# Patient Record
Sex: Female | Born: 1955 | Race: White | Hispanic: No | Smoking: Current every day smoker
Health system: Southern US, Community
[De-identification: ages and names within clinical notes are randomized; demographics above are authoritative.]

## PROBLEM LIST (undated history)

## (undated) DIAGNOSIS — I10 Essential (primary) hypertension: Secondary | ICD-10-CM

## (undated) DIAGNOSIS — T4145XA Adverse effect of unspecified anesthetic, initial encounter: Secondary | ICD-10-CM

## (undated) DIAGNOSIS — M1990 Unspecified osteoarthritis, unspecified site: Secondary | ICD-10-CM

## (undated) DIAGNOSIS — F32A Depression, unspecified: Secondary | ICD-10-CM

## (undated) DIAGNOSIS — F329 Major depressive disorder, single episode, unspecified: Secondary | ICD-10-CM

## (undated) DIAGNOSIS — F419 Anxiety disorder, unspecified: Secondary | ICD-10-CM

## (undated) DIAGNOSIS — R112 Nausea with vomiting, unspecified: Secondary | ICD-10-CM

## (undated) DIAGNOSIS — Z9889 Other specified postprocedural states: Secondary | ICD-10-CM

## (undated) DIAGNOSIS — T8859XA Other complications of anesthesia, initial encounter: Secondary | ICD-10-CM

## (undated) HISTORY — PX: KNEE SURGERY: SHX244

## (undated) HISTORY — PX: FOOT NEUROMA SURGERY: SHX646

---

## 2001-02-04 ENCOUNTER — Encounter (HOSPITAL_COMMUNITY): Admission: RE | Admit: 2001-02-04 | Discharge: 2001-03-06 | Payer: Self-pay | Admitting: Preventative Medicine

## 2001-02-26 ENCOUNTER — Encounter: Payer: Self-pay | Admitting: Family Medicine

## 2001-02-26 ENCOUNTER — Ambulatory Visit (HOSPITAL_COMMUNITY): Admission: RE | Admit: 2001-02-26 | Discharge: 2001-02-26 | Payer: Self-pay | Admitting: Family Medicine

## 2002-03-31 ENCOUNTER — Ambulatory Visit (HOSPITAL_COMMUNITY): Admission: RE | Admit: 2002-03-31 | Discharge: 2002-03-31 | Payer: Self-pay | Admitting: Obstetrics and Gynecology

## 2002-03-31 ENCOUNTER — Encounter: Payer: Self-pay | Admitting: Obstetrics and Gynecology

## 2002-05-29 ENCOUNTER — Other Ambulatory Visit: Admission: RE | Admit: 2002-05-29 | Discharge: 2002-05-29 | Payer: Self-pay | Admitting: Unknown Physician Specialty

## 2002-07-14 ENCOUNTER — Encounter: Payer: Self-pay | Admitting: Family Medicine

## 2002-07-14 ENCOUNTER — Ambulatory Visit (HOSPITAL_COMMUNITY): Admission: RE | Admit: 2002-07-14 | Discharge: 2002-07-14 | Payer: Self-pay | Admitting: Family Medicine

## 2005-11-16 ENCOUNTER — Ambulatory Visit (HOSPITAL_COMMUNITY): Admission: RE | Admit: 2005-11-16 | Discharge: 2005-11-16 | Payer: Self-pay | Admitting: Obstetrics and Gynecology

## 2006-01-08 ENCOUNTER — Ambulatory Visit (HOSPITAL_COMMUNITY): Admission: RE | Admit: 2006-01-08 | Discharge: 2006-01-08 | Payer: Self-pay | Admitting: Obstetrics & Gynecology

## 2006-03-28 ENCOUNTER — Ambulatory Visit (HOSPITAL_COMMUNITY): Admission: RE | Admit: 2006-03-28 | Discharge: 2006-03-28 | Payer: Self-pay | Admitting: Obstetrics & Gynecology

## 2006-03-28 ENCOUNTER — Encounter (INDEPENDENT_AMBULATORY_CARE_PROVIDER_SITE_OTHER): Payer: Self-pay | Admitting: Specialist

## 2006-08-31 ENCOUNTER — Encounter (INDEPENDENT_AMBULATORY_CARE_PROVIDER_SITE_OTHER): Payer: Self-pay | Admitting: Internal Medicine

## 2006-08-31 ENCOUNTER — Other Ambulatory Visit: Admission: RE | Admit: 2006-08-31 | Discharge: 2006-08-31 | Payer: Self-pay | Admitting: Internal Medicine

## 2006-12-14 ENCOUNTER — Ambulatory Visit (HOSPITAL_COMMUNITY): Admission: RE | Admit: 2006-12-14 | Discharge: 2006-12-14 | Payer: Self-pay | Admitting: Internal Medicine

## 2006-12-14 ENCOUNTER — Encounter: Payer: Self-pay | Admitting: Orthopedic Surgery

## 2006-12-21 ENCOUNTER — Ambulatory Visit (HOSPITAL_COMMUNITY): Admission: RE | Admit: 2006-12-21 | Discharge: 2006-12-21 | Payer: Self-pay | Admitting: Obstetrics and Gynecology

## 2007-01-17 ENCOUNTER — Ambulatory Visit: Payer: Self-pay | Admitting: Orthopedic Surgery

## 2007-01-17 DIAGNOSIS — IMO0002 Reserved for concepts with insufficient information to code with codable children: Secondary | ICD-10-CM | POA: Insufficient documentation

## 2007-01-17 DIAGNOSIS — M1710 Unilateral primary osteoarthritis, unspecified knee: Secondary | ICD-10-CM | POA: Insufficient documentation

## 2007-01-17 DIAGNOSIS — Z8679 Personal history of other diseases of the circulatory system: Secondary | ICD-10-CM | POA: Insufficient documentation

## 2007-07-18 ENCOUNTER — Inpatient Hospital Stay (HOSPITAL_COMMUNITY): Admission: EM | Admit: 2007-07-18 | Discharge: 2007-07-19 | Payer: Self-pay | Admitting: Emergency Medicine

## 2008-04-10 ENCOUNTER — Other Ambulatory Visit: Admission: RE | Admit: 2008-04-10 | Discharge: 2008-04-10 | Payer: Self-pay | Admitting: Obstetrics and Gynecology

## 2009-05-19 ENCOUNTER — Ambulatory Visit (HOSPITAL_COMMUNITY): Admission: RE | Admit: 2009-05-19 | Discharge: 2009-05-19 | Payer: Self-pay | Admitting: Orthopaedic Surgery

## 2009-06-02 ENCOUNTER — Other Ambulatory Visit: Admission: RE | Admit: 2009-06-02 | Discharge: 2009-06-02 | Payer: Self-pay | Admitting: Obstetrics and Gynecology

## 2010-02-27 ENCOUNTER — Encounter: Payer: Self-pay | Admitting: Obstetrics and Gynecology

## 2010-06-24 NOTE — Op Note (Signed)
[name], [name]           ACCOUNT NO.:  [id number]   MEDICAL RECORD NO.:  [id number]          PATIENT TYPE:  AMB   LOCATION:  DAY                           FACILITY:  [facility]   PHYSICIAN:  Lazaro Arms, M.D.   DATE OF BIRTH:  [birth year]   DATE OF PROCEDURE:  03/28/2006  DATE OF DISCHARGE:                               OPERATIVE REPORT   PREOPERATIVE DIAGNOSES:  1. Menometrorrhagia.  2. Dysmenorrhea.   POSTOPERATIVE DIAGNOSES:  1. Menometrorrhagia.  2. Dysmenorrhea.   PROCEDURE:  Hysteroscopy, dilatation and curettage, __________ ablation.   SURGEON:  Lazaro Arms, M.D.   ANESTHESIA:  Saddle block anesthesia.   FINDINGS:  The patient had some rough endometrial tissue.  It was maybe  small submucosal myomas, very small.  Maybe polypoid tissue, but it was  almost fibrotic.  Otherwise the endometrial cavity was normal.   DESCRIPTION OF PROCEDURE:  The patient was taken to the operating room  and placed in the sitting position where she underwent a saddle block.  She was placed in the dorsal lithotomy position and prepped and draped  in the usual sterile fashion.  A Graves speculum was placed.  The cervix  was grasped with a single-tooth tenaculum.  Her cervix was dilated  serially to allow passage of the hysteroscope.  A hysteroscopy was  performed with the above-noted findings.  A vigorous uterine curettage  was performed and all the endometrial curettings were sent to pathology  for evaluation. There was good endometrial cry at the end of the  procedure.  A ThermaChoice-3 endometrial ablation balloon was used and 9  mL of D-5-W was required to maintain a pressure of 192 mmHg.  It was  heated to 88 degrees Celsius.  The total therapy time was eight minutes  and 55 seconds.  All the fluid was returned at the end of the procedure.   The patient tolerated the procedure well. She experienced minimal blood  loss.  She was taken to the recovery room in good stable  condition.  All  counts were correct.     Lazaro Arms, M.D.  Electronically Signed    LHE/MEDQ  D:  03/28/2006  T:  03/28/2006  Job:  [id number]

## 2010-06-24 NOTE — Discharge Summary (Signed)
[name], [name]           ACCOUNT NO.:  [id number]   MEDICAL RECORD NO.:  [id number]          PATIENT TYPE:  INP   LOCATION:  3703                         FACILITY:  [facility]   PHYSICIAN:  Dani Gobble, MD       DATE OF BIRTH:  10/04/1955   DATE OF ADMISSION:  07/18/2007  DATE OF DISCHARGE:  07/19/2007                               DISCHARGE SUMMARY   PRIMARY CARE Jarelle Ates:  Dr. Nani Skillern.   DISCHARGE DIAGNOSES:  1. Chest pain felt to be noncardiac in origin.  2. Hypertension.  3. Chronic obstructive pulmonary disease.  4. Tobacco use.  5. Degenerative joint disease.   DISCHARGE MEDICATIONS:  1. Lopressor 25 mg p.o. b.i.d.  2. Norvasc 5 mg p.o. daily.  3. Protonix 40 mg p.o. daily.  4. Xanax p.r.n. as directed.  5. Aspirin 81 mg daily.  6. Nitroglycerin sublingual p.r.n.   HISTORY OF PRESENT ILLNESS:  For full details, please refer to separate  admission history and physical examination.  Briefly, the patient is a  55 year old Caucasian female without prior history of coronary artery  disease.  She was referred by Dr. Rolm Baptise office for evaluation of  substernal chest pain/tightness for several days.  Her pain was  primarily after eating, had been located in the epigastric area with  associated nausea and vomiting, but no other associated signs or  symptoms.   HOSPITAL COURSE:  The patient was admitted to Cardiac Telemetry where  she did rule out for myocardial infarction.  She did remain pain-free.  Given the possible gastrointestinal etiology of her symptoms, abdominal  ultrasound was obtained to rule out cholecystitis.  There was no acute  abnormality.  There was simple cyst at the upper pole of the left kidney  and diffuse fatty infiltrate of the liver.  The patient remained stable  during her hospital course.  It was felt since she is ruled out for  myocardial infarction, her symptoms were atypical, and she had no prior  cardiac history that she could be  discharged home with outpatient  followup for nuclear stress test.  This was discussed with the patient,  she was in agreement.   LABORATORY VALUES DURING HOSPITAL COURSE:  Cardiac enzymes negative x4  sets.  TSH 1.889.  Urinalysis was negative.  Lipase 23 and amylase 62.  Metabolic panel with sodium 141, potassium 4.0, chloride 107, CO2 of 26,  glucose 104, BUN 9, and creatinine 0.65.  CBC with hemoglobin 13.3,  hematocrit 38.3, WBC 8.7, and platelets 304.   DISCHARGE INSTRUCTIONS:  The patient was given instructions regarding  her medication adjustments as made above.  She has been also instructed  to return to the emergency department as needed for any further chest  pain or complaints.  She will follow up in our office with Dr. Domingo Sep  in [place] and will also have a stress test  completed on July 25, 2007, at 8:30 a.m.  She will follow up with her  primary care Manuel Dall.  I recommended absolute tobacco cessation.   CONDITION ON DISCHARGE:  Stable. Improved.      Kandis Mannan, PA  ______________________________  Dani Gobble, MD    ALT/MEDQ  D:  08/16/2007  T:  08/17/2007  Job:  [id number]   cc:   Dr. Nani Skillern

## 2010-11-03 LAB — URINALYSIS, ROUTINE W REFLEX MICROSCOPIC
Glucose, UA: NEGATIVE
Hgb urine dipstick: NEGATIVE
Protein, ur: NEGATIVE
Specific Gravity, Urine: 1.008
Urobilinogen, UA: 0.2

## 2010-11-03 LAB — PROTIME-INR
INR: 1
Prothrombin Time: 13.1

## 2010-11-03 LAB — CK TOTAL AND CKMB (NOT AT ARMC)
CK, MB: 0.7
Relative Index: INVALID
Relative Index: INVALID
Total CK: 37
Total CK: 51

## 2010-11-03 LAB — COMPREHENSIVE METABOLIC PANEL
ALT: 13
AST: 13
Alkaline Phosphatase: 66
BUN: 9
Calcium: 9.2
Creatinine, Ser: 0.65
GFR calc Af Amer: >60
Potassium: 4
Sodium: 141
Total Bilirubin: 0.5

## 2010-11-03 LAB — CBC
HCT: 38.3
Hemoglobin: 13.3
MCHC: 34.6
MCV: 91.2
Platelets: 304
RBC: 4.21
RDW: 12.4

## 2010-11-03 LAB — POCT CARDIAC MARKERS: CKMB, poc: 1.1

## 2010-11-03 LAB — DIFFERENTIAL
Eosinophils Relative: 2
Lymphocytes Relative: 34
Neutro Abs: 5.3

## 2010-11-03 LAB — LIPASE, BLOOD: Lipase: 23

## 2010-11-03 LAB — TSH: TSH: 1.889

## 2010-11-03 LAB — APTT: aPTT: 27

## 2012-02-29 ENCOUNTER — Encounter (HOSPITAL_COMMUNITY): Payer: Self-pay | Admitting: Emergency Medicine

## 2012-02-29 ENCOUNTER — Emergency Department (HOSPITAL_COMMUNITY)
Admission: EM | Admit: 2012-02-29 | Discharge: 2012-02-29 | Disposition: A | Discharge status: Home / Self Care | Payer: Worker's Compensation | Attending: Emergency Medicine | Admitting: Emergency Medicine

## 2012-02-29 ENCOUNTER — Emergency Department (HOSPITAL_COMMUNITY): Payer: Worker's Compensation

## 2012-02-29 DIAGNOSIS — I10 Essential (primary) hypertension: Secondary | ICD-10-CM | POA: Insufficient documentation

## 2012-02-29 DIAGNOSIS — F17200 Nicotine dependence, unspecified, uncomplicated: Secondary | ICD-10-CM | POA: Insufficient documentation

## 2012-02-29 DIAGNOSIS — Y9289 Other specified places as the place of occurrence of the external cause: Secondary | ICD-10-CM | POA: Insufficient documentation

## 2012-02-29 DIAGNOSIS — W208XXA Other cause of strike by thrown, projected or falling object, initial encounter: Secondary | ICD-10-CM | POA: Insufficient documentation

## 2012-02-29 DIAGNOSIS — Y939 Activity, unspecified: Secondary | ICD-10-CM | POA: Insufficient documentation

## 2012-02-29 DIAGNOSIS — Z79899 Other long term (current) drug therapy: Secondary | ICD-10-CM | POA: Insufficient documentation

## 2012-02-29 DIAGNOSIS — S8000XA Contusion of unspecified knee, initial encounter: Secondary | ICD-10-CM

## 2012-02-29 HISTORY — DX: Essential (primary) hypertension: I10

## 2012-02-29 MED ORDER — OXYCODONE-ACETAMINOPHEN 5-325 MG PO TABS: 1.0000 | ORAL_TABLET | Freq: Four times a day (QID) | ORAL | Status: AC | PRN

## 2012-02-29 MED ORDER — HYDROCODONE-ACETAMINOPHEN 5-325 MG PO TABS: 1.0000 | ORAL_TABLET | Freq: Four times a day (QID) | ORAL | Status: DC | PRN

## 2012-02-29 MED ORDER — IBUPROFEN 800 MG PO TABS: 800.0000 mg | ORAL_TABLET | Freq: Three times a day (TID) | ORAL | Status: DC

## 2012-02-29 NOTE — ED Notes (Signed)
Pt c/o left leg pain after several plastic totes fell on it at work this am.

## 2012-02-29 NOTE — ED Provider Notes (Signed)
History   This chart was scribed for Benny Lennert, MD, by Frederik Pear, ER scribe. The patient was seen in room [facility] and the patient's care was started at 0920.    CSN: [id number]  Arrival date & time 02/29/12  0906   First MD Initiated Contact with Patient 02/29/12 0920      Chief Complaint  Patient presents with  . Leg Pain    (Consider location/radiation/quality/duration/timing/severity/associated sxs/prior treatment) Patient is a 57 y.o. female presenting with leg pain. The history is provided by the patient. No language interpreter was used.  Leg Pain  The incident occurred less than 1 hour ago. The incident occurred at work. The pain is present in the left leg. The pain is moderate. The symptoms are aggravated by bearing weight.    [name] is a 57 y.o. female who presents to the Emergency Department complaining of sudden onset, moderate, constant left lower leg pain that is aggravated by bearing weight that began after several plastic totes of merchandise fell on her leg while she was at work [employer].   Past Medical History  Diagnosis Date  . Hypertension     Past Surgical History  Procedure Date  . Knee surgery     No family history on file.  History  Substance Use Topics  . Smoking status: Current Every Day Smoker -- 0.5 packs/day  . Smokeless tobacco: Not on file  . Alcohol Use: Yes     Comment: occasional    OB History    Grav Para Term Preterm Abortions TAB SAB Ect Mult Living                  Review of Systems  Constitutional: Negative for fatigue.  HENT: Negative for congestion, sinus pressure and ear discharge.   Eyes: Negative for discharge.  Respiratory: Negative for cough.   Cardiovascular: Negative for chest pain.  Gastrointestinal: Negative for abdominal pain and diarrhea.  Genitourinary: Negative for frequency and hematuria.  Musculoskeletal: Negative for back pain.       Leg pain.  Skin: Negative for rash.    Neurological: Negative for seizures and headaches.  Hematological: Negative.   Psychiatric/Behavioral: Negative for hallucinations.  All other systems reviewed and are negative.    Allergies  Review of patient's allergies indicates no known allergies.  Home Medications   Current Outpatient Rx  Name  Route  Sig  Dispense  Refill  . ALPRAZOLAM 1 MG PO TABS   Oral   Take 1 mg by mouth 4 (four) times daily as needed. Sleep, Anxiety         . AMLODIPINE BESYLATE 5 MG PO TABS   Oral   Take 5 mg by mouth daily.         . B COMPLEX PO TABS   Oral   Take 1 tablet by mouth daily.         Marland Kitchen CITALOPRAM HYDROBROMIDE 20 MG PO TABS   Oral   Take 20 mg by mouth daily.         Marland Kitchen METOPROLOL TARTRATE 25 MG PO TABS   Oral   Take 25 mg by mouth 2 (two) times daily.         Marland Kitchen NAPROXEN SODIUM 220 MG PO TABS   Oral   Take 220 mg by mouth 2 (two) times daily as needed. Arthritis         . ZOLPIDEM TARTRATE 10 MG PO TABS   Oral   Take  10 mg by mouth at bedtime as needed. Sleep           BP 141/84  Pulse 76  Temp 98 F (36.7 C)  Resp 18  Ht 5\' 1"  (1.549 m)  Wt 174 lb (78.926 kg)  BMI 32.88 kg/m2  SpO2 97%  Physical Exam  Nursing note and vitals reviewed. Constitutional: She is oriented to person, place, and time. She appears well-developed.  HENT:  Head: Normocephalic.  Eyes: Conjunctivae normal are normal.  Neck: No tracheal deviation present.  Cardiovascular:  No murmur heard. Musculoskeletal: Normal range of motion.       She has ecchymosis over her lateral tibia and fibula.  Neurological: She is oriented to person, place, and time.  Skin: Skin is warm.  Psychiatric: She has a normal mood and affect.    ED Course  Procedures (including critical care time)  DIAGNOSTIC STUDIES: Oxygen Saturation is 97% on room air, adequate by my interpretation.    COORDINATION OF CARE:  09:44- Discussed planned course of treatment with the patient, including a left  tibia/fibula X-ray, who is agreeable at this time.  Labs Reviewed - No data to display Dg Tibia/fibula Left  02/29/2012  *RADIOLOGY REPORT*  Clinical Data: History of trauma complaining of pain in the left lower leg.  LEFT TIBIA AND FIBULA - 2 VIEW  Comparison: No priors.  Findings: AP lateral views of the left tibia and fibula demonstrate no acute displaced fracture or soft tissue abnormality.  IMPRESSION: 1.  No acute radiographic abnormality of the left tibia or fibula.   Original Report Authenticated By: Trudie Reed, M.D.      No diagnosis found.    MDM   The chart was scribed for me under my direct supervision.  I personally performed the history, physical, and medical decision making and all procedures in the evaluation of this patient.Benny Lennert, MD 02/29/12 1105

## 2012-02-29 NOTE — ED Notes (Signed)
Bruising to left lower lateral leg after totes fell on the leg

## 2012-09-12 ENCOUNTER — Other Ambulatory Visit: Payer: Self-pay | Admitting: Adult Health

## 2012-11-11 ENCOUNTER — Other Ambulatory Visit: Payer: Self-pay | Admitting: Adult Health

## 2013-01-07 ENCOUNTER — Other Ambulatory Visit: Payer: Self-pay | Admitting: Adult Health

## 2014-11-15 IMAGING — CR DG TIBIA/FIBULA 2V*L*
2 series · 2 of 2 positions shown · non-contrast
Comparison: No priors.

CLINICAL DATA: History of trauma complaining of pain in the left
lower leg.

LEFT TIBIA AND FIBULA - 2 VIEW

[view not recorded (1 of 2)]
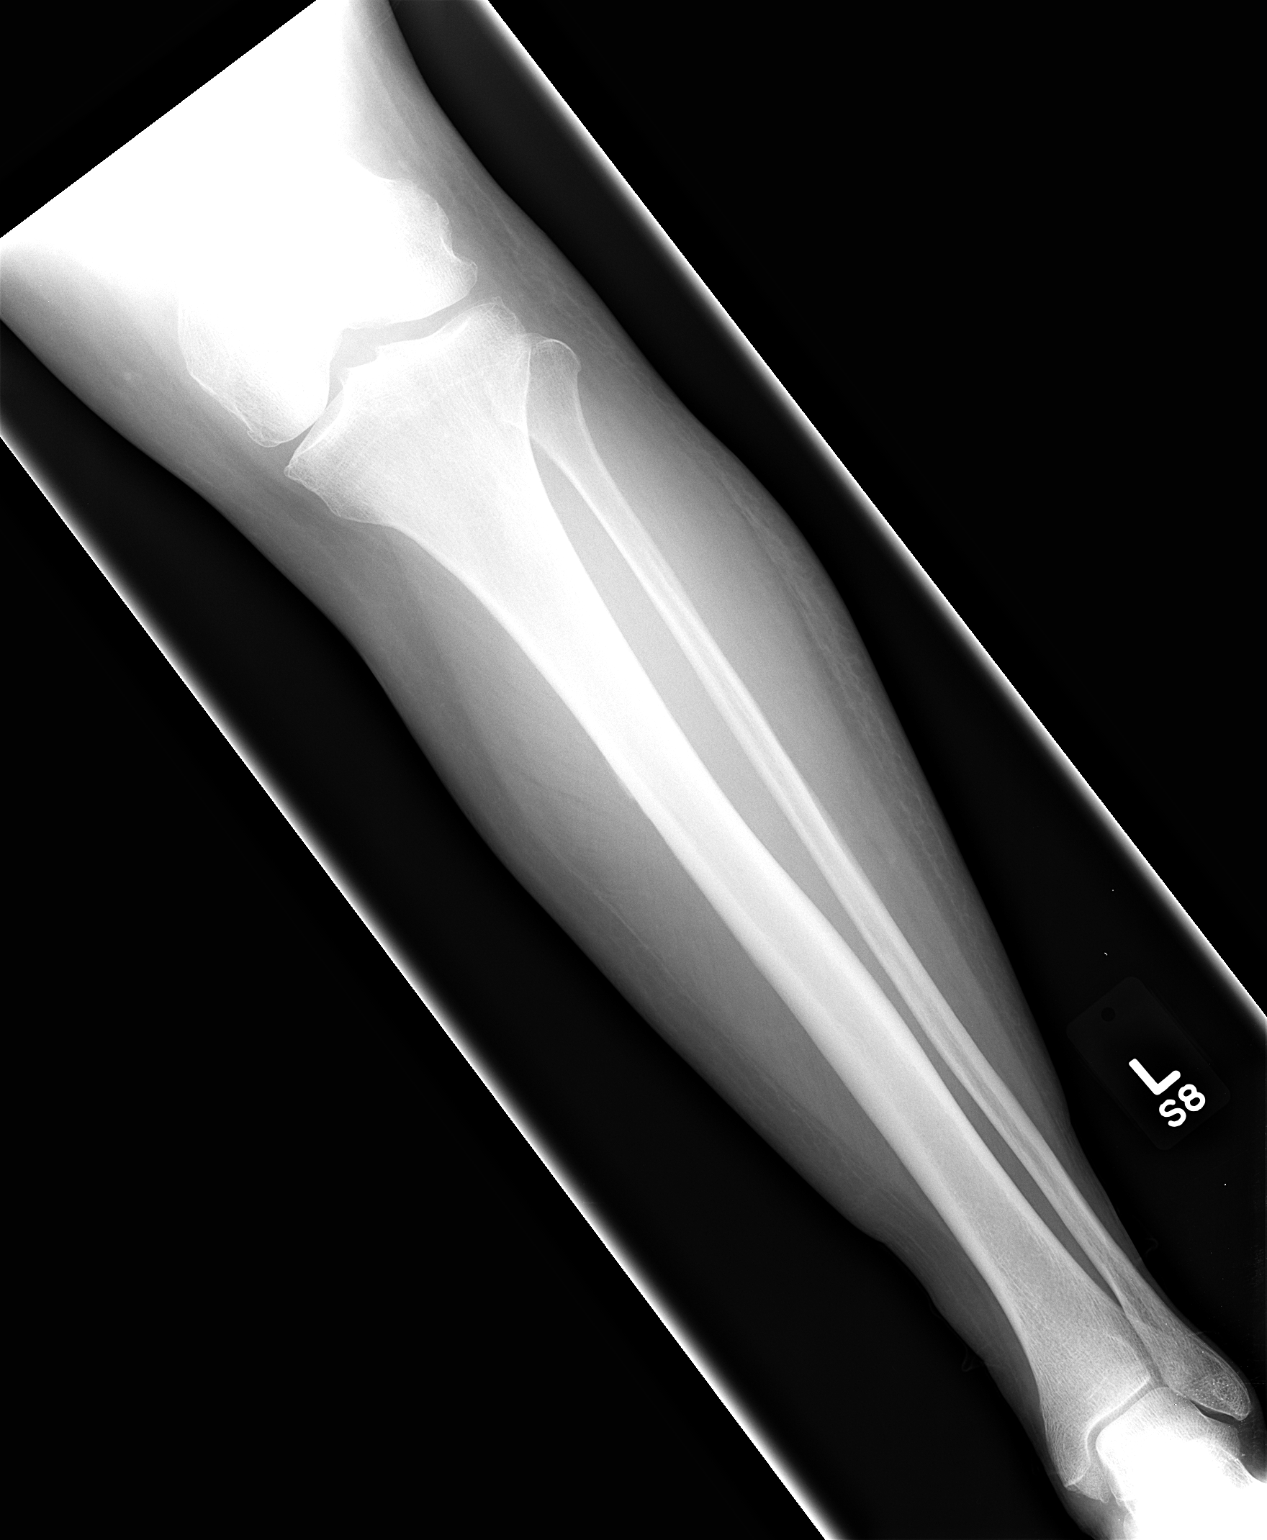

[view not recorded (2 of 2)]
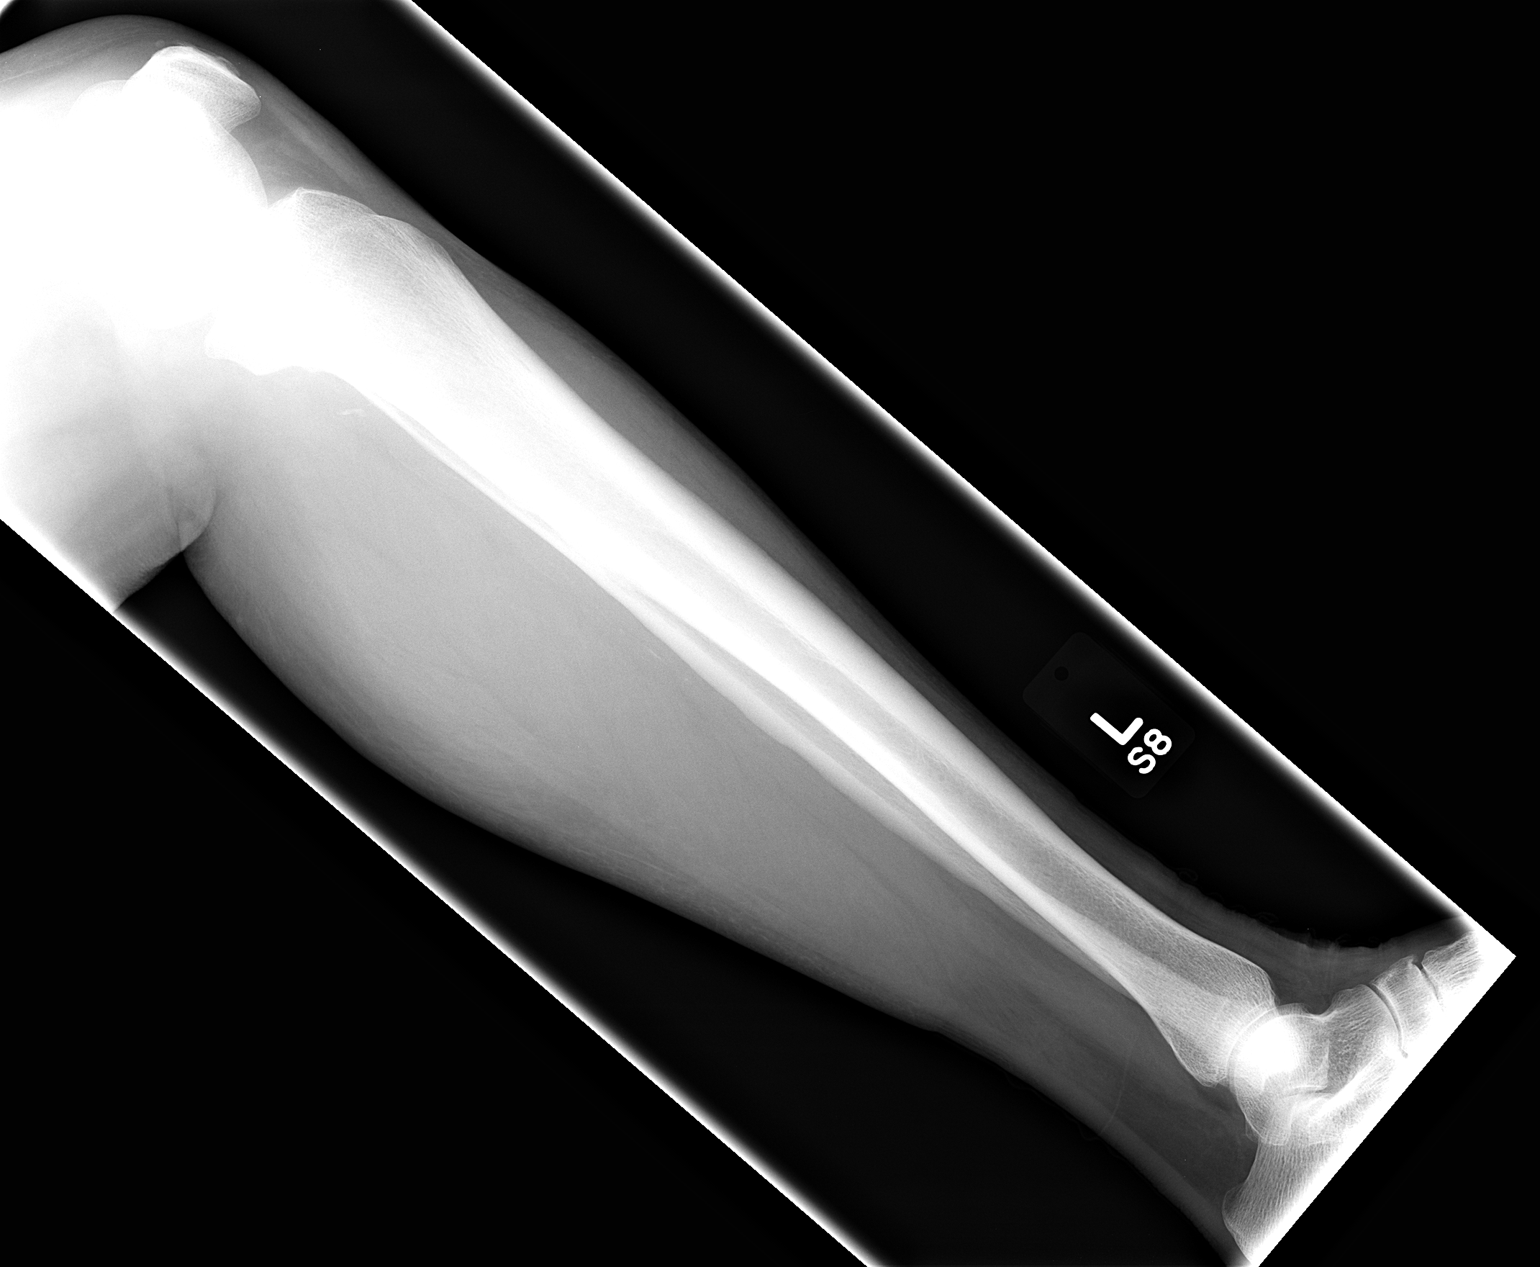

[2 of 2 positions shown; findings below may reference images not displayed]

FINDINGS: AP lateral views of the left tibia and fibula demonstrate
no acute displaced fracture or soft tissue abnormality.
IMPRESSION: 1.  No acute radiographic abnormality of the left tibia or fibula.

## 2016-11-13 NOTE — Progress Notes (Signed)
Psychiatric Initial Adult Assessment   Patient Identification: [name] MRN:  [id number] Date of Evaluation:  11/15/2016 Referral Source: [facility] Cl[facility] Chief Complaint:  "My doctor did not want to prescribe Xanax" Visit Diagnosis:    ICD-10-CM   1. MDD (major depressive disorder), recurrent episode, moderate (HCC) F33.1     History of Present Illness:   [name] is a 61 year old female with anxiety, COPD, hypertension, who is referred for anxiety.  Reviewed chart from Dr. Sherryll Burger. Labs not available.   Patient states that she is here as her doctor has concern for prescribing Xanax. She used to see Dr. Loney Hering for 10 years. She also wanted to talk with somebody given she went through; she lost her husband of 23 years from cancer. He deceased in 04-17-16. She also reports that her son, age 73 received a  17 year of sentence due to robbing a drug from dealer. She is also frustrated with interaction with her boss, who recently joined the practice. She has been frustrated from these issues and noticed she does not have any motivation to do anything. She states that she is out of work for the past few days for fatigue.   She reports fair sleep. She has anhedonia and no motivation, although she usually feels better when she is with her grandchild or has visit of her niece. She denies SI. She feels anxious. She denies panic attacks. She drinks once time on her birthday over the six months. She denies drug use. She has been on Xanax for 30 years; she has not tried to taper down before.   Per PMP Xanax 1 mg 120 tabs for 30 days, last on 10/26/2016 I have utilized the [place] Controlled Substances Reporting System to confirm adherence regarding the patient's medication. My review reveals appropriate prescription fills.   Associated Signs/Symptoms: Depression Symptoms:  depressed mood, anhedonia, anxiety, (Hypo) Manic Symptoms:  denies Anxiety Symptoms:  denies Psychotic Symptoms:   denies PTSD Symptoms: NA  Past Psychiatric History:  Outpatient: denies (was treated for depression by PCP) Psychiatry admission: denies Previous suicide attempt: denies Past trials of medication: sertraline, fluoxetine (became emotional), Abilify History of violence: denies  Previous Psychotropic Medications: Yes   Substance Abuse History in the last 12 months:  No.  Consequences of Substance Abuse: NA  Past Medical History:  Past Medical History:  Diagnosis Date  . Hypertension     Past Surgical History:  Procedure Laterality Date  . KNEE SURGERY      Family Psychiatric History:  Son- drug use  Family History: No family history on file.  Social History:   Social History   Social History  . Marital status: Married    Spouse name: N/A  . Number of children: N/A  . Years of education: N/A   Social History Main Topics  . Smoking status: Current Every Day Smoker    Packs/day: 0.75  . Smokeless tobacco: Never Used  . Alcohol use Yes     Comment: occasional. 11-15-2016 occa  . Drug use: No     Comment: 11-15-2016 per pt no  . Sexual activity: Not Asked   Other Topics Concern  . None   Social History Narrative  . None    Additional Social History:  Education: graduated from high school Work: Social research officer, government for seven years  She grew up in [place], reports good relationship with her parents  Allergies:  No Known Allergies  Metabolic Disorder Labs: No results found for:  HGBA1C, MPG No results found for: PROLACTIN No results found for: CHOL, TRIG, HDL, CHOLHDL, VLDL, LDLCALC   Current Medications: Current Outpatient Prescriptions  Medication Sig Dispense Refill  . amLODipine (NORVASC) 5 MG tablet Take 5 mg by mouth daily.    . ARIPiprazole (ABILIFY) 10 MG tablet Take 1 tablet (10 mg total) by mouth daily. 30 tablet 1  . celecoxib (CELEBREX) 100 MG capsule Take 100 mg by mouth 2 (two) times daily.    . metoprolol tartrate (LOPRESSOR) 25 MG tablet  Take 25 mg by mouth 2 (two) times daily.    . naproxen sodium (ALEVE) 220 MG tablet Take 220 mg by mouth 2 (two) times daily as needed. Arthritis    . escitalopram (LEXAPRO) 20 MG tablet Start 10 mg daily for one week, then 20 mg daily 30 tablet 1  . LORazepam (ATIVAN) 1 MG tablet Take 1 tablet (1 mg total) by mouth every 4 (four) hours as needed for anxiety. 120 tablet 0   No current facility-administered medications for this visit.    8 mg   Neurologic: Headache: No Seizure: No Paresthesias:No  Musculoskeletal: Strength & Muscle Tone: within normal limits Gait & Station: normal Patient leans: N/A  Psychiatric Specialty Exam: Review of Systems  Psychiatric/Behavioral: Positive for depression. Negative for hallucinations, substance abuse and suicidal ideas. The patient is nervous/anxious. The patient does not have insomnia.   All other systems reviewed and are negative.   Blood pressure 128/78, pulse 83, height  (1.549 m), weight 153 lb (69.4 kg).Body mass index is 28.91 kg/m.  General Appearance: Fairly Groomed  Eye Contact:  Good  Speech:  Clear and Coherent  Volume:  Normal  Mood:  Anxious and Depressed  Affect:  Appropriate, Congruent and tense- later improved a little and smiles at times  Thought Process:  Coherent and Goal Directed  Orientation:  Full (Time, Place, and Person)  Thought Content:  Logical Perceptions: denies AH/VH  Suicidal Thoughts:  No  Homicidal Thoughts:  No  Memory:  Immediate;   Good Recent;   Good Remote;   Good  Judgement:  Good  Insight:  Fair  Psychomotor Activity:  Increased slightly, shaking her knee  Concentration:  Concentration: Good and Attention Span: Good  Recall:  Good  Fund of Knowledge:Good  Language: Good  Akathisia:  No  Handed:  Right  AIMS (if indicated):  No tremors  Assets:  Communication Skills Desire for Improvement  ADL's:  Intact  Cognition: WNL  Sleep:  fair   Assessment [name] is a 61 year  old female with anxiety, COPD, hypertension, who is referred for anxiety.   # MDD, moderate, recurrent without psychotic features Patient endorses neurovegetative symptoms in the setting of loss of her husband, her son received a sentence and discordance with her boss at work. Will cross taper from sertraline to Lexapro given limited benefit from sertraline. Discussed risk of serotonin syndrome. Will continue Abilify at this time for adjunctive treatment for depression. Will switch from Xanax to lorazepam to avoid risk of dependence and physical tolerance with hope to taper it off in the future. Validated her grief. She will greatly benefit from supportive therapy; will make a referral.    Plan 1. Start Lexapro 10 mg daily for one week, then 20 mg daily  2. Decrease sertraline 100 mg daily for one week, then 50 mg daily for one week, then discontinue 3. Start lorazepam 1 mg up to four times a day as needed for  anxiety (instructed to first try three times a day as needed for anxiety) 4. Discontinue Xanax 5. Continue Abilify 10 mg daily  6. Referral to therapy  7. Return to clinic in one month for 30 mins - may obtain TSH at the next encounter  The patient demonstrates the following risk factors for suicide: Chronic risk factors for suicide include: psychiatric disorder of depression, anxiety . Acute risk factors for suicide include: loss (financial, interpersonal, professional). Protective factors for this patient include: positive social support, responsibility to others (children, family), coping skills and hope for the future. Considering these factors, the overall suicide risk at this point appears to be low. Patient is appropriate for outpatient follow up.   Treatment Plan Summary: Plan as above   Neysa Hotter, MD 10/10/20183:55 PM

## 2016-11-15 ENCOUNTER — Ambulatory Visit (INDEPENDENT_AMBULATORY_CARE_PROVIDER_SITE_OTHER): Payer: 59 | Admitting: Psychiatry

## 2016-11-15 ENCOUNTER — Encounter (HOSPITAL_COMMUNITY): Payer: Self-pay | Admitting: Psychiatry

## 2016-11-15 VITALS — BP 128/78 | HR 83 | Ht 61.0 in | Wt 153.0 lb

## 2016-11-15 DIAGNOSIS — F331 Major depressive disorder, recurrent, moderate: Secondary | ICD-10-CM

## 2016-11-15 DIAGNOSIS — Z79899 Other long term (current) drug therapy: Secondary | ICD-10-CM

## 2016-11-15 DIAGNOSIS — J449 Chronic obstructive pulmonary disease, unspecified: Secondary | ICD-10-CM

## 2016-11-15 DIAGNOSIS — I10 Essential (primary) hypertension: Secondary | ICD-10-CM

## 2016-11-15 DIAGNOSIS — F419 Anxiety disorder, unspecified: Secondary | ICD-10-CM

## 2016-11-15 DIAGNOSIS — F17200 Nicotine dependence, unspecified, uncomplicated: Secondary | ICD-10-CM

## 2016-11-15 MED ORDER — ESCITALOPRAM OXALATE 20 MG PO TABS: ORAL_TABLET | ORAL | 1 refills | Status: DC

## 2016-11-15 MED ORDER — LORAZEPAM 1 MG PO TABS: 1.0000 mg | ORAL_TABLET | ORAL | 0 refills | Status: DC | PRN

## 2016-11-15 MED ORDER — ARIPIPRAZOLE 10 MG PO TABS: 10.0000 mg | ORAL_TABLET | Freq: Every day | ORAL | 1 refills | Status: DC

## 2016-11-15 NOTE — Patient Instructions (Signed)
1. Start lexapro 10 mg daily for one week, then 20 mg daily  2. Decrease sertraline 100 mg daily for one week, then 50 mg daily for one week, then discontinue 3. Start lorazepam 1 mg up to four times a day as needed for anxiety  4. Discontinue Xanax 5. Continue Abilify 10 mg daily  6. Referral to therapy  7. Return to clinic in one month for 30 mins

## 2016-11-16 ENCOUNTER — Telehealth (HOSPITAL_COMMUNITY): Payer: Self-pay | Admitting: *Deleted

## 2016-11-16 NOTE — Telephone Encounter (Signed)
Per pt call, her insurance is needing a PA on her Ativan. Per pt her insurance will not pay for 4 times a day. Per pt the pharmacy wanted to ask the provider to see if pt could get dropped down to 3 times a day to see if it will work. Informed pt that message will be sent to provider and the lady that helps office with PA. Pt verbalized understanding. Pt pharmacy is [facility] in [place] and pt number is [phone].

## 2016-11-16 NOTE — Telephone Encounter (Signed)
If patient feels comfortable with it, we can try three times a day.

## 2016-11-17 NOTE — Telephone Encounter (Signed)
Called pt at [phone] to inform her with what provider stated and was unable to reach pt. Staff lmtcb and office number provided.

## 2016-11-20 ENCOUNTER — Other Ambulatory Visit (HOSPITAL_COMMUNITY): Payer: Self-pay | Admitting: Psychiatry

## 2016-11-20 MED ORDER — LORAZEPAM 1 MG PO TABS: 1.0000 mg | ORAL_TABLET | Freq: Three times a day (TID) | ORAL | 0 refills | Status: DC | PRN

## 2016-11-20 NOTE — Telephone Encounter (Signed)
Dicussed with a pharmacy. Ordered for 90 tabs with no refill.

## 2016-11-20 NOTE — Telephone Encounter (Signed)
Spoke with pt and informed her with information from provider. Per pt she needs to have Dr. Vanetta Shawl to send script to her pharmacy for TID to try it.

## 2016-11-22 NOTE — Telephone Encounter (Signed)
noted 

## 2016-12-06 ENCOUNTER — Ambulatory Visit (INDEPENDENT_AMBULATORY_CARE_PROVIDER_SITE_OTHER): Payer: 59 | Admitting: Licensed Clinical Social Worker

## 2016-12-06 DIAGNOSIS — F331 Major depressive disorder, recurrent, moderate: Secondary | ICD-10-CM | POA: Diagnosis not present
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
## 2016-12-12 NOTE — Progress Notes (Signed)
[facility] MD/PA/NP OP Progress Note  12/14/2016 4:29 PM [name]  MRN:  [id number]  Chief Complaint:  Chief Complaint    Depression; Anxiety     HPI:  Patient presents for follow-up appointment for depression. She states that she is not feeling great.  She wants to be back on Xanax as she used to feel better.  She states that she feel tired after taking Ativan; she takes it twice a day. She denies any side effect from lexapro and denies any concern about this medication. She tends to think about her husband who is deceased, although she denies it has become intense. She goes to "work and sleep." Although she went to a birthday party for niece, she left the place soon after as she does not like to be in crowded place. She watches TV at home when she is off work. She has insomnia with night time awakening. She feels fatigue and depressed. She feels irritable at times. She has anhedonia. She denies SI. She denies panic attacks.   Per PMP,  Ativan 1 mg 90 tabs for 30 days, 11/20/2016 I have utilized the [place] Controlled Substances Reporting System (PMP AWARxE) to confirm adherence regarding the patient's medication. My review reveals appropriate prescription fills.   Wt Readings from Last 3 Encounters:  12/14/16 158 lb (71.7 kg)  11/15/16 153 lb (69.4 kg)  02/29/12 174 lb (78.9 kg)    Visit Diagnosis:    ICD-10-CM   1. MDD (major depressive disorder), recurrent episode, moderate (HCC) F33.1     Past Psychiatric History:  I have reviewed the patient's psychiatry history in detail and updated the patient record. Outpatient: denies (was treated for depression by PCP) Psychiatry admission: denies Previous suicide attempt: denies Past trials of medication: sertraline, fluoxetine (became emotional), Abilify History of violence: denies  Past Medical History:  Past Medical History:  Diagnosis Date  . Hypertension     Past Surgical History:  Procedure Laterality Date  . KNEE SURGERY       Family Psychiatric History:  I have reviewed the patient's family history in detail and updated the patient record. Son- drug use  Family History:  Family History  Problem Relation Age of Onset  . Drug abuse Son     Social History:  Social History   Socioeconomic History  . Marital status: Married    Spouse name: None  . Number of children: None  . Years of education: None  . Highest education level: None  Social Needs  . Financial resource strain: None  . Food insecurity - worry: None  . Food insecurity - inability: None  . Transportation needs - medical: None  . Transportation needs - non-medical: None  Occupational History  . None  Tobacco Use  . Smoking status: Current Every Day Smoker    Packs/day: 0.75  . Smokeless tobacco: Never Used  Substance and Sexual Activity  . Alcohol use: Yes    Comment: occasional. 11-15-2016 occa  . Drug use: No    Comment: 11-15-2016 per pt no  . Sexual activity: None  Other Topics Concern  . None  Social History Narrative  . None    Allergies: No Known Allergies  Metabolic Disorder Labs: No results found for: HGBA1C, MPG No results found for: PROLACTIN No results found for: CHOL, TRIG, HDL, CHOLHDL, VLDL, LDLCALC   Therapeutic Level Labs: No results found for: LITHIUM No results found for: VALPROATE No components found for:  CBMZ  Current Medications: Current Outpatient Medications  Medication Sig Dispense Refill  . amLODipine (NORVASC) 5 MG tablet Take 5 mg by mouth daily.    . ARIPiprazole (ABILIFY) 5 MG tablet Take 1 tablet (5 mg total) daily by mouth. 30 tablet 0  . celecoxib (CELEBREX) 100 MG capsule Take 100 mg by mouth 2 (two) times daily.    Marland Kitchen. LORazepam (ATIVAN) 1 MG tablet Take 1 tablet (1 mg total) 2 (two) times daily by mouth. 60 tablet 0  . metoprolol tartrate (LOPRESSOR) 25 MG tablet Take 25 mg by mouth 2 (two) times daily.    . naproxen sodium (ALEVE) 220 MG tablet Take 220 mg by mouth 2 (two)  times daily as needed. Arthritis    . escitalopram (LEXAPRO) 20 MG tablet Start 10 mg daily for one week, then 20 mg daily (Patient not taking: Reported on 12/14/2016) 30 tablet 1   No current facility-administered medications for this visit.      Musculoskeletal: Strength & Muscle Tone: within normal limits Gait & Station: normal Patient leans: N/A  Psychiatric Specialty Exam: ROS  Blood pressure 106/80, pulse 72, height 5\' 1"  (1.549 m), weight 158 lb (71.7 kg).Body mass index is 29.85 kg/m.  General Appearance: Fairly Groomed  Eye Contact:  Good  Speech:  Clear and Coherent  Volume:  Normal  Mood:  "not good"  Affect:  Restricted and slightly tense  Thought Process:  Coherent and Goal Directed  Orientation:  Full (Time, Place, and Person)  Thought Content: Logical Perceptions: denies AH/VH  Suicidal Thoughts:  No  Homicidal Thoughts:  No  Memory:  Immediate;   Good Recent;   Good Remote;   Good  Judgement:  Fair  Insight:  Fair  Psychomotor Activity:  Increased- slightly improved, although constantly moving her legs  Concentration:  Concentration: Good and Attention Span: Good  Recall:  Good  Fund of Knowledge: Good  Language: Good  Akathisia:  No  Handed:  Right  AIMS (if indicated): no resting tremors, no cogwheel rigidity  Assets:  Communication Skills Desire for Improvement  ADL's:  Intact  Cognition: WNL  Sleep:  Poor   Screenings:   Assessment and Plan:  [name] is a 61 y.o. year old female with a history of depression, COPD, hypertension, who presents for follow up appointment for MDD (major depressive disorder), recurrent episode, moderate (HCC)   # MDD, moderate, recurrent without psychotic features Although patient continues to endorse anxiety, there appears to be overall improvement in neurovegetative symptoms.  Psychosocial stressors including loss of her husband, her son received a sentence, discordance with her boss at work.  We will  continue Lexapro to target depression and anxiety.  Will decrease Abilify given her anxiety might be secondary to side effect from Abilify.  She is instructed to try taking lower dose of lorazepam prn for anxiety. Validate her grief. She will continue to see a therapist.    Plan 1. Continue lexapro 20 mg daily  2. Decrease lorazepam 0.5-1 mg twice a day as needed for anxiety  3. Decrease Abilify 5 mg daily  4. Return to clinic in one month for 30 mins 5. Obtain TSH by her PCP in December  The patient demonstrates the following risk factors for suicide: Chronic risk factors for suicide include: psychiatric disorder of depression, anxiety . Acute risk factors for suicide include: loss (financial, interpersonal, professional). Protective factors for this patient include: positive social support, responsibility to others (children, family), coping skills and hope for the future. Considering these factors, the  overall suicide risk at this point appears to be low. Patient is appropriate for outpatient follow up.  The duration of this appointment visit was 30 minutes of face-to-face time with the patient.  Greater than 50% of this time was spent in counseling, explanation of  diagnosis, planning of further management, and coordination of care.  Neysa Hotter, MD 12/14/2016, 4:29 PM

## 2016-12-14 ENCOUNTER — Ambulatory Visit (INDEPENDENT_AMBULATORY_CARE_PROVIDER_SITE_OTHER): Payer: 59 | Admitting: Psychiatry

## 2016-12-14 ENCOUNTER — Encounter (HOSPITAL_COMMUNITY): Payer: Self-pay | Admitting: Psychiatry

## 2016-12-14 VITALS — BP 106/80 | HR 72 | Ht 61.0 in | Wt 158.0 lb

## 2016-12-14 DIAGNOSIS — F331 Major depressive disorder, recurrent, moderate: Secondary | ICD-10-CM

## 2016-12-14 MED ORDER — ARIPIPRAZOLE 5 MG PO TABS: 5.0000 mg | ORAL_TABLET | Freq: Every day | ORAL | 0 refills | Status: DC

## 2016-12-14 MED ORDER — LORAZEPAM 1 MG PO TABS: 1.0000 mg | ORAL_TABLET | Freq: Two times a day (BID) | ORAL | 0 refills | Status: DC

## 2016-12-14 NOTE — Patient Instructions (Signed)
1. Continue lexapro 20 mg daily  2. Decrease lorazepam 0.5-1 mg twice a day as needed for anxiety  3. Decrease Abilify 5 mg daily  4. Return to clinic in one month for 30 mins

## 2016-12-27 ENCOUNTER — Ambulatory Visit (INDEPENDENT_AMBULATORY_CARE_PROVIDER_SITE_OTHER): Payer: 59 | Admitting: Licensed Clinical Social Worker

## 2016-12-27 ENCOUNTER — Encounter (HOSPITAL_COMMUNITY): Payer: Self-pay | Admitting: Licensed Clinical Social Worker

## 2016-12-27 ENCOUNTER — Ambulatory Visit (HOSPITAL_COMMUNITY): Payer: Self-pay | Admitting: Licensed Clinical Social Worker

## 2016-12-27 DIAGNOSIS — F331 Major depressive disorder, recurrent, moderate: Secondary | ICD-10-CM | POA: Diagnosis not present
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
## 2017-01-10 ENCOUNTER — Other Ambulatory Visit (HOSPITAL_COMMUNITY): Payer: Self-pay | Admitting: Psychiatry

## 2017-01-10 MED ORDER — ESCITALOPRAM OXALATE 20 MG PO TABS: 20.0000 mg | ORAL_TABLET | Freq: Every day | ORAL | 0 refills | Status: DC

## 2017-01-16 NOTE — Progress Notes (Signed)
BH MD/PA/NP OP Progress Note  01/17/2017 4:04 PM [name]  MRN:  [id number]  Chief Complaint:  Chief Complaint    Follow-up; Depression; Anxiety     HPI:  Patient presents for follow-up appointment for depression.  She states that she has been feeling better.  She has done things more such as Christmas shopping.  She needed to work on Thanksgiving day.  She hopes that she has good Christmas, although she misses her husband.  She will visit her daughter-in-law.  Although she has not met her cousin as she used to, she might meet with her after Christmas.  She has been busy at work as their management changed.  She has fair concentration.  She has initial and middle insomnia due to occasional anxiety.  She feels more energy.  She has fair appetite.  She denies SI.  She has panic attacks at times.  She has racing thoughts and feels tense at times.  She feels irritable at times.  She wants to try to be off Abilify; he has been taking 2.5 mg instead of 5 mg, without noticing the new order/tablet is 5 mg. she did not see significant change after decreasing the dose.   Per PMP,  Lorazepam filled on 11/20/2016, 1 mg 90 tabs for 30 days I have utilized the [place] Controlled Substances Reporting System (PMP AWARxE) to confirm adherence regarding the patient's medication. My review reveals appropriate prescription fills.   Visit Diagnosis:    ICD-10-CM   1. MDD (major depressive disorder), recurrent episode, moderate (HCC) F33.1     Past Psychiatric History:  I have reviewed the patient's psychiatry history in detail and updated the patient record. Outpatient:denies (was treated for depression by PCP) Psychiatry admission:denies Previous suicide attempt:denies Past trials of medication:sertraline, fluoxetine (became emotional), Abilify, trazodone History of violence:denies  Past Medical History:  Past Medical History:  Diagnosis Date  . Hypertension     Past Surgical History:   Procedure Laterality Date  . KNEE SURGERY      Family Psychiatric History: I have reviewed the patient's family history in detail and updated the patient record.  Family History:  Family History  Problem Relation Age of Onset  . Drug abuse Son     Social History:  Social History   Socioeconomic History  . Marital status: Married    Spouse name: None  . Number of children: None  . Years of education: None  . Highest education level: None  Social Needs  . Financial resource strain: None  . Food insecurity - worry: None  . Food insecurity - inability: None  . Transportation needs - medical: None  . Transportation needs - non-medical: None  Occupational History  . None  Tobacco Use  . Smoking status: Current Every Day Smoker    Packs/day: 0.75  . Smokeless tobacco: Never Used  Substance and Sexual Activity  . Alcohol use: Yes    Comment: occasional. 11-15-2016 occa  . Drug use: No    Comment: 11-15-2016 per pt no  . Sexual activity: None  Other Topics Concern  . None  Social History Narrative  . None    Allergies: No Known Allergies  Metabolic Disorder Labs: No results found for: HGBA1C, MPG No results found for: PROLACTIN No results found for: CHOL, TRIG, HDL, CHOLHDL, VLDL, LDLCALC   Therapeutic Level Labs: No results found for: LITHIUM No results found for: VALPROATE No components found for:  CBMZ  Current Medications: Current Outpatient Medications  Medication Sig   Dispense Refill  . amLODipine (NORVASC) 5 MG tablet Take 5 mg by mouth daily.    . celecoxib (CELEBREX) 100 MG capsule Take 100 mg by mouth 2 (two) times daily.    Marland Kitchen escitalopram (LEXAPRO) 20 MG tablet Take 1 tablet (20 mg total) by mouth daily. 30 tablet 0  . LORazepam (ATIVAN) 1 MG tablet Take 1 tablet (1 mg total) 2 (two) times daily by mouth. 60 tablet 0  . metoprolol tartrate (LOPRESSOR) 25 MG tablet Take 25 mg by mouth 2 (two) times daily.    . naproxen sodium (ALEVE) 220 MG tablet  Take 220 mg by mouth 2 (two) times daily as needed. Arthritis    . eszopiclone (LUNESTA) 1 MG TABS tablet Take immediately before bedtime. 0.5- 1 mg at night as needed for sleep 30 tablet 0   No current facility-administered medications for this visit.      Musculoskeletal: Strength & Muscle Tone: within normal limits Gait & Station: normal Patient leans: N/A  Psychiatric Specialty Exam: Review of Systems  Psychiatric/Behavioral: Positive for depression. Negative for hallucinations, memory loss, substance abuse and suicidal ideas. The patient is nervous/anxious and has insomnia.   All other systems reviewed and are negative.   Blood pressure 104/74, pulse 78, height 5' 2" (1.575 m), weight 155 lb (70.3 kg), SpO2 94 %.Body mass index is 28.35 kg/m.  General Appearance: Fairly Groomed  Eye Contact:  Good  Speech:  Clear and Coherent  Volume:  Normal  Mood:  Anxious  Affect:  Appropriate, Congruent and less restricted, smiles at times  Thought Process:  Coherent and Goal Directed  Orientation:  Full (Time, Place, and Person)  Thought Content: Logical   Suicidal Thoughts:  No  Homicidal Thoughts:  No  Memory:  Immediate;   Good Recent;   Good Remote;   Good  Judgement:  Good  Insight:  Fair  Psychomotor Activity:  Normal  Concentration:  Concentration: Good and Attention Span: Good  Recall:  Good  Fund of Knowledge: Good  Language: Good  Akathisia:  No  Handed:  Right  AIMS (if indicated): not done  Assets:  Communication Skills Desire for Improvement  ADL's:  Intact  Cognition: WNL  Sleep:  Poor   Screenings:   Assessment and Plan:  [name] is a 61 y.o. year old female with a history of depression, COPD, hypertension , who presents for follow up appointment for MDD (major depressive disorder), recurrent episode, moderate ([place])  # MDD, moderate, recurrent without psychotic features There has been overall improvement in neurovegetative symptoms and  anxiety, which coincided with cross tapering from sertraline to Lexapro and starting therapy.  Will continue current dose of Lexapro to target depression and anxiety.  We will discontinue Abilify (she has been taking 2.5 mg instead of 5 mg by error) given limited effect from this medication. Will discontinue ativan given patient reports drowsiness from this medication. Validate her grief of loss of her husband. Discussed cognitive defusion and self compassion. She will continue to see a therapist.   # Insomnia Patient reports middle and initial insomnia. Will try lunesta. Discussed risk of oversedation. She agrees not to take lunesta with other benzodiazepine.    Plan I have reviewed and updated plans as below 1. Continue lexapro 20 mg daily  2. Discontinue Abilify  3. Discontinue lorazepam 4. Start Lunesta 0.5 -1 mg at night as needed for sleep  5. Return to clinic in one month for 15 mins 6. Obtain TSH by  her PCP in December - She continues to see Mr. Sheets for therapy  The patient demonstrates the following risk factors for suicide: Chronic risk factors for suicide include:psychiatric disorder ofdepression, anxiety. Acute risk factorsfor suicide include: loss (financial, interpersonal, professional). Protective factorsfor this patient include: positive social support, responsibility to others (children, family), coping skills and hope for the future. Considering these factors, the overall suicide risk at this point appears to below. Patientisappropriate for outpatient follow up.  The duration of this appointment visit was 30 minutes of face-to-face time with the patient.  Greater than 50% of this time was spent in counseling, explanation of  diagnosis, planning of further management, and coordination of care.  Norman Clay, MD 01/17/2017, 4:04 PM

## 2017-01-17 ENCOUNTER — Ambulatory Visit (HOSPITAL_COMMUNITY): Payer: 59 | Admitting: Psychiatry

## 2017-01-17 ENCOUNTER — Encounter (HOSPITAL_COMMUNITY): Payer: Self-pay | Admitting: Psychiatry

## 2017-01-17 VITALS — BP 104/74 | HR 78 | Ht 62.0 in | Wt 155.0 lb

## 2017-01-17 DIAGNOSIS — J449 Chronic obstructive pulmonary disease, unspecified: Secondary | ICD-10-CM | POA: Diagnosis not present

## 2017-01-17 DIAGNOSIS — F331 Major depressive disorder, recurrent, moderate: Secondary | ICD-10-CM | POA: Diagnosis not present

## 2017-01-17 DIAGNOSIS — F419 Anxiety disorder, unspecified: Secondary | ICD-10-CM | POA: Diagnosis not present

## 2017-01-17 DIAGNOSIS — G4700 Insomnia, unspecified: Secondary | ICD-10-CM

## 2017-01-17 DIAGNOSIS — R450 Nervousness: Secondary | ICD-10-CM | POA: Diagnosis not present

## 2017-01-17 DIAGNOSIS — I10 Essential (primary) hypertension: Secondary | ICD-10-CM | POA: Diagnosis not present

## 2017-01-17 DIAGNOSIS — F17210 Nicotine dependence, cigarettes, uncomplicated: Secondary | ICD-10-CM | POA: Diagnosis not present

## 2017-01-17 DIAGNOSIS — Z813 Family history of other psychoactive substance abuse and dependence: Secondary | ICD-10-CM | POA: Diagnosis not present

## 2017-01-17 MED ORDER — ESCITALOPRAM OXALATE 20 MG PO TABS: 20.0000 mg | ORAL_TABLET | Freq: Every day | ORAL | 0 refills | Status: DC

## 2017-01-17 MED ORDER — ESZOPICLONE 1 MG PO TABS: ORAL_TABLET | ORAL | 0 refills | Status: DC

## 2017-01-17 NOTE — Patient Instructions (Signed)
1. Continue lexapro 20 mg daily  2. Discontinue Abilify  3. Discontinue lorazepam 4. Start Lunesta 0.5 -1 mg at night as needed for sleep  5. Return to clinic in one month for 15 mins

## 2017-01-22 ENCOUNTER — Ambulatory Visit (HOSPITAL_COMMUNITY): Payer: 59 | Admitting: Licensed Clinical Social Worker

## 2017-02-07 ENCOUNTER — Encounter (HOSPITAL_COMMUNITY): Payer: Self-pay | Admitting: Licensed Clinical Social Worker

## 2017-02-07 ENCOUNTER — Ambulatory Visit (HOSPITAL_COMMUNITY): Payer: 59 | Admitting: Licensed Clinical Social Worker

## 2017-02-07 DIAGNOSIS — F331 Major depressive disorder, recurrent, moderate: Secondary | ICD-10-CM

## 2017-02-07 NOTE — Progress Notes (Signed)
   THERAPIST PROGRESS NOTE  Session Time: 1:00 pm- 1:40 pm  Participation Level: Active  Behavioral Response: NeatAlertEuthymic  Type of Therapy: Individual Therapy  Treatment Goals addressed: Coping  Interventions: CBT and Solution Focused  Summary: [name] is a 62 y.o. female who presents oriented x5 (person, place, situation, time and object), alert, anxious, casually dressed, appropriately groomed, average height, average weight, and cooperative to address mood. Patient has a history of medical treatment including degenerative joint disease and high blood pressure. Patient has a history of mental health treatment including medication management. Patient denies symptoms of mania. She denies suicidal and homicidal ideations. Patient denies psychosis including auditory and visual hallucinations. Patient denies substance abuse. Patient is at low risk for lethality.  Patient reported that she had a difficult time during the holidays due to her son not being home and her husband being deceased. Patient reported that even though it was difficult she was able to spend time with family and she didn't call into work even if she felt down. Patient stated that she missed her husband and visited the [place] which caused her to feel down. Patient shared guilt that she has about taking her husband to hospice before he passed. She feels like he was mad that she took him there. Patient also shared that before taking him to hospice he was not in his right mind. After discussion, patient understood that she did the right thing by taking her husband to hospice and his anger was more than likely due to being not in his right mind.  Patient engaged in session. Patient responded well to interventions. Patient continues to meet criteria for Major Depressive disorder, recurrent episode, moderate. She will continue in outpatient therapy due to being the least restrictive service to meet her needs at this  time. Patient made minimal progress on her goals.   Suicidal/Homicidal: Negativewithout intent/plan  Therapist Response: Therapist reviewed patient's recent thoughts and behaviors. Therapist utilized CBT to address mood. Therapist processed patient's feelings to identify triggers for mood. Therapist discussed patient's grief related to her husband. Therapist challenged patient's cognitions and guilt related to her grief.   Plan: Return again in 2-3 weeks. Therapist will review goals on or before Jan. 31,2019.  Diagnosis: Axis I: Major depressive disorder, recurrent episode, moderate    Axis II: No diagnosis    Faith BellowsJoshua Chriselda Leppert, LCSW 02/07/2017

## 2017-02-12 NOTE — Progress Notes (Signed)
BH MD/PA/NP OP Progress Note  02/15/2017 4:47 PM [name]  MRN:  [id number]  Chief Complaint:  Chief Complaint    Depression; Anxiety; Follow-up     HPI:  Patient presents for follow-up appointment for depression.  She states that she has been feeling better.  Although she felt depressed around Christmas without her husband, she felt better afterwards being with other family member.  She has been getting out more and has been enjoying things.  Although she is busy at work, she has been handling things better without getting overwhelmed.  She endorses insomnia.  She would like to get back to Ativan as Lunesta did not work for insomnia.  She has fair appetite and concentration.  She has less anhedonia.  She denies SI.  She feels anxious and tense at times.  She denies panic attacks.    Wt Readings from Last 3 Encounters:  02/15/17 153 lb (69.4 kg)  01/17/17 155 lb (70.3 kg)  12/14/16 158 lb (71.7 kg)     Lunesta filled on 01/21/2017  I have utilized the [place] Controlled Substances Reporting System (PMP AWARxE) to confirm adherence regarding the patient's medication. My review reveals appropriate prescription fills.   Visit Diagnosis:    ICD-10-CM   1. MDD (major depressive disorder), recurrent episode, moderate (HCC) F33.1     Past Psychiatric History:  I have reviewed the patient's psychiatry history in detail and updated the patient record. Outpatient:denies (was treated for depression by PCP) Psychiatry admission:denies Previous suicide attempt:denies Past trials of medication:sertraline, fluoxetine (became emotional), Abilify, trazodone, Lunesta History of violence:denies    Past Medical History:  Past Medical History:  Diagnosis Date  . Hypertension     Past Surgical History:  Procedure Laterality Date  . KNEE SURGERY      Family Psychiatric History: I have reviewed the patient's family history in detail and updated the patient record.  Family  History:  Family History  Problem Relation Age of Onset  . Drug abuse Son     Social History:  Social History   Socioeconomic History  . Marital status: Married    Spouse name: None  . Number of children: None  . Years of education: None  . Highest education level: None  Social Needs  . Financial resource strain: None  . Food insecurity - worry: None  . Food insecurity - inability: None  . Transportation needs - medical: None  . Transportation needs - non-medical: None  Occupational History  . None  Tobacco Use  . Smoking status: Current Every Day Smoker    Packs/day: 0.75  . Smokeless tobacco: Never Used  Substance and Sexual Activity  . Alcohol use: Yes    Comment: occasional. 11-15-2016 occa  . Drug use: No    Comment: 11-15-2016 per pt no  . Sexual activity: None  Other Topics Concern  . None  Social History Narrative  . None    Allergies: No Known Allergies  Metabolic Disorder Labs: No results found for: HGBA1C, MPG No results found for: PROLACTIN No results found for: CHOL, TRIG, HDL, CHOLHDL, VLDL, LDLCALC   Therapeutic Level Labs: No results found for: LITHIUM No results found for: VALPROATE No components found for:  CBMZ  Current Medications: Current Outpatient Medications  Medication Sig Dispense Refill  . amLODipine (NORVASC) 5 MG tablet Take 5 mg by mouth daily.    . celecoxib (CELEBREX) 100 MG capsule Take 100 mg by mouth 2 (two) times daily.    Marland Kitchen escitalopram (  LEXAPRO) 20 MG tablet Take 1 tablet (20 mg total) by mouth daily. 30 tablet 1  . eszopiclone (LUNESTA) 1 MG TABS tablet Take immediately before bedtime. 0.5- 1 mg at night as needed for sleep 30 tablet 0  . LORazepam (ATIVAN) 1 MG tablet Take 1 tablet (1 mg total) 2 (two) times daily by mouth. 60 tablet 0  . metoprolol tartrate (LOPRESSOR) 25 MG tablet Take 25 mg by mouth 2 (two) times daily.    . naproxen sodium (ALEVE) 220 MG tablet Take 220 mg by mouth 2 (two) times daily as needed.  Arthritis    . LORazepam (ATIVAN) 1 MG tablet 0.5-1 mg twice a day as needed for anxiety 30 tablet 0   No current facility-administered medications for this visit.      Musculoskeletal: Strength & Muscle Tone: within normal limits Gait & Station: normal Patient leans: N/A  Psychiatric Specialty Exam: ROS  Blood pressure 101/69, pulse (!) 56, height 5\' 2"  (1.575 m), weight 153 lb (69.4 kg), SpO2 97 %.Body mass index is 27.98 kg/m.  General Appearance: Fairly Groomed  Eye Contact:  Good  Speech:  Clear and Coherent  Volume:  Normal  Mood:  "better"  Affect:  anxious but more reactive, smiles  Thought Process:  Coherent and Goal Directed  Orientation:  Full (Time, Place, and Person)  Thought Content: Logical   Suicidal Thoughts:  No  Homicidal Thoughts:  No  Memory:  Immediate;   Good Recent;   Good Remote;   Good  Judgement:  Good  Insight:  Fair  Psychomotor Activity:  Normal,constantly shaking her legs  Concentration:  Concentration: Good and Attention Span: Good  Recall:  Good  Fund of Knowledge: Good  Language: Good  Akathisia:  No  Handed:  Right  AIMS (if indicated): not done  Assets:  Communication Skills Desire for Improvement  ADL's:  Intact  Cognition: WNL  Sleep:  Poor   Screenings:   Assessment and Plan:  [name] is a 62 y.o. year old female with a history of depression, COPD, hypertension, who presents for follow up appointment for MDD (major depressive disorder), recurrent episode, moderate (HCC)  # MDD, moderate, recurrent without psychotic features There has been overall improvement in neurovegetative symptoms and anxiety since cross tapering from sertraline to Lexapro.  Will continue current dose of Lexapro to target depression and anxiety.  She reports preference to be back on Ativan instead of Lunesta; will restart Ativan to target anxiety.  Discussed risk of oversedation and dependence.  Validated her grief of loss of her husband.   Discussed cognitive diffusion.  She is encouraged to see a therapist.   # Insomnia Patient reports limited effect from [facility].  Will discontinue this medication.  Discussed sleep hygiene.    Plan 1. Continue lexapro 20 mg daily  2. Discontinue Lunesta 3. Restart lorazepam 0.5-1 mg twice a day as needed for anxiety (has one refill from Nov. Ordered another refill) 4. Return to clinic in two months for 15 mins - checked TSH by PCP in 01/2017; wnl per self report - She continues to see Mr. Sheets for therapy  The patient demonstrates the following risk factors for suicide: Chronic risk factors for suicide include:psychiatric disorder ofdepression, anxiety. Acute risk factorsfor suicide include: loss (financial, interpersonal, professional). Protective factorsfor this patient include: positive social support, responsibility to others (children, family), coping skills and hope for the future. Considering these factors, the overall suicide risk at this point appears to below. Patientisappropriate  for outpatient follow up.   Neysa Hottereina Shalimar Mcclain, MD 02/15/2017, 4:47 PM

## 2017-02-15 ENCOUNTER — Ambulatory Visit (HOSPITAL_COMMUNITY): Payer: 59 | Admitting: Psychiatry

## 2017-02-15 ENCOUNTER — Encounter (HOSPITAL_COMMUNITY): Payer: Self-pay | Admitting: Psychiatry

## 2017-02-15 VITALS — BP 101/69 | HR 56 | Ht 62.0 in | Wt 153.0 lb

## 2017-02-15 DIAGNOSIS — F17210 Nicotine dependence, cigarettes, uncomplicated: Secondary | ICD-10-CM | POA: Diagnosis not present

## 2017-02-15 DIAGNOSIS — Z813 Family history of other psychoactive substance abuse and dependence: Secondary | ICD-10-CM

## 2017-02-15 DIAGNOSIS — G4700 Insomnia, unspecified: Secondary | ICD-10-CM

## 2017-02-15 DIAGNOSIS — F331 Major depressive disorder, recurrent, moderate: Secondary | ICD-10-CM | POA: Diagnosis not present

## 2017-02-15 MED ORDER — LORAZEPAM 1 MG PO TABS: ORAL_TABLET | ORAL | 0 refills | Status: DC

## 2017-02-15 MED ORDER — ESCITALOPRAM OXALATE 20 MG PO TABS: 20.0000 mg | ORAL_TABLET | Freq: Every day | ORAL | 1 refills | Status: DC

## 2017-02-15 NOTE — Patient Instructions (Addendum)
1. Continue lexapro 20 mg daily  2. Discontinue Lunesta 3. Restart lorazepam 0.5-1 mg twice a day as needed for anxiety  4. Return to clinic in two months for 15 mins

## 2017-02-16 ENCOUNTER — Other Ambulatory Visit (HOSPITAL_COMMUNITY): Payer: Self-pay | Admitting: Psychiatry

## 2017-02-16 MED ORDER — ESCITALOPRAM OXALATE 20 MG PO TABS: 20.0000 mg | ORAL_TABLET | Freq: Every day | ORAL | 0 refills | Status: DC

## 2017-02-28 ENCOUNTER — Ambulatory Visit (HOSPITAL_COMMUNITY): Payer: Self-pay | Admitting: Licensed Clinical Social Worker

## 2017-04-16 NOTE — Progress Notes (Signed)
BH MD/PA/NP OP Progress Note  04/17/2017 4:42 PM [name]  MRN:  [id number]  Chief Complaint:  Chief Complaint    Depression; Follow-up     HPI:  Patient presents for follow-up appointment for depression.  She states that she has been doing better since the last appointment.  Although she still misses her husband, she feels that she is getting better.  She will make herself to see other people; including extended family and her friends.  She usually feels happy after seeing them.  She states that she has been busy at work.  She tries to balance work with life and tries not to bring work to home. She has  insomnia with nighttime awakening.  She feels more motivated and has more energy.  She has good concentration.  She denies SI.  She feels less anxious and tense.  She denies irritability.  She denies panic attacks.  She takes Ativan once a day or less for anxiety.   Per PMP,  Ativan last filled on 02/15/2017  I have utilized the [place] Controlled Substances Reporting System (PMP AW[place]) to confirm adherence regarding the patient's medication. My review reveals appropriate prescription fills.   Visit Diagnosis:    ICD-10-CM   1. MDD (major depressive disorder), recurrent episode, moderate (HCC) F33.1     Past Psychiatric History:  I have reviewed the patient's psychiatry history in detail and updated the patient record. Outpatient:denies (was treated for depression by PCP) Psychiatry admission:denies Previous suicide attempt:denies Past trials of medication:sertraline, fluoxetine (became emotional), Abilify, trazodone, Lunesta History of violence:denies   Past Medical History:  Past Medical History:  Diagnosis Date  . Hypertension     Past Surgical History:  Procedure Laterality Date  . KNEE SURGERY      Family Psychiatric History: I have reviewed the patient's family history in detail and updated the patient record.  Family History:  Family History  Problem  Relation Age of Onset  . Drug abuse Son     Social History:  Social History   Socioeconomic History  . Marital status: Married    Spouse name: None  . Number of children: None  . Years of education: None  . Highest education level: None  Social Needs  . Financial resource strain: None  . Food insecurity - worry: None  . Food insecurity - inability: None  . Transportation needs - medical: None  . Transportation needs - non-medical: None  Occupational History  . None  Tobacco Use  . Smoking status: Current Every Day Smoker    Packs/day: 0.75  . Smokeless tobacco: Never Used  Substance and Sexual Activity  . Alcohol use: Yes    Comment: occasional. 11-15-2016 occa  . Drug use: No    Comment: 11-15-2016 per pt no  . Sexual activity: None  Other Topics Concern  . None  Social History Narrative  . None    Allergies: No Known Allergies  Metabolic Disorder Labs: No results found for: HGBA1C, MPG No results found for: PROLACTIN No results found for: CHOL, TRIG, HDL, CHOLHDL, VLDL, LDLCALC  Therapeutic Level Labs: No results found for: LITHIUM No results found for: VALPROATE No components found for:  CBMZ  Current Medications: Current Outpatient Medications  Medication Sig Dispense Refill  . amLODipine (NORVASC) 5 MG tablet Take 5 mg by mouth daily.    . celecoxib (CELEBREX) 100 MG capsule Take 100 mg by mouth 2 (two) times daily.    Marland Kitchen escitalopram (LEXAPRO) 20 MG tablet Take  1 tablet (20 mg total) by mouth daily. 90 tablet 0  . eszopiclone (LUNESTA) 1 MG TABS tablet Take immediately before bedtime. 0.5- 1 mg at night as needed for sleep 30 tablet 0  . LORazepam (ATIVAN) 1 MG tablet Take 1 tablet (1 mg total) by mouth daily as needed for anxiety. 30 tablet 2  . metoprolol tartrate (LOPRESSOR) 25 MG tablet Take 25 mg by mouth 2 (two) times daily.    . naproxen sodium (ALEVE) 220 MG tablet Take 220 mg by mouth 2 (two) times daily as needed. Arthritis     No current  facility-administered medications for this visit.      Musculoskeletal: Strength & Muscle Tone: within normal limits Gait & Station: normal Patient leans: N/A  Psychiatric Specialty Exam: Review of Systems  Psychiatric/Behavioral: Positive for depression. Negative for hallucinations, memory loss, substance abuse and suicidal ideas. The patient is nervous/anxious and has insomnia.   All other systems reviewed and are negative.   Blood pressure (!) 101/59, pulse 68, height 5\' 2"  (1.575 m), weight 154 lb (69.9 kg), SpO2 98 %.Body mass index is 28.17 kg/m.  General Appearance: Fairly Groomed  Eye Contact:  Good  Speech:  Clear and Coherent  Volume:  Normal  Mood:  "good"  Affect:  Appropriate and Congruent, euthymic  Thought Process:  Coherent and Goal Directed  Orientation:  Full (Time, Place, and Person)  Thought Content: Logical   Suicidal Thoughts:  No  Homicidal Thoughts:  No  Memory:  Immediate;   Good Recent;   Good Remote;   Good  Judgement:  Good  Insight:  Fair  Psychomotor Activity:  Normal  Concentration:  Concentration: Good and Attention Span: Good  Recall:  Good  Fund of Knowledge: Good  Language: Good  Akathisia:  No  Handed:  Right  AIMS (if indicated): not done  Assets:  Communication Skills Desire for Improvement  ADL's:  Intact  Cognition: WNL  Sleep:  Poor   Screenings:   Assessment and Plan:  [name] is a 63 y.o. year old female with a history of depression, COPD, hypertension , who presents for follow up appointment for MDD (major depressive disorder), recurrent episode, moderate (HCC)  # MDD, moderate, recurrent without psychotic features Patient reports overall improvement in neurovegetative symptoms and anxiety since cross tapering from sertraline to lexapro. Will continue current dose of Lexapro to target depression and anxiety.  Will continue Ativan as needed for anxiety.  Discussed risk of dependence and oversedation.   Validated grief of loss of her husband. Discussed behavioral activation,.    # Insomnia Discussed sleep hygiene. Will try melatonin for insomnia. Will consider switching to temazepam at the next encounter if she continues to require ativan at night.   Plan 1. Continue lexapro 20 mg daily  2. Continue lorazepam 0.5 mg twice a day as needed for anxiety  3. Try melatonin 3 mg at night, 2 hours before going to the bed 3. Return to clinic in three months for 15 mins - checked TSH by PCP in 01/2017; wnl per self report - She will return to see Mr. Sheets as needed for therapy  The patient demonstrates the following risk factors for suicide: Chronic risk factors for suicide include:psychiatric disorder ofdepression, anxiety. Acute risk factorsfor suicide include: loss (financial, interpersonal, professional). Protective factorsfor this patient include: positive social support, responsibility to others (children, family), coping skills and hope for the future. Considering these factors, the overall suicide risk at this point appears  to below. Patientisappropriate for outpatient follow up.  Neysa Hottereina Gertie Broerman, MD 04/17/2017, 4:42 PM

## 2017-04-17 ENCOUNTER — Encounter (HOSPITAL_COMMUNITY): Payer: Self-pay | Admitting: Psychiatry

## 2017-04-17 ENCOUNTER — Ambulatory Visit (HOSPITAL_COMMUNITY): Payer: 59 | Admitting: Psychiatry

## 2017-04-17 VITALS — BP 101/59 | HR 68 | Ht 62.0 in | Wt 154.0 lb

## 2017-04-17 DIAGNOSIS — G4700 Insomnia, unspecified: Secondary | ICD-10-CM

## 2017-04-17 DIAGNOSIS — Z813 Family history of other psychoactive substance abuse and dependence: Secondary | ICD-10-CM | POA: Diagnosis not present

## 2017-04-17 DIAGNOSIS — F331 Major depressive disorder, recurrent, moderate: Secondary | ICD-10-CM

## 2017-04-17 DIAGNOSIS — F17210 Nicotine dependence, cigarettes, uncomplicated: Secondary | ICD-10-CM

## 2017-04-17 DIAGNOSIS — F419 Anxiety disorder, unspecified: Secondary | ICD-10-CM

## 2017-04-17 DIAGNOSIS — R450 Nervousness: Secondary | ICD-10-CM | POA: Diagnosis not present

## 2017-04-17 MED ORDER — ESCITALOPRAM OXALATE 20 MG PO TABS: 20.0000 mg | ORAL_TABLET | Freq: Every day | ORAL | 0 refills | Status: DC

## 2017-04-17 MED ORDER — LORAZEPAM 1 MG PO TABS: 1.0000 mg | ORAL_TABLET | Freq: Every day | ORAL | 2 refills | Status: DC | PRN

## 2017-04-17 NOTE — Patient Instructions (Signed)
1. Continue lexapro 20 mg daily  2. Continue lorazepam 0.5 mg twice a day as needed for anxiety  3. Try melatonin 3 mg at night, 2 hours before going to the bed 3. Return to clinic in three months for 15 mins

## 2017-07-14 NOTE — Progress Notes (Signed)
BH MD/PA/NP OP Progress Note  07/18/2017 4:58 PM [name]  MRN:  [id number]  Chief Complaint:  Chief Complaint    Follow-up; Anxiety; Depression     HPI:  The patient presents for follow-up appointment for depression.  She states that she has been feeling more anxious and she would like to have a higher dose of Ativan.  She is a Production designer, theatre/television/film at [employer] and it has become more busy lately.  She states that she went to put flowers on grave of her husband this morning.  She states that it was not at suffering as she imagined.  The anniversary is coming next month and she feels as slightly more anxious as she does not know how she will react.  She visited her son in jail last Saturday.  He is doing well.  She tends to feel anxious thinking about him.  Although she has been taking care of his son, 70 year old, he may move out to his mother.  The mother left the house few months ago and is currently with other man.  She feels satisfied about this decision, although she would miss him. She complains of worsening knee pain and she may needs to have surgery in the future. She has middle and initial insomnia. She feels fatigue at times. She has good appetite. She denies SI. She feels anxious, tense. She has racing thoughts. She denies panic attacks.    Wt Readings from Last 3 Encounters:  07/18/17 153 lb (69.4 kg)  04/17/17 154 lb (69.9 kg)  02/15/17 153 lb (69.4 kg)   Per PMP,  Lorazepam filled on 05/24/2017    Visit Diagnosis:    ICD-10-CM   1. MDD (major depressive disorder), recurrent episode, moderate (HCC) F33.1     Past Psychiatric History: Please see initial evaluation for full details. I have reviewed the history. No updates at this time.     Past Medical History:  Past Medical History:  Diagnosis Date  . Hypertension     Past Surgical History:  Procedure Laterality Date  . KNEE SURGERY      Family Psychiatric History: Please see initial evaluation for full details. I  have reviewed the history. No updates at this time.     Family History:  Family History  Problem Relation Age of Onset  . Drug abuse Son     Social History:  Social History   Socioeconomic History  . Marital status: Married    Spouse name: Not on file  . Number of children: Not on file  . Years of education: Not on file  . Highest education level: Not on file  Occupational History  . Not on file  Social Needs  . Financial resource strain: Not on file  . Food insecurity:    Worry: Not on file    Inability: Not on file  . Transportation needs:    Medical: Not on file    Non-medical: Not on file  Tobacco Use  . Smoking status: Current Every Day Smoker    Packs/day: 0.75  . Smokeless tobacco: Never Used  Substance and Sexual Activity  . Alcohol use: Yes    Comment: occasional. 11-15-2016 occa  . Drug use: No    Comment: 11-15-2016 per pt no  . Sexual activity: Not on file  Lifestyle  . Physical activity:    Days per week: Not on file    Minutes per session: Not on file  . Stress: Not on file  Relationships  . Social  connections:    Talks on phone: Not on file    Gets together: Not on file    Attends religious service: Not on file    Active member of club or organization: Not on file    Attends meetings of clubs or organizations: Not on file    Relationship status: Not on file  Other Topics Concern  . Not on file  Social History Narrative  . Not on file    Allergies: No Known Allergies  Metabolic Disorder Labs: No results found for: HGBA1C, MPG No results found for: PROLACTIN No results found for: CHOL, TRIG, HDL, CHOLHDL, VLDL, LDLCALC  Therapeutic Level Labs: No results found for: LITHIUM No results found for: VALPROATE No components found for:  CBMZ  Current Medications: Current Outpatient Medications  Medication Sig Dispense Refill  . amLODipine (NORVASC) 5 MG tablet Take 5 mg by mouth daily.    . celecoxib (CELEBREX) 100 MG capsule Take 100 mg  by mouth 2 (two) times daily.    Marland Kitchen. escitalopram (LEXAPRO) 20 MG tablet Take 1 tablet (20 mg total) by mouth daily. 90 tablet 0  . eszopiclone (LUNESTA) 1 MG TABS tablet Take immediately before bedtime. 0.5- 1 mg at night as needed for sleep 30 tablet 0  . LORazepam (ATIVAN) 1 MG tablet Take 1 tablet (1 mg total) by mouth daily as needed for anxiety. 30 tablet 2  . metoprolol tartrate (LOPRESSOR) 25 MG tablet Take 25 mg by mouth 2 (two) times daily.    . naproxen sodium (ALEVE) 220 MG tablet Take 220 mg by mouth 2 (two) times daily as needed. Arthritis     No current facility-administered medications for this visit.      Musculoskeletal: Strength & Muscle Tone: within normal limits Gait & Station: normal Patient leans: N/A  Psychiatric Specialty Exam: Review of Systems  Psychiatric/Behavioral: Positive for depression. Negative for hallucinations, memory loss, substance abuse and suicidal ideas. The patient is nervous/anxious and has insomnia.   All other systems reviewed and are negative.   Blood pressure 136/83, pulse 81, height 5\' 2"  (1.575 m), weight 153 lb (69.4 kg), SpO2 95 %.Body mass index is 27.98 kg/m.  General Appearance: Fairly Groomed  Eye Contact:  Good  Speech:  Clear and Coherent  Volume:  Normal  Mood:  Anxious  Affect:  Appropriate, Congruent and slightly tense  Thought Process:  Coherent  Orientation:  Full (Time, Place, and Person)  Thought Content: Logical   Suicidal Thoughts:  No  Homicidal Thoughts:  No  Memory:  Immediate;   Good  Judgement:  Good  Insight:  Fair  Psychomotor Activity:  Normal  Concentration:  Concentration: Good and Attention Span: Good  Recall:  Good  Fund of Knowledge: Good  Language: Good  Akathisia:  No  Handed:  Right  AIMS (if indicated): not done  Assets:  Communication Skills Desire for Improvement  ADL's:  Intact  Cognition: WNL  Sleep:  Poor   Screenings:   Assessment and Plan:  Drenda Freezeatricia M Shawler is a 62 y.o.  year old female with a history of depression, COPD, hypertension, who presents for follow up appointment for MDD (major depressive disorder), recurrent episode, moderate (HCC)  # MDD, moderate, recurrent without psychotic features The patient reports slight worsening in neurovegetative symptoms and anxiety since the last visit Psychosocial stressors including upcoming anniversary of her deceased husband, work environment and her son who is in jail. She also reports concern of her grandchild moving to the mother's  place. Although she will greatly benefit from adjunctive treatment for depression and anxiety, she reports preference to stay on current medication regimen.  Will continue Lexapro to target depression.  Will continue the lorazepam as needed for anxiety.  Discussed risk of dependence and oversedation.  Validated grief of loss of her husband.  Discussed behavioral activation.    # Insomnia Discussed sleep hygiene.  Advised the patient again to try melatonin for insomnia.   Plan I have reviewed and updated plans as below 1. Continue lexapro 20 mg daily  2. Continue lorazepam 0.5 mg twice a day as needed for anxiety  3. Try melatonin 3 mg at night, 2 hours before going to the bed 3. Return to clinic in three months for 15 mins - checked TSH by PCP in 01/2017; wnl per self report  The patient demonstrates the following risk factors for suicide: Chronic risk factors for suicide include:psychiatric disorder ofdepression, anxiety. Acute risk factorsfor suicide include: loss (financial, interpersonal, professional). Protective factorsfor this patient include: positive social support, responsibility to others (children, family), coping skills and hope for the future. Considering these factors, the overall suicide risk at this point appears to below. Patientisappropriate for outpatient follow up.  The duration of this appointment visit was 30 minutes of face-to-face time with the patient.   Greater than 50% of this time was spent in counseling, explanation of  diagnosis, planning of further management, and coordination of care.   Neysa Hotter, MD 07/18/2017, 4:58 PM

## 2017-07-18 ENCOUNTER — Ambulatory Visit (HOSPITAL_COMMUNITY): Payer: 59 | Admitting: Psychiatry

## 2017-07-18 ENCOUNTER — Encounter (HOSPITAL_COMMUNITY): Payer: Self-pay | Admitting: Psychiatry

## 2017-07-18 VITALS — BP 136/83 | HR 81 | Ht 62.0 in | Wt 153.0 lb

## 2017-07-18 DIAGNOSIS — F17210 Nicotine dependence, cigarettes, uncomplicated: Secondary | ICD-10-CM | POA: Diagnosis not present

## 2017-07-18 DIAGNOSIS — Z813 Family history of other psychoactive substance abuse and dependence: Secondary | ICD-10-CM

## 2017-07-18 DIAGNOSIS — F331 Major depressive disorder, recurrent, moderate: Secondary | ICD-10-CM

## 2017-07-18 MED ORDER — ESCITALOPRAM OXALATE 20 MG PO TABS: 20.0000 mg | ORAL_TABLET | Freq: Every day | ORAL | 0 refills | Status: DC

## 2017-07-18 MED ORDER — LORAZEPAM 1 MG PO TABS: 1.0000 mg | ORAL_TABLET | Freq: Every day | ORAL | 2 refills | Status: DC | PRN

## 2017-07-18 NOTE — Patient Instructions (Signed)
1. Continue lexapro 20 mg daily  2. Continue lorazepam 0.5 mg twice a day as needed for anxiety  3. Try melatonin 3 mg at night, 2 hours before going to the bed 3. Return to clinic in three months for 15 mins

## 2017-09-18 DIAGNOSIS — F17210 Nicotine dependence, cigarettes, uncomplicated: Secondary | ICD-10-CM | POA: Insufficient documentation

## 2017-09-18 DIAGNOSIS — I10 Essential (primary) hypertension: Secondary | ICD-10-CM | POA: Insufficient documentation

## 2017-09-18 DIAGNOSIS — Z299 Encounter for prophylactic measures, unspecified: Secondary | ICD-10-CM | POA: Insufficient documentation

## 2017-10-10 DIAGNOSIS — M170 Bilateral primary osteoarthritis of knee: Secondary | ICD-10-CM | POA: Diagnosis not present

## 2017-10-10 DIAGNOSIS — M6281 Muscle weakness (generalized): Secondary | ICD-10-CM | POA: Diagnosis not present

## 2017-10-10 DIAGNOSIS — R262 Difficulty in walking, not elsewhere classified: Secondary | ICD-10-CM | POA: Diagnosis not present

## 2017-10-15 NOTE — Progress Notes (Addendum)
BH MD/PA/NP OP Progress Note  10/18/2017 4:50 PM [name]  MRN:  [id number]  Chief Complaint:  Chief Complaint    Depression; Follow-up     HPI:  Patient presents for follow-up appointment for depression.  She states that her son overdosed in jail.  She was able to meet with him yesterday.  He told the patient that he was not suicidal, but did "stupid thing." She feels relieved now that she is able to see him once a month.  She was feeling more depressed and did not want to get out of the bed when she was not sure how he was doing. Although she still misses her husband, she was able to laugh about him with her son. She reports that her work environment is improving. She states that she will have knee surgery next month.  She has initial insomnia.  She feels fatigue at times.  She has fair concentration.  She denies SI.  She feels anxious and tense at times.  She denies panic attacks.    Per PMP,  Ativan filled on 09/17/2017   Visit Diagnosis:    ICD-10-CM   1. MDD (major depressive disorder), recurrent episode, moderate (HCC) F33.1     Past Psychiatric History: Please see initial evaluation for full details. I have reviewed the history. No updates at this time.     Past Medical History:  Past Medical History:  Diagnosis Date  . Hypertension     Past Surgical History:  Procedure Laterality Date  . KNEE SURGERY      Family Psychiatric History: Please see initial evaluation for full details. I have reviewed the history. No updates at this time.     Family History:  Family History  Problem Relation Age of Onset  . Drug abuse Son     Social History:  Social History   Socioeconomic History  . Marital status: Married    Spouse name: Not on file  . Number of children: Not on file  . Years of education: Not on file  . Highest education level: Not on file  Occupational History  . Not on file  Social Needs  . Financial resource strain: Not on file  . Food  insecurity:    Worry: Not on file    Inability: Not on file  . Transportation needs:    Medical: Not on file    Non-medical: Not on file  Tobacco Use  . Smoking status: Former Smoker    Packs/day: 0.75    Start date: 10/15/2017  . Smokeless tobacco: Never Used  Substance and Sexual Activity  . Alcohol use: Yes    Comment: occasional. 11-15-2016 occa  . Drug use: No    Comment: 11-15-2016 per pt no  . Sexual activity: Not on file  Lifestyle  . Physical activity:    Days per week: Not on file    Minutes per session: Not on file  . Stress: Not on file  Relationships  . Social connections:    Talks on phone: Not on file    Gets together: Not on file    Attends religious service: Not on file    Active member of club or organization: Not on file    Attends meetings of clubs or organizations: Not on file    Relationship status: Not on file  Other Topics Concern  . Not on file  Social History Narrative  . Not on file    Allergies: No Known Allergies  Metabolic Disorder  Labs: No results found for: HGBA1C, MPG No results found for: PROLACTIN No results found for: CHOL, TRIG, HDL, CHOLHDL, VLDL, LDLCALC Therapeutic Level Labs: No results found for: LITHIUM No results found for: VALPROATE No components found for:  CBMZ  Current Medications: Current Outpatient Medications  Medication Sig Dispense Refill  . amLODipine (NORVASC) 5 MG tablet Take 5 mg by mouth daily.    . celecoxib (CELEBREX) 100 MG capsule Take 100 mg by mouth 2 (two) times daily.    Marland Kitchen escitalopram (LEXAPRO) 20 MG tablet Take 1 tablet (20 mg total) by mouth daily. 90 tablet 0  . LORazepam (ATIVAN) 1 MG tablet Take 1 tablet (1 mg total) by mouth daily as needed for anxiety. 30 tablet 2  . metoprolol tartrate (LOPRESSOR) 25 MG tablet Take 25 mg by mouth 2 (two) times daily.    . naproxen sodium (ALEVE) 220 MG tablet Take 220 mg by mouth 2 (two) times daily as needed. Arthritis    . traZODone (DESYREL) 50 MG  tablet 25-50 mg at night as needed for sleep 30 tablet 2   No current facility-administered medications for this visit.      Musculoskeletal: Strength & Muscle Tone: within normal limits Gait & Station: normal Patient leans: N/A  Psychiatric Specialty Exam: Review of Systems  Psychiatric/Behavioral: Positive for depression. Negative for hallucinations, memory loss, substance abuse and suicidal ideas. The patient is nervous/anxious and has insomnia.   All other systems reviewed and are negative.   Blood pressure 116/76, pulse 60, height 5\' 2"  (1.575 m), weight 155 lb (70.3 kg), SpO2 97 %.Body mass index is 28.35 kg/m.  General Appearance: Fairly Groomed  Eye Contact:  Good  Speech:  Clear and Coherent  Volume:  Normal  Mood:  "better"  Affect:  down at times ,but  more reactive  Thought Process:  Coherent  Orientation:  Full (Time, Place, and Person)  Thought Content: Logical   Suicidal Thoughts:  No  Homicidal Thoughts:  No  Memory:  Immediate;   Good  Judgement:  Good  Insight:  Good  Psychomotor Activity:  Normal  Concentration:  Concentration: Good and Attention Span: Good  Recall:  Good  Fund of Knowledge: Good  Language: Good  Akathisia:  No  Handed:  Right  AIMS (if indicated): not done  Assets:  Communication Skills Desire for Improvement  ADL's:  Intact  Cognition: WNL  Sleep:  Poor   Screenings:   Assessment and Plan:  [name] is a 62 y.o. 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  Psychosocial stressors including loss of her husband, and her son, who recently overdosed in jail.  Will continue Lexapro to target depression.  Will continue Ativan as needed for  anxiety.  Discussed risk of dependence and oversedation.  We will start trazodone as needed for insomnia.  Discussed risk of oversedation.  Discussed behavioral activation.   Plan I have reviewed and updated plans as below 1. Continue lexapro 20 mg daily 2. Continuelorazepam 1mg  daily as needed for anxiety  3. Start trazodone 25-50 mg at night as needed for sleep 4. Return to clinic in three months for 15 mins  3. Try melatonin 3 mg at night, 2 hours before going to the bed 3. Return to clinic  in three months for 15 mins - checked TSH by PCP in 01/2017; wnl per self report  The patient demonstrates the following risk factors for suicide: Chronic risk factors for suicide include:psychiatric disorder ofdepression, anxiety. Acute risk factorsfor suicide include: loss (financial, interpersonal, professional). Protective factorsfor this patient include: positive social support, responsibility to others (children, family), coping skills and hope for the future. Considering these factors, the overall suicide risk at this point appears to below. Patientisappropriate for outpatient follow up.  Neysa Hotter, MD 10/18/2017, 4:50 PM

## 2017-10-18 ENCOUNTER — Ambulatory Visit (INDEPENDENT_AMBULATORY_CARE_PROVIDER_SITE_OTHER): Payer: 59 | Admitting: Psychiatry

## 2017-10-18 ENCOUNTER — Encounter (HOSPITAL_COMMUNITY): Payer: Self-pay | Admitting: Psychiatry

## 2017-10-18 VITALS — BP 116/76 | HR 60 | Ht 62.0 in | Wt 155.0 lb

## 2017-10-18 DIAGNOSIS — F331 Major depressive disorder, recurrent, moderate: Secondary | ICD-10-CM

## 2017-10-18 DIAGNOSIS — Z87891 Personal history of nicotine dependence: Secondary | ICD-10-CM

## 2017-10-18 DIAGNOSIS — G4700 Insomnia, unspecified: Secondary | ICD-10-CM

## 2017-10-18 DIAGNOSIS — F419 Anxiety disorder, unspecified: Secondary | ICD-10-CM | POA: Diagnosis not present

## 2017-10-18 MED ORDER — LORAZEPAM 1 MG PO TABS: 1.0000 mg | ORAL_TABLET | Freq: Every day | ORAL | 2 refills | Status: DC | PRN

## 2017-10-18 MED ORDER — TRAZODONE HCL 50 MG PO TABS: ORAL_TABLET | ORAL | 2 refills | Status: DC

## 2017-10-18 MED ORDER — ESCITALOPRAM OXALATE 20 MG PO TABS: 20.0000 mg | ORAL_TABLET | Freq: Every day | ORAL | 0 refills | Status: DC

## 2017-10-18 NOTE — Patient Instructions (Signed)
1. Continue lexapro 20 mg daily 2. Decreaselorazepam 0.5mg  daily as needed for anxiety  3. Start trazodone 25-50 mg at night as needed for sleep 4. Return to clinic in three months for 15 mins

## 2017-10-31 DIAGNOSIS — M170 Bilateral primary osteoarthritis of knee: Secondary | ICD-10-CM | POA: Diagnosis not present

## 2017-12-28 DIAGNOSIS — M25561 Pain in right knee: Secondary | ICD-10-CM | POA: Insufficient documentation

## 2017-12-28 DIAGNOSIS — M170 Bilateral primary osteoarthritis of knee: Secondary | ICD-10-CM | POA: Diagnosis not present

## 2018-01-14 NOTE — Progress Notes (Signed)
BH MD/PA/NP OP Progress Note  01/17/2018 4:32 PM [name]  MRN:  [id number]  Chief Complaint:  Chief Complaint    Depression; Follow-up     HPI:  Patient presents for follow-up appointment for depression.  She states that she had a good Thanksgiving; she was invited for dinner with her husband's family.  She has been feeling more depressed and lacks motivation.  She states that holiday season is very difficult as she misses her husband more.  She found out that she will have knee surgery tomorrow.  She has been waiting for it as she has significant knee pain.  She is interested in changing her medication for depression.  She sleeps better after starting trazodone.  She has fair appetite and concentration.  She denies SI.  She feels anxious especially at work.  She denies panic attacks.   Per PMP,  LORAZEPAM FILLED ON 12/16/2017    Visit Diagnosis:    ICD-10-CM   1. MDD (major depressive disorder), recurrent episode, moderate (HCC) F33.1     Past Psychiatric History: Please see initial evaluation for full details. I have reviewed the history. No updates at this time.     Past Medical History:  Past Medical History:  Diagnosis Date  . Hypertension     Past Surgical History:  Procedure Laterality Date  . KNEE SURGERY      Family Psychiatric History: Please see initial evaluation for full details. I have reviewed the history. No updates at this time.     Family History:  Family History  Problem Relation Age of Onset  . Drug abuse Son     Social History:  Social History   Socioeconomic History  . Marital status: Married    Spouse name: Not on file  . Number of children: Not on file  . Years of education: Not on file  . Highest education level: Not on file  Occupational History  . Not on file  Social Needs  . Financial resource strain: Not on file  . Food insecurity:    Worry: Not on file    Inability: Not on file  . Transportation needs:   Medical: Not on file    Non-medical: Not on file  Tobacco Use  . Smoking status: Former Smoker    Packs/day: 0.75    Start date: 10/15/2017  . Smokeless tobacco: Never Used  Substance and Sexual Activity  . Alcohol use: Yes    Comment: occasional. 11-15-2016 occa  . Drug use: No    Comment: 11-15-2016 per pt no  . Sexual activity: Not on file  Lifestyle  . Physical activity:    Days per week: Not on file    Minutes per session: Not on file  . Stress: Not on file  Relationships  . Social connections:    Talks on phone: Not on file    Gets together: Not on file    Attends religious service: Not on file    Active member of club or organization: Not on file    Attends meetings of clubs or organizations: Not on file    Relationship status: Not on file  Other Topics Concern  . Not on file  Social History Narrative  . Not on file    Allergies: No Known Allergies  Metabolic Disorder Labs: No results found for: HGBA1C, MPG No results found for: PROLACTIN No results found for: CHOL, TRIG, HDL, CHOLHDL, VLDL, LDLCALC   Therapeutic Level Labs: No results found for: LITHIUM No  results found for: VALPROATE No components found for:  CBMZ  Current Medications: Current Outpatient Medications  Medication Sig Dispense Refill  . amLODipine (NORVASC) 5 MG tablet Take 5 mg by mouth daily.    Marland Kitchen. buPROPion (WELLBUTRIN XL) 150 MG 24 hr tablet Take 1 tablet (150 mg total) by mouth daily. 90 tablet 0  . celecoxib (CELEBREX) 100 MG capsule Take 100 mg by mouth 2 (two) times daily.    Marland Kitchen. escitalopram (LEXAPRO) 20 MG tablet Take 1 tablet (20 mg total) by mouth daily. 90 tablet 0  . LORazepam (ATIVAN) 1 MG tablet Take 1 tablet (1 mg total) by mouth daily as needed for anxiety. 30 tablet 2  . metoprolol tartrate (LOPRESSOR) 25 MG tablet Take 25 mg by mouth 2 (two) times daily.    . naproxen sodium (ALEVE) 220 MG tablet Take 220 mg by mouth 2 (two) times daily as needed. Arthritis    . traZODone  (DESYREL) 50 MG tablet Take 1 tablet (50 mg total) by mouth at bedtime as needed for sleep. 90 tablet 0   No current facility-administered medications for this visit.      Musculoskeletal: Strength & Muscle Tone: Good Gait & Station: normal Patient leans: N/A  Psychiatric Specialty Exam: Review of Systems  Psychiatric/Behavioral: Positive for depression. Negative for hallucinations, memory loss, substance abuse and suicidal ideas. The patient is nervous/anxious. The patient does not have insomnia.   All other systems reviewed and are negative.   Blood pressure 124/74, pulse 86, height 5\' 1"  (1.549 m), weight 162 lb 3.2 oz (73.6 kg).Body mass index is 30.65 kg/m.  General Appearance: Fairly Groomed  Eye Contact:  Good  Speech:  Clear and Coherent  Volume:  Normal  Mood:  Depressed  Affect:  Appropriate, Congruent and euthymic, reactive  Thought Process:  Coherent  Orientation:  Full (Time, Place, and Person)  Thought Content: Logical   Suicidal Thoughts:  No  Homicidal Thoughts:  No  Memory:  Immediate;   Good  Judgement:  Good  Insight:  Fair  Psychomotor Activity:  Normal  Concentration:  Concentration: Good and Attention Span: Good  Recall:  Good  Fund of Knowledge: Good  Language: Good  Akathisia:  No  Handed:  Right  AIMS (if indicated): not done  Assets:  Communication Skills Desire for Improvement  ADL's:  Intact  Cognition: WNL  Sleep:  Good   Screenings:   Assessment and Plan:  Drenda Freezeatricia M Reichl is a 62 y.o. year old female with a history of depression, COPD, hypertension, who presents for follow up appointment for MDD (major depressive disorder), recurrent episode, moderate (HCC)  # MDD, moderate, recurrent without psychotic features Patient reports slight worsening in depressive symptoms in the context of anniversary of loss of her husband.  Other psychosocial stressors including her son, who recently overdosed in jail.  Will start Wellbutrin as  adjunctive treatment for depression.  Discussed potential side effect of worsening anxiety and headache.  She has no known history of seizure.  Will continue Lexapro to target depression.  Will continue Ativan as needed for anxiety.  Discussed risk of dependence and oversedation.  Will continue trazodone as needed for insomnia.  Validated her grief.   Plan I have reviewed and updated plans as below 1. Continue lexapro 20 mg daily 2. Start Wellbutrin 150 mg daily  3. Continuelorazepam 1mg  daily as needed for anxiety  4. Continue trazodone 25-50 mg at night as needed for sleep 5. Return to clinic in three  months for 15 mins - checked TSH by PCP in 01/2017; wnl per self report  The patient demonstrates the following risk factors for suicide: Chronic risk factors for suicide include:psychiatric disorder ofdepression, anxiety. Acute risk factorsfor suicide include: loss (financial, interpersonal, professional). Protective factorsfor this patient include: positive social support, responsibility to others (children, family), coping skills and hope for the future. Considering these factors, the overall suicide risk at this point appears to below. Patientisappropriate for outpatient follow up.  Neysa Hotter, MD 01/17/2018, 4:32 PM

## 2018-01-17 ENCOUNTER — Encounter (HOSPITAL_COMMUNITY): Payer: Self-pay | Admitting: Psychiatry

## 2018-01-17 ENCOUNTER — Ambulatory Visit (INDEPENDENT_AMBULATORY_CARE_PROVIDER_SITE_OTHER): Payer: 59 | Admitting: Psychiatry

## 2018-01-17 VITALS — BP 124/74 | HR 86 | Ht 61.0 in | Wt 162.2 lb

## 2018-01-17 DIAGNOSIS — F331 Major depressive disorder, recurrent, moderate: Secondary | ICD-10-CM

## 2018-01-17 MED ORDER — BUPROPION HCL ER (XL) 150 MG PO TB24: 150.0000 mg | ORAL_TABLET | Freq: Every day | ORAL | 0 refills | Status: DC

## 2018-01-17 MED ORDER — LORAZEPAM 1 MG PO TABS: 1.0000 mg | ORAL_TABLET | Freq: Every day | ORAL | 2 refills | Status: DC | PRN

## 2018-01-17 MED ORDER — TRAZODONE HCL 50 MG PO TABS: 50.0000 mg | ORAL_TABLET | Freq: Every evening | ORAL | 0 refills | Status: DC | PRN

## 2018-01-17 MED ORDER — ESCITALOPRAM OXALATE 20 MG PO TABS: 20.0000 mg | ORAL_TABLET | Freq: Every day | ORAL | 0 refills | Status: DC

## 2018-01-17 NOTE — Patient Instructions (Signed)
1. Continue lexapro 20 mg daily 2. Start Wellbutrin 150 mg daily  3. Continuelorazepam 1mg  daily as needed for anxiety  4. Start trazodone 25-50 mg at night as needed for sleep 5. Return to clinic in three months for 15 mins

## 2018-01-18 DIAGNOSIS — M25561 Pain in right knee: Secondary | ICD-10-CM | POA: Diagnosis not present

## 2018-01-18 DIAGNOSIS — M1711 Unilateral primary osteoarthritis, right knee: Secondary | ICD-10-CM | POA: Diagnosis not present

## 2018-01-18 DIAGNOSIS — M1712 Unilateral primary osteoarthritis, left knee: Secondary | ICD-10-CM | POA: Diagnosis not present

## 2018-01-23 DIAGNOSIS — Z299 Encounter for prophylactic measures, unspecified: Secondary | ICD-10-CM | POA: Diagnosis not present

## 2018-01-23 DIAGNOSIS — I10 Essential (primary) hypertension: Secondary | ICD-10-CM | POA: Diagnosis not present

## 2018-01-23 DIAGNOSIS — J449 Chronic obstructive pulmonary disease, unspecified: Secondary | ICD-10-CM | POA: Diagnosis not present

## 2018-01-23 DIAGNOSIS — Z6830 Body mass index (BMI) 30.0-30.9, adult: Secondary | ICD-10-CM | POA: Diagnosis not present

## 2018-01-24 NOTE — Progress Notes (Signed)
Please place orders in Epic as patient has a pre-op appointment on 01/28/2018! Thank you!

## 2018-01-24 NOTE — Patient Instructions (Addendum)
[name]  01/24/2018   Your procedure is scheduled on: Tuesday 02/05/2018  [facility]              Report to admitting at   1150 AM    Call this number if you have problems the morning of surgery [phone]    Remember: Do not eat food :After Midnight.  May have clear liquids from midnight up until 0820 am then nothing until after surgery!    CLEAR LIQUID DIET   Foods Allowed                                                                     Foods Excluded  Coffee and tea, regular and decaf                             liquids that you cannot  Plain Jell-O in any flavor                                             see through such as: Fruit ices (not with fruit pulp)                                     milk, soups, orange juice  Iced Popsicles                                    All solid food Carbonated beverages, regular and diet                                    Cranberry, grape and apple juices Sports drinks like Gatorade Lightly seasoned clear broth or consume(fat free) Sugar, honey syrup  Sample Menu Breakfast                                Lunch                                     Supper Cranberry juice                    Beef broth                            Chicken broth Jell-O                                     Grape juice  Apple juice Coffee or tea                        Jell-O                                      Popsicle                                                Coffee or tea                        Coffee or tea  _____________________________________________________________________               BRUSH YOUR TEETH MORNING OF SURGERY AND RINSE YOUR MOUTH OUT, NO CHEWING GUM CANDY OR MINTS.     Take these medicines the morning of surgery with A SIP OF WATER: Amlodipine (Norvasc), Bupropion (Wellbutrin XL),  Metoprolol tartrate (Lopressor)                     You may not have any metal on your body including hair pins and              piercings  Do not wear jewelry, make-up, lotions, powders or perfumes, deodorant             Do not wear nail polish.  Do not shave  48 hours prior to surgery.                Do not bring valuables to the hospital. [name] IS NOT             RESPONSIBLE   FOR VALUABLES.  Contacts, dentures or bridgework may not be worn into surgery.  Leave suitcase in the car. After surgery it may be brought to your room.                   Please read over the following fact sheets you were given: _____________________________________________________________________             [facility] Pre[facility] Before surgery, you can play an important role.  Because skin is not sterile, your skin needs to be as free of germs as possible.  You can reduce the number of germs on your skin by washing with CHG (chlorahexidine gluconate) soap before surgery.  CHG is an antiseptic cleaner which kills germs and bonds with the skin to continue killing germs even after washing. Please DO NOT use if you have an allergy to CHG or antibacterial soaps.  If your skin becomes reddened/irritated stop using the CHG and inform your nurse when you arrive at Short Stay. Do not shave (including legs and underarms) for at least 48 hours prior to the first CHG shower.  You may shave your face/neck. Please follow these instructions carefully:  1.  Shower with CHG Soap the night before surgery and the  morning of Surgery.  2.  If you choose to wash your hair, wash your hair first as usual with your  normal  shampoo.  3.  After you shampoo, rinse your hair and body thoroughly to remove the  shampoo.  4.  Use CHG as you would any other liquid soap.  You can apply chg directly  to the skin and wash                       Gently with a scrungie or clean washcloth.  5.  Apply the CHG Soap to your body ONLY FROM THE NECK DOWN.    Do not use on face/ open                           Wound or open sores. Avoid contact with eyes, ears mouth and genitals (private parts).                       Wash face,  Genitals (private parts) with your normal soap.             6.  Wash thoroughly, paying special attention to the area where your surgery  will be performed.  7.  Thoroughly rinse your body with warm water from the neck down.  8.  DO NOT shower/wash with your normal soap after using and rinsing off  the CHG Soap.                9.  Pat yourself dry with a clean towel.            10.  Wear clean pajamas.            11.  Place clean sheets on your bed the night of your first shower and do not  sleep with pets. Day of Surgery : Do not apply any lotions/deodorants the morning of surgery.  Please wear clean clothes to the hospital/surgery center.  FAILURE TO FOLLOW THESE INSTRUCTIONS MAY RESULT IN THE CANCELLATION OF YOUR SURGERY PATIENT SIGNATURE_________________________________  NURSE SIGNATURE__________________________________  ________________________________________________________________________   [name]  An incentive spirometer is a tool that can help keep your lungs clear and active. This tool measures how well you are filling your lungs with each breath. Taking long deep breaths may help reverse or decrease the chance of developing breathing (pulmonary) problems (especially infection) following:  A long period of time when you are unable to move or be active. BEFORE THE PROCEDURE   If the spirometer includes an indicator to show your best effort, your nurse or respiratory therapist will set it to a desired goal.  If possible, sit up straight or lean slightly forward. Try not to slouch.  Hold the incentive spirometer in an upright position. INSTRUCTIONS FOR USE  1. Sit on the edge of your bed if possible, or sit up as far as you can in bed or on a chair. 2. Hold the incentive spirometer in an  upright position. 3. Breathe out normally. 4. Place the mouthpiece in your mouth and seal your lips tightly around it. 5. Breathe in slowly and as deeply as possible, raising the piston or the ball toward the top of the column. 6. Hold your breath for 3-5 seconds or for as long as possible. Allow the piston or ball to fall to the bottom of the column. 7. Remove the mouthpiece from your mouth and breathe out normally. 8. Rest for a few seconds and repeat Steps 1 through 7 at least 10 times every 1-2 hours when you are awake. Take your time and take a few normal breaths between deep breaths. 9. The spirometer may include an indicator to  show your best effort. Use the indicator as a goal to work toward during each repetition. 10. After each set of 10 deep breaths, practice coughing to be sure your lungs are clear. If you have an incision (the cut made at the time of surgery), support your incision when coughing by placing a pillow or rolled up towels firmly against it. Once you are able to get out of bed, walk around indoors and cough well. You may stop using the incentive spirometer when instructed by your caregiver.  RISKS AND COMPLICATIONS  Take your time so you do not get dizzy or light-headed.  If you are in pain, you may need to take or ask for pain medication before doing incentive spirometry. It is harder to take a deep breath if you are having pain. AFTER USE  Rest and breathe slowly and easily.  It can be helpful to keep track of a log of your progress. Your caregiver can provide you with a simple table to help with this. If you are using the spirometer at home, follow these instructions: [place] IF:   You are having difficultly using the spirometer.  You have trouble using the spirometer as often as instructed.  Your pain medication is not giving enough relief while using the spirometer.  You develop fever of 100.5 F (38.1 C) or higher. SEEK IMMEDIATE MEDICAL CARE  IF:   You cough up bloody sputum that had not been present before.  You develop fever of 102 F (38.9 C) or greater.  You develop worsening pain at or near the incision site. MAKE SURE YOU:   Understand these instructions.  Will watch your condition.  Will get help right away if you are not doing well or get worse. Document Released: 06/05/2006 Document Revised: 04/17/2011 Document Reviewed: 08/06/2006 [employer] Patient Information 2014 [employer], [place].   ________________________________________________________________________  WHAT IS A BLOOD TRANSFUSION? Blood Transfusion Information  A transfusion is the replacement of blood or some of its parts. Blood is made up of multiple cells which provide different functions.  Red blood cells carry oxygen and are used for blood loss replacement.  White blood cells fight against infection.  Platelets control bleeding.  Plasma helps clot blood.  Other blood products are available for specialized needs, such as hemophilia or other clotting disorders. BEFORE THE TRANSFUSION  Who gives blood for transfusions?   Healthy volunteers who are fully evaluated to make sure their blood is safe. This is blood bank blood. Transfusion therapy is the safest it has ever been in the practice of medicine. Before blood is taken from a donor, a complete history is taken to make sure that person has no history of diseases nor engages in risky social behavior (examples are intravenous drug use or sexual activity with multiple partners). The donor's travel history is screened to minimize risk of transmitting infections, such as malaria. The donated blood is tested for signs of infectious diseases, such as HIV and hepatitis. The blood is then tested to be sure it is compatible with you in order to minimize the chance of a transfusion reaction. If you or a relative donates blood, this is often done in anticipation of surgery and is not appropriate for emergency  situations. It takes many days to process the donated blood. RISKS AND COMPLICATIONS Although transfusion therapy is very safe and saves many lives, the main dangers of transfusion include:   Getting an infectious disease.  Developing a transfusion reaction. This is an allergic reaction  to something in the blood you were given. Every precaution is taken to prevent this. The decision to have a blood transfusion has been considered carefully by your caregiver before blood is given. Blood is not given unless the benefits outweigh the risks. AFTER THE TRANSFUSION  Right after receiving a blood transfusion, you will usually feel much better and more energetic. This is especially true if your red blood cells have gotten low (anemic). The transfusion raises the level of the red blood cells which carry oxygen, and this usually causes an energy increase.  The nurse administering the transfusion will monitor you carefully for complications. HOME CARE INSTRUCTIONS  No special instructions are needed after a transfusion. You may find your energy is better. Speak with your caregiver about any limitations on activity for underlying diseases you may have. SEEK MEDICAL CARE IF:   Your condition is not improving after your transfusion.  You develop redness or irritation at the intravenous (IV) site. SEEK IMMEDIATE MEDICAL CARE IF:  Any of the following symptoms occur over the next 12 hours:  Shaking chills.  You have a temperature by mouth above 102 F (38.9 C), not controlled by medicine.  Chest, back, or muscle pain.  People around you feel you are not acting correctly or are confused.  Shortness of breath or difficulty breathing.  Dizziness and fainting.  You get a rash or develop hives.  You have a decrease in urine output.  Your urine turns a dark color or changes to pink, red, or brown. Any of the following symptoms occur over the next 10 days:  You have a temperature by mouth above  102 F (38.9 C), not controlled by medicine.  Shortness of breath.  Weakness after normal activity.  The white part of the eye turns yellow (jaundice).  You have a decrease in the amount of urine or are urinating less often.  Your urine turns a dark color or changes to pink, red, or brown. Document Released: 01/21/2000 Document Revised: 04/17/2011 Document Reviewed: 09/09/2007 [facility] Patient Information 2014 [place], [place].  _______________________________________________________________________

## 2018-01-25 NOTE — H&P (Signed)
TOTAL KNEE ADMISSION H&P  Patient is being admitted for left total knee arthroplasty.  Subjective:  Chief Complaint:  Left knee primary OA / pain  HPI: [name], 62 y.o. female, has a history of pain and functional disability in the left knee due to arthritis and has failed non-surgical conservative treatments for greater than 12 weeks to include NSAID's and/or analgesics, corticosteriod injections, viscosupplementation injections and activity modification.  Onset of symptoms was gradual, starting 2+ years ago with gradually worsening course since that time. The patient noted no past surgery on the left knee(s).  Patient currently rates pain in the left knee(s) at 10 out of 10 with activity. Patient has night pain, worsening of pain with activity and weight bearing, pain that interferes with activities of daily living, pain with passive range of motion, crepitus and joint swelling.  Patient has evidence of periarticular osteophytes and joint space narrowing by imaging studies.  There is no active infection.  Risks, benefits and expectations were discussed with the patient.  Risks including but not limited to the risk of anesthesia, blood clots, nerve damage, blood vessel damage, failure of the prosthesis, infection and up to and including death.  Patient understand the risks, benefits and expectations and wishes to proceed with surgery.   PCP: Ernestine ConradBluth, Kirk, MD  D/C Plans:       Home   Post-op Meds:       No Rx given   Tranexamic Acid:      To be given - IV   Decadron:      Is to be given  FYI:      ASA  Norco  DME:   Pt already has equipment  PT:   OPPT     Patient Active Problem List   Diagnosis Date Noted  . MDD (major depressive disorder), recurrent episode, moderate (HCC) 11/15/2016  . DEGENERATIVE JOINT DISEASE, KNEE 01/17/2007  . HIGH BLOOD PRESSURE 01/17/2007   Past Medical History:  Diagnosis Date  . Hypertension     Past Surgical History:  Procedure  Laterality Date  . KNEE SURGERY      No current facility-administered medications for this encounter.    Current Outpatient Medications  Medication Sig Dispense Refill Last Dose  . acetaminophen (TYLENOL) 500 MG tablet Take 1,000 mg by mouth every 6 (six) hours as needed for moderate pain or headache.     Marland Kitchen. amLODipine (NORVASC) 10 MG tablet Take 10 mg by mouth daily.    Taking  . buPROPion (WELLBUTRIN XL) 150 MG 24 hr tablet Take 1 tablet (150 mg total) by mouth daily. 90 tablet 0   . celecoxib (CELEBREX) 100 MG capsule Take 100 mg by mouth 2 (two) times daily.   Taking  . escitalopram (LEXAPRO) 20 MG tablet Take 1 tablet (20 mg total) by mouth daily. (Patient taking differently: Take 20 mg by mouth at bedtime. ) 90 tablet 0   . LORazepam (ATIVAN) 1 MG tablet Take 1 tablet (1 mg total) by mouth daily as needed for anxiety. 30 tablet 2   . metoprolol tartrate (LOPRESSOR) 25 MG tablet Take 25 mg by mouth 2 (two) times daily.   Taking  . traZODone (DESYREL) 50 MG tablet Take 1 tablet (50 mg total) by mouth at bedtime as needed for sleep. 90 tablet 0    Allergies  Allergen Reactions  . Percocet [Oxycodone-Acetaminophen] Nausea And Vomiting    Social History   Tobacco Use  . Smoking status: Former Smoker  Packs/day: 0.75    Start date: 10/15/2017  . Smokeless tobacco: Never Used  Substance Use Topics  . Alcohol use: Yes    Comment: occasional. 11-15-2016 occa    Family History  Problem Relation Age of Onset  . Drug abuse Son      Review of Systems  Constitutional: Negative.   HENT: Negative.   Eyes: Negative.   Respiratory: Negative.   Cardiovascular: Negative.   Gastrointestinal: Negative.   Genitourinary: Negative.   Musculoskeletal: Positive for joint pain.  Skin: Negative.   Neurological: Negative.   Endo/Heme/Allergies: Negative.   Psychiatric/Behavioral: Positive for depression.    Objective:  Physical Exam  Constitutional: She is oriented to person, place, and  time. She appears well-developed.  HENT:  Head: Normocephalic.  Eyes: Pupils are equal, round, and reactive to light.  Neck: Neck supple. No JVD present. No tracheal deviation present. No thyromegaly present.  Cardiovascular: Normal rate, regular rhythm and intact distal pulses.  Respiratory: Effort normal and breath sounds normal. No respiratory distress. She has no wheezes.  GI: Soft. There is no abdominal tenderness. There is no guarding.  Musculoskeletal:     Left knee: She exhibits decreased range of motion, swelling and bony tenderness. She exhibits no ecchymosis, no deformity, no laceration and no erythema. Tenderness found.  Lymphadenopathy:    She has no cervical adenopathy.  Neurological: She is alert and oriented to person, place, and time.  Skin: Skin is warm and dry.  Psychiatric: She has a normal mood and affect.      Labs:  Estimated body mass index is 30.65 kg/m as calculated from the following:   Height as of 01/17/18: 5\' 1"  (1.549 m).   Weight as of 01/17/18: 73.6 kg.   Imaging Review Plain radiographs demonstrate severe degenerative joint disease of the left knee. The bone quality appears to be good for age and reported activity level.   Preoperative templating of the joint replacement has been completed, documented, and submitted to the Operating Room personnel in order to optimize intra-operative equipment management.    Patient's anticipated LOS is less than 2 midnights, meeting these requirements: - Younger than 3565 - Lives within 1 hour of care - Has a competent adult at home to recover with post-op recover - NO history of  - Chronic pain requiring opiods  - Diabetes  - Coronary Artery Disease  - Heart failure  - Heart attack  - Stroke  - DVT/VTE  - Cardiac arrhythmia  - Respiratory Failure/COPD  - Renal failure  - Anemia  - Advanced Liver disease    Assessment/Plan:  End stage arthritis, left knee   The patient history, physical  examination, clinical judgment of the provider and imaging studies are consistent with end stage degenerative joint disease of the left knee(s) and total knee arthroplasty is deemed medically necessary. The treatment options including medical management, injection therapy arthroscopy and arthroplasty were discussed at length. The risks and benefits of total knee arthroplasty were presented and reviewed. The risks due to aseptic loosening, infection, stiffness, patella tracking problems, thromboembolic complications and other imponderables were discussed. The patient acknowledged the explanation, agreed to proceed with the plan and consent was signed. Patient is being admitted for inpatient treatment for surgery, pain control, PT, OT, prophylactic antibiotics, VTE prophylaxis, progressive ambulation and ADL's and discharge planning. The patient is planning to be discharged home.      Anastasio AuerbachMatthew S. Fidencia Mccloud   PA-C  01/25/2018, 11:52 AM

## 2018-01-28 ENCOUNTER — Encounter (HOSPITAL_COMMUNITY)
Admission: RE | Admit: 2018-01-28 | Discharge: 2018-01-28 | Disposition: A | Discharge status: Home / Self Care | Payer: 59 | Source: Ambulatory Visit | Attending: Orthopedic Surgery | Admitting: Orthopedic Surgery

## 2018-01-28 ENCOUNTER — Other Ambulatory Visit: Payer: Self-pay

## 2018-01-28 ENCOUNTER — Encounter (HOSPITAL_COMMUNITY): Payer: Self-pay | Admitting: *Deleted

## 2018-01-28 DIAGNOSIS — Z01818 Encounter for other preprocedural examination: Secondary | ICD-10-CM | POA: Insufficient documentation

## 2018-01-28 DIAGNOSIS — R9431 Abnormal electrocardiogram [ECG] [EKG]: Secondary | ICD-10-CM | POA: Diagnosis not present

## 2018-01-28 DIAGNOSIS — I10 Essential (primary) hypertension: Secondary | ICD-10-CM | POA: Insufficient documentation

## 2018-01-28 DIAGNOSIS — M1712 Unilateral primary osteoarthritis, left knee: Secondary | ICD-10-CM | POA: Insufficient documentation

## 2018-01-28 HISTORY — DX: Depression, unspecified: F32.A

## 2018-01-28 HISTORY — DX: Major depressive disorder, single episode, unspecified: F32.9

## 2018-01-28 HISTORY — DX: Unspecified osteoarthritis, unspecified site: M19.90

## 2018-01-28 HISTORY — DX: Anxiety disorder, unspecified: F41.9

## 2018-01-28 HISTORY — DX: Other specified postprocedural states: R11.2

## 2018-01-28 HISTORY — DX: Other specified postprocedural states: Z98.890

## 2018-01-28 HISTORY — DX: Adverse effect of unspecified anesthetic, initial encounter: T41.45XA

## 2018-01-28 HISTORY — DX: Other complications of anesthesia, initial encounter: T88.59XA

## 2018-01-28 LAB — SURGICAL PCR SCREEN
MRSA, PCR: NEGATIVE
Staphylococcus aureus: NEGATIVE

## 2018-01-28 LAB — BASIC METABOLIC PANEL
Anion gap: 9 (ref 5–15)
BUN: 14 mg/dL (ref 8–23)
CO2: 24 mmol/L (ref 22–32)
Calcium: 9.1 mg/dL (ref 8.9–10.3)
Chloride: 105 mmol/L (ref 98–111)
Creatinine, Ser: 0.59 mg/dL (ref 0.44–1.00)
GFR calc Af Amer: >60 mL/min (ref >60)
GFR calc non Af Amer: >60 mL/min (ref >60)
Glucose, Bld: 109 mg/dL — ABNORMAL HIGH (ref 70–99)
Potassium: 3.7 mmol/L (ref 3.5–5.1)
Sodium: 138 mmol/L (ref 135–145)

## 2018-01-28 LAB — CBC
HCT: 37.6 % (ref 36.0–46.0)
HEMOGLOBIN: 12.7 g/dL (ref 12.0–15.0)
MCH: 30.5 pg (ref 26.0–34.0)
MCHC: 33.8 g/dL (ref 30.0–36.0)
MCV: 90.4 fL (ref 80.0–100.0)
Platelets: 246 10*3/uL (ref 150–400)
RBC: 4.16 MIL/uL (ref 3.87–5.11)
RDW: 11.9 % (ref 11.5–15.5)
WBC: 5.1 10*3/uL (ref 4.0–10.5)
nRBC: 0 % (ref 0.0–0.2)

## 2018-01-28 LAB — ABO/RH: ABO/RH(D): A POS

## 2018-01-31 ENCOUNTER — Encounter (HOSPITAL_COMMUNITY): Payer: Self-pay | Admitting: Physician Assistant

## 2018-01-31 NOTE — Progress Notes (Signed)
Called and LM on VM for Rosalva FerronSherry Wills, scheduler for Dr. Charlann Boxerlin that patient needs Cardiac Clearance for abnormal EKG per Dr. Judie PetitM. Foster.

## 2018-01-31 NOTE — Progress Notes (Signed)
Consulted with Dr. Judie PetitM.Foster, Anesthesia face to face about results from EKG done on 01/28/2018 and reviewed patient's medical history with him. Per Dr. Malen GauzeFoster, patient needs Cardiac Clearance.

## 2018-01-31 NOTE — Progress Notes (Signed)
Chart left with Jessica Zanetto, PA for follow-up. 

## 2018-01-31 NOTE — Progress Notes (Signed)
Anesthesia Chart Review   Case:  [id number] Date/Time:  02/05/18 1405   Procedure:  TOTAL KNEE ARTHROPLASTY (Left ) - 70 mins   Anesthesia type:  Spinal   Pre-op diagnosis:  Left knee osteoarthritis   Location:  [facility] ROOM 10 / [facility]   Surgeon:  Durene Romanslin, Matthew, MD      DISCUSSION: 62 yo current every day smoker with h/o PNOV, HTN, anxiety, and depression scheduled for above surgery on 02/05/18 with Dr. Charlann Boxerlin.   Pt was cleared by PCP, Dr. Kirstie PeriAshish Shah, last office visit 01/23/18. Per his note, " pt medically stable for TKR left knee".     At PST visit on 01/28/18 EKG with findings of normal sinus rhythm, ST and T wave abnormality, consider anterolateral ischemia-more prominent since previous EKG.  Previous EKG from 2009 (on [facility]) with similar findings, although report from 01/28/18 states more prominent since last EKG.  EKG presented to Dr. Malen GauzeFoster by the nurse.  Dr. Malen GauzeFoster recommended cardiac clearance prior to proceeding with surgery.  Surgery scheduler for Dr. Charlann Boxerlin made aware, voicemail left.   Case pending per cardiac evaluation.    Addendum 02/01/18: Case has been cancelled at this time.   VS: BP 132/84   Pulse 76   Temp 36.9 C (Oral)   Resp 18   Ht 5\' 2"  (1.575 m)   Wt 71.4 kg   SpO2 98%   BMI 28.81 kg/m   PROVIDERS: Kirstie PeriShah, Ashish, MD is PCP   LABS: Labs reviewed: Acceptable for surgery. (all labs ordered are listed, but only abnormal results are displayed)  Labs Reviewed  BASIC METABOLIC PANEL - Abnormal; Notable for the following components:      Result Value   Glucose, Bld 109 (*)    All other components within normal limits  SURGICAL PCR SCREEN  CBC  TYPE AND SCREEN  ABO/RH     IMAGES:   EKG:   01/28/18 Rate 69bpm Normal sinus rhythm  ST & T wave abnormality, consider anterolateral ischemia-more prominent since previous Abnormal EKG    CV:  Past Medical History:  Diagnosis Date  . Anxiety   . Arthritis   . Complication of anesthesia   .  Depression   . Hypertension   . PONV (postoperative nausea and vomiting)     Past Surgical History:  Procedure Laterality Date  . KNEE SURGERY      MEDICATIONS: . acetaminophen (TYLENOL) 500 MG tablet  . amLODipine (NORVASC) 10 MG tablet  . buPROPion (WELLBUTRIN XL) 150 MG 24 hr tablet  . celecoxib (CELEBREX) 100 MG capsule  . escitalopram (LEXAPRO) 20 MG tablet  . LORazepam (ATIVAN) 1 MG tablet  . metoprolol tartrate (LOPRESSOR) 25 MG tablet  . traZODone (DESYREL) 50 MG tablet   No current facility-administered medications for this encounter.     Jodell CiproJessica Lylah Lantis, PA-C [facility] [phone] 01/31/18 1:16 PM

## 2018-02-01 DIAGNOSIS — J069 Acute upper respiratory infection, unspecified: Secondary | ICD-10-CM | POA: Diagnosis not present

## 2018-02-01 DIAGNOSIS — Z6830 Body mass index (BMI) 30.0-30.9, adult: Secondary | ICD-10-CM | POA: Diagnosis not present

## 2018-02-01 DIAGNOSIS — M25569 Pain in unspecified knee: Secondary | ICD-10-CM | POA: Diagnosis not present

## 2018-02-01 DIAGNOSIS — I10 Essential (primary) hypertension: Secondary | ICD-10-CM | POA: Diagnosis not present

## 2018-02-01 DIAGNOSIS — J449 Chronic obstructive pulmonary disease, unspecified: Secondary | ICD-10-CM | POA: Diagnosis not present

## 2018-02-04 DIAGNOSIS — Z0000 Encounter for general adult medical examination without abnormal findings: Secondary | ICD-10-CM | POA: Diagnosis not present

## 2018-02-05 ENCOUNTER — Inpatient Hospital Stay (HOSPITAL_COMMUNITY): Admission: RE | Admit: 2018-02-05 | Payer: 59 | Source: Home / Self Care | Admitting: Orthopedic Surgery

## 2018-02-05 ENCOUNTER — Encounter (HOSPITAL_COMMUNITY): Admission: RE | Payer: Self-pay | Source: Home / Self Care

## 2018-02-05 DIAGNOSIS — Z79899 Other long term (current) drug therapy: Secondary | ICD-10-CM | POA: Diagnosis not present

## 2018-02-05 DIAGNOSIS — I10 Essential (primary) hypertension: Secondary | ICD-10-CM | POA: Diagnosis not present

## 2018-02-05 DIAGNOSIS — Z0000 Encounter for general adult medical examination without abnormal findings: Secondary | ICD-10-CM | POA: Diagnosis not present

## 2018-02-05 LAB — TYPE AND SCREEN
ABO/RH(D): A POS
Antibody Screen: NEGATIVE

## 2018-02-05 SURGERY — ARTHROPLASTY, KNEE, TOTAL
Anesthesia: Spinal | Laterality: Left

## 2018-02-11 DIAGNOSIS — M25561 Pain in right knee: Secondary | ICD-10-CM | POA: Diagnosis not present

## 2018-02-11 DIAGNOSIS — R079 Chest pain, unspecified: Secondary | ICD-10-CM | POA: Diagnosis not present

## 2018-02-11 DIAGNOSIS — M25562 Pain in left knee: Secondary | ICD-10-CM | POA: Diagnosis not present

## 2018-02-15 DIAGNOSIS — I510 Cardiac septal defect, acquired: Secondary | ICD-10-CM | POA: Diagnosis not present

## 2018-02-15 DIAGNOSIS — Z01818 Encounter for other preprocedural examination: Secondary | ICD-10-CM | POA: Diagnosis not present

## 2018-02-27 NOTE — Patient Instructions (Signed)
[name]  02/27/2018   Your procedure is scheduled on: 03-05-18    Report to [facility] Main  Entrance                Report to admitting at           1030  AM    Call this number if you have problems the morning of surgery [phone]    Remember: Do not eat food or drink liquids :After Midnight. BRUSH YOUR TEETH MORNING OF SURGERY AND RINSE YOUR MOUTH OUT, NO CHEWING GUM CANDY OR MINTS.     Take these medicines the morning of surgery with A SIP OF WATER: metoprolol, wellbutrin, amlodipine                                You may not have any metal on your body including hair pins and              piercings  Do not wear jewelry, make-up, lotions, powders or perfumes, deodorant             Do not wear nail polish.  Do not shave  48 hours prior to surgery.             Do not bring valuables to the hospital. [facility] IS NOT             RESPONSIBLE   FOR VALUABLES.  Contacts, dentures or bridgework may not be worn into surgery.  Leave suitcase in the car. After surgery it may be brought to your room.                  Please read over the following fact sheets you were given: _____________________________________________________________________          [facility] - Preparing for Surgery Before surgery, you can play an important role.  Because skin is not sterile, your skin needs to be as free of germs as possible.  You can reduce the number of germs on your skin by washing with CHG (chlorahexidine gluconate) soap before surgery.  CHG is an antiseptic cleaner which kills germs and bonds with the skin to continue killing germs even after washing. Please DO NOT use if you have an allergy to CHG or antibacterial soaps.  If your skin becomes reddened/irritated stop using the CHG and inform your nurse when you arrive at Short Stay. Do not shave (including legs and underarms) for at least 48 hours prior to the first CHG shower.  You may shave your  face/neck. Please follow these instructions carefully:  1.  Shower with CHG Soap the night before surgery and the  morning of Surgery.  2.  If you choose to wash your hair, wash your hair first as usual with your  normal  shampoo.  3.  After you shampoo, rinse your hair and body thoroughly to remove the  shampoo.                           4.  Use CHG as you would any other liquid soap.  You can apply chg directly  to the skin and wash                       Gently with a scrungie  or clean washcloth.  5.  Apply the CHG Soap to your body ONLY FROM THE NECK DOWN.   Do not use on face/ open                           Wound or open sores. Avoid contact with eyes, ears mouth and genitals (private parts).                       Wash face,  Genitals (private parts) with your normal soap.             6.  Wash thoroughly, paying special attention to the area where your surgery  will be performed.  7.  Thoroughly rinse your body with warm water from the neck down.  8.  DO NOT shower/wash with your normal soap after using and rinsing off  the CHG Soap.                9.  Pat yourself dry with a clean towel.            10.  Wear clean pajamas.            11.  Place clean sheets on your bed the night of your first shower and do not  sleep with pets. Day of Surgery : Do not apply any lotions/deodorants the morning of surgery.  Please wear clean clothes to the hospital/surgery center.  FAILURE TO FOLLOW THESE INSTRUCTIONS MAY RESULT IN THE CANCELLATION OF YOUR SURGERY PATIENT SIGNATURE_________________________________  NURSE SIGNATURE__________________________________  ________________________________________________________________________  WHAT IS A BLOOD TRANSFUSION? Blood Transfusion Information  A transfusion is the replacement of blood or some of its parts. Blood is made up of multiple cells which provide different functions.  Red blood cells carry oxygen and are used for blood loss  replacement.  White blood cells fight against infection.  Platelets control bleeding.  Plasma helps clot blood.  Other blood products are available for specialized needs, such as hemophilia or other clotting disorders. BEFORE THE TRANSFUSION  Who gives blood for transfusions?   Healthy volunteers who are fully evaluated to make sure their blood is safe. This is blood bank blood. Transfusion therapy is the safest it has ever been in the practice of medicine. Before blood is taken from a donor, a complete history is taken to make sure that person has no history of diseases nor engages in risky social behavior (examples are intravenous drug use or sexual activity with multiple partners). The donor's travel history is screened to minimize risk of transmitting infections, such as malaria. The donated blood is tested for signs of infectious diseases, such as HIV and hepatitis. The blood is then tested to be sure it is compatible with you in order to minimize the chance of a transfusion reaction. If you or a relative donates blood, this is often done in anticipation of surgery and is not appropriate for emergency situations. It takes many days to process the donated blood. RISKS AND COMPLICATIONS Although transfusion therapy is very safe and saves many lives, the main dangers of transfusion include:   Getting an infectious disease.  Developing a transfusion reaction. This is an allergic reaction to something in the blood you were given. Every precaution is taken to prevent this. The decision to have a blood transfusion has been considered carefully by your caregiver before blood is given. Blood is not given unless the benefits outweigh the risks. AFTER THE  TRANSFUSION  Right after receiving a blood transfusion, you will usually feel much better and more energetic. This is especially true if your red blood cells have gotten low (anemic). The transfusion raises the level of the red blood cells which  carry oxygen, and this usually causes an energy increase.  The nurse administering the transfusion will monitor you carefully for complications. HOME CARE INSTRUCTIONS  No special instructions are needed after a transfusion. You may find your energy is better. Speak with your caregiver about any limitations on activity for underlying diseases you may have. SEEK MEDICAL CARE IF:   Your condition is not improving after your transfusion.  You develop redness or irritation at the intravenous (IV) site. SEEK IMMEDIATE MEDICAL CARE IF:  Any of the following symptoms occur over the next 12 hours:  Shaking chills.  You have a temperature by mouth above 102 F (38.9 C), not controlled by medicine.  Chest, back, or muscle pain.  People around you feel you are not acting correctly or are confused.  Shortness of breath or difficulty breathing.  Dizziness and fainting.  You get a rash or develop hives.  You have a decrease in urine output.  Your urine turns a dark color or changes to pink, red, or brown. Any of the following symptoms occur over the next 10 days:  You have a temperature by mouth above 102 F (38.9 C), not controlled by medicine.  Shortness of breath.  Weakness after normal activity.  The white part of the eye turns yellow (jaundice).  You have a decrease in the amount of urine or are urinating less often.  Your urine turns a dark color or changes to pink, red, or brown. Document Released: 01/21/2000 Document Revised: 04/17/2011 Document Reviewed: 09/09/2007 ExitCare Patient Information 2014 [place].  _______________________________________________________________________  Incentive Spirometer  An incentive spirometer is a tool that can help keep your lungs clear and active. This tool measures how well you are filling your lungs with each breath. Taking long deep breaths may help reverse or decrease the chance of developing breathing (pulmonary) problems  (especially infection) following:  A long period of time when you are unable to move or be active. BEFORE THE PROCEDURE   If the spirometer includes an indicator to show your best effort, your nurse or respiratory therapist will set it to a desired goal.  If possible, sit up straight or lean slightly forward. Try not to slouch.  Hold the incentive spirometer in an upright position. INSTRUCTIONS FOR USE  1. Sit on the edge of your bed if possible, or sit up as far as you can in bed or on a chair. 2. Hold the incentive spirometer in an upright position. 3. Breathe out normally. 4. Place the mouthpiece in your mouth and seal your lips tightly around it. 5. Breathe in slowly and as deeply as possible, raising the piston or the ball toward the top of the column. 6. Hold your breath for 3-5 seconds or for as long as possible. Allow the piston or ball to fall to the bottom of the column. 7. Remove the mouthpiece from your mouth and breathe out normally. 8. Rest for a few seconds and repeat Steps 1 through 7 at least 10 times every 1-2 hours when you are awake. Take your time and take a few normal breaths between deep breaths. 9. The spirometer may include an indicator to show your best effort. Use the indicator as a goal to work toward during each repetition.  10. After each set of 10 deep breaths, practice coughing to be sure your lungs are clear. If you have an incision (the cut made at the time of surgery), support your incision when coughing by placing a pillow or rolled up towels firmly against it. Once you are able to get out of bed, walk around indoors and cough well. You may stop using the incentive spirometer when instructed by your caregiver.  RISKS AND COMPLICATIONS  Take your time so you do not get dizzy or light-headed.  If you are in pain, you may need to take or ask for pain medication before doing incentive spirometry. It is harder to take a deep breath if you are having  pain. AFTER USE  Rest and breathe slowly and easily.  It can be helpful to keep track of a log of your progress. Your caregiver can provide you with a simple table to help with this. If you are using the spirometer at home, follow these instructions: [place] IF:   You are having difficultly using the spirometer.  You have trouble using the spirometer as often as instructed.  Your pain medication is not giving enough relief while using the spirometer.  You develop fever of 100.5 F (38.1 C) or higher. SEEK IMMEDIATE MEDICAL CARE IF:   You cough up bloody sputum that had not been present before.  You develop fever of 102 F (38.9 C) or greater.  You develop worsening pain at or near the incision site. MAKE SURE YOU:   Understand these instructions.  Will watch your condition.  Will get help right away if you are not doing well or get worse. Document Released: 06/05/2006 Document Revised: 04/17/2011 Document Reviewed: 08/06/2006 [facility] Patient Information 2014 [place], [place].   ________________________________________________________________________

## 2018-02-27 NOTE — Progress Notes (Signed)
Please place orders in epic . Pt. Has a preop 03-01-08 @ 0800 am Thank You!

## 2018-03-01 ENCOUNTER — Encounter (HOSPITAL_COMMUNITY): Payer: Self-pay

## 2018-03-01 ENCOUNTER — Encounter (HOSPITAL_COMMUNITY)
Admission: RE | Admit: 2018-03-01 | Discharge: 2018-03-01 | Disposition: A | Discharge status: Home / Self Care | Payer: 59 | Source: Ambulatory Visit | Attending: Orthopedic Surgery | Admitting: Orthopedic Surgery

## 2018-03-01 ENCOUNTER — Other Ambulatory Visit: Payer: Self-pay

## 2018-03-01 DIAGNOSIS — Z01812 Encounter for preprocedural laboratory examination: Secondary | ICD-10-CM | POA: Diagnosis not present

## 2018-03-01 LAB — CBC
HCT: 38.3 % (ref 36.0–46.0)
Hemoglobin: 12.7 g/dL (ref 12.0–15.0)
MCH: 31 pg (ref 26.0–34.0)
MCHC: 33.2 g/dL (ref 30.0–36.0)
MCV: 93.4 fL (ref 80.0–100.0)
Platelets: 232 10*3/uL (ref 150–400)
RBC: 4.1 MIL/uL (ref 3.87–5.11)
RDW: 12.4 % (ref 11.5–15.5)
WBC: 5.1 10*3/uL (ref 4.0–10.5)
nRBC: 0 % (ref 0.0–0.2)

## 2018-03-01 LAB — SURGICAL PCR SCREEN
MRSA, PCR: NEGATIVE
Staphylococcus aureus: NEGATIVE

## 2018-03-01 LAB — BASIC METABOLIC PANEL
ANION GAP: 7 (ref 5–15)
BUN: 19 mg/dL (ref 8–23)
CO2: 26 mmol/L (ref 22–32)
Calcium: 9 mg/dL (ref 8.9–10.3)
Chloride: 106 mmol/L (ref 98–111)
Creatinine, Ser: 0.75 mg/dL (ref 0.44–1.00)
GFR calc Af Amer: >60 mL/min (ref >60)
GFR calc non Af Amer: >60 mL/min (ref >60)
GLUCOSE: 104 mg/dL — AB (ref 70–99)
Potassium: 4.4 mmol/L (ref 3.5–5.1)
Sodium: 139 mmol/L (ref 135–145)

## 2018-03-01 NOTE — Progress Notes (Signed)
Clearance from Cardiology 02-11-18 Dr. Andrey Farmer in [place] on chart

## 2018-03-01 NOTE — Progress Notes (Signed)
ekg 01-28-18 epic 

## 2018-03-04 NOTE — Progress Notes (Signed)
Anesthesia Chart Review   Case:  [id number] Date/Time:  03/05/18 1240   Procedure:  TOTAL KNEE ARTHROPLASTY (Left ) - 70 minutes   Anesthesia type:  Spinal   Pre-op diagnosis:  left knee osteoarthritis   Location:  [facility] ROOM 09 / [facility]   Surgeon:  Durene Romans, MD      DISCUSSION: 63 yo current every day smoker with h/o PONV, anxiety, depression, HTN, left knee OA scheduled for above surgery on 02/2818 with Dr. Durene Romans.   Pt seen by cardiologist , Dr. Eppie Gibson, on 02/11/2018.  She has been prescribed Chantix working on smoking cessation.  Per Dr. Oliver Hum note (on Care Everywhere), stress test ordered prior to cardiac clearance.  Stress test 02/13/2018 with low risk findings.  Per Dr. Tenny Craw, "can proceed to knee surgery as scheduled."   Pt can proceed with planned procedure barring acute status change.  VS: BP 126/74   Pulse (!) 56   Temp 37.2 C (Oral)   Resp 16   Ht 5\' 1"  (1.549 m)   Wt 71.2 kg   SpO2 98%   BMI 29.66 kg/m   PROVIDERS: Kirstie Peri, MD is PCP   Marlaine Hind, MD is Cardiologist  LABS: Labs reviewed: Acceptable for surgery. (all labs ordered are listed, but only abnormal results are displayed)  Labs Reviewed  BASIC METABOLIC PANEL - Abnormal; Notable for the following components:      Result Value   Glucose, Bld 104 (*)    All other components within normal limits  SURGICAL PCR SCREEN  CBC     IMAGES:   EKG: 01/28/18  Rate 69 bpm Normal sinus rhythm ST & T wave abnormality, consider anterolateral ischemia -more prominent since previous Abnormal ECG  CV: Stress Test on 02/13/2018, per Dr. Oliver Hum note low risk study  Past Medical History:  Diagnosis Date  . Anxiety   . Arthritis   . Complication of anesthesia   . Depression   . Hypertension   . PONV (postoperative nausea and vomiting)     Past Surgical History:  Procedure Laterality Date  . FOOT NEUROMA SURGERY     right foot  . KNEE SURGERY     right arthroscopic     MEDICATIONS: . acetaminophen (TYLENOL) 500 MG tablet  . amLODipine (NORVASC) 10 MG tablet  . buPROPion (WELLBUTRIN XL) 150 MG 24 hr tablet  . celecoxib (CELEBREX) 100 MG capsule  . escitalopram (LEXAPRO) 20 MG tablet  . LORazepam (ATIVAN) 1 MG tablet  . metoprolol tartrate (LOPRESSOR) 25 MG tablet  . traZODone (DESYREL) 50 MG tablet   No current facility-administered medications for this encounter.      Janey Genta [facility] [phone] 03/04/18 10:11 AM

## 2018-03-04 NOTE — Anesthesia Preprocedure Evaluation (Addendum)
Anesthesia Evaluation  Patient identified by MRN, date of birth, ID band Patient awake    Reviewed: Allergy & Precautions, H&P , NPO status , Patient's Chart, lab work & pertinent test results  History of Anesthesia Complications (+) PONV and Family history of anesthesia reaction  Airway Mallampati: III  TM Distance: >3 FB Neck ROM: Full    Dental no notable dental hx. (+) Teeth Intact, Dental Advisory Given   Pulmonary Current Smoker,    Pulmonary exam normal breath sounds clear to auscultation       Cardiovascular hypertension, Pt. on medications and Pt. on home beta blockers  Rhythm:Regular Rate:Normal     Neuro/Psych Anxiety Depression negative neurological ROS     GI/Hepatic negative GI ROS, Neg liver ROS,   Endo/Other  negative endocrine ROS  Renal/GU negative Renal ROS  negative genitourinary   Musculoskeletal  (+) Arthritis , Osteoarthritis,    Abdominal   Peds  Hematology negative hematology ROS (+)   Anesthesia Other Findings   Reproductive/Obstetrics negative OB ROS                           Anesthesia Physical Anesthesia Plan  ASA: II  Anesthesia Plan: Spinal and MAC   Post-op Pain Management:  Regional for Post-op pain   Induction: Intravenous  PONV Risk Score and Plan: 3 and Ondansetron, Dexamethasone, Propofol infusion and Midazolam  Airway Management Planned: Simple Face Mask  Additional Equipment:   Intra-op Plan:   Post-operative Plan:   Informed Consent: I have reviewed the patients History and Physical, chart, labs and discussed the procedure including the risks, benefits and alternatives for the proposed anesthesia with the patient or authorized representative who has indicated his/her understanding and acceptance.     Dental advisory given  Plan Discussed with: CRNA  Anesthesia Plan Comments: (See PST note on 03/01/2018, Konrad Felix, PA-C)        Anesthesia Quick Evaluation

## 2018-03-05 ENCOUNTER — Encounter (HOSPITAL_COMMUNITY): Payer: Self-pay | Admitting: *Deleted

## 2018-03-05 ENCOUNTER — Ambulatory Visit (HOSPITAL_COMMUNITY): Payer: 59 | Admitting: Certified Registered Nurse Anesthetist

## 2018-03-05 ENCOUNTER — Other Ambulatory Visit: Payer: Self-pay

## 2018-03-05 ENCOUNTER — Ambulatory Visit (HOSPITAL_COMMUNITY): Payer: 59 | Admitting: Physician Assistant

## 2018-03-05 ENCOUNTER — Observation Stay (HOSPITAL_COMMUNITY)
Admission: RE | Admit: 2018-03-05 | Discharge: 2018-03-06 | Disposition: A | Discharge status: Home / Self Care | Payer: 59 | Source: Ambulatory Visit | Attending: Orthopedic Surgery | Admitting: Orthopedic Surgery

## 2018-03-05 ENCOUNTER — Encounter (HOSPITAL_COMMUNITY)
Admission: RE | Disposition: A | Discharge status: Home / Self Care | Payer: Self-pay | Source: Ambulatory Visit | Attending: Orthopedic Surgery

## 2018-03-05 DIAGNOSIS — Z96652 Presence of left artificial knee joint: Secondary | ICD-10-CM

## 2018-03-05 DIAGNOSIS — Z885 Allergy status to narcotic agent status: Secondary | ICD-10-CM | POA: Insufficient documentation

## 2018-03-05 DIAGNOSIS — I10 Essential (primary) hypertension: Secondary | ICD-10-CM | POA: Diagnosis not present

## 2018-03-05 DIAGNOSIS — M1712 Unilateral primary osteoarthritis, left knee: Secondary | ICD-10-CM | POA: Diagnosis not present

## 2018-03-05 DIAGNOSIS — E663 Overweight: Secondary | ICD-10-CM | POA: Diagnosis present

## 2018-03-05 DIAGNOSIS — Z79899 Other long term (current) drug therapy: Secondary | ICD-10-CM | POA: Diagnosis not present

## 2018-03-05 DIAGNOSIS — Z6830 Body mass index (BMI) 30.0-30.9, adult: Secondary | ICD-10-CM | POA: Insufficient documentation

## 2018-03-05 DIAGNOSIS — F17210 Nicotine dependence, cigarettes, uncomplicated: Secondary | ICD-10-CM | POA: Insufficient documentation

## 2018-03-05 DIAGNOSIS — G8918 Other acute postprocedural pain: Secondary | ICD-10-CM | POA: Diagnosis not present

## 2018-03-05 DIAGNOSIS — F419 Anxiety disorder, unspecified: Secondary | ICD-10-CM | POA: Diagnosis not present

## 2018-03-05 DIAGNOSIS — Z96642 Presence of left artificial hip joint: Secondary | ICD-10-CM

## 2018-03-05 DIAGNOSIS — F329 Major depressive disorder, single episode, unspecified: Secondary | ICD-10-CM | POA: Diagnosis not present

## 2018-03-05 HISTORY — PX: TOTAL KNEE ARTHROPLASTY: SHX125

## 2018-03-05 LAB — TYPE AND SCREEN
ABO/RH(D): A POS
Antibody Screen: NEGATIVE

## 2018-03-05 SURGERY — ARTHROPLASTY, KNEE, TOTAL
Anesthesia: Monitor Anesthesia Care | Site: Knee | Laterality: Left

## 2018-03-05 MED ORDER — HYDROCODONE-ACETAMINOPHEN 7.5-325 MG PO TABS
1.0000 | ORAL_TABLET | ORAL | Status: DC | PRN
  Administered 2018-03-05 (×2): 2 via ORAL
  Filled 2018-03-05 (×2): qty 2

## 2018-03-05 MED ORDER — PROPOFOL 500 MG/50ML IV EMUL
INTRAVENOUS | Status: DC | PRN
  Administered 2018-03-05: 75 ug/kg/min via INTRAVENOUS

## 2018-03-05 MED ORDER — ACETAMINOPHEN 500 MG PO TABS
1000.0000 mg | ORAL_TABLET | Freq: Once | ORAL | Status: AC
  Administered 2018-03-05: 1000 mg via ORAL
  Filled 2018-03-05: qty 2

## 2018-03-05 MED ORDER — DEXAMETHASONE SODIUM PHOSPHATE 10 MG/ML IJ SOLN
10.0000 mg | Freq: Once | INTRAMUSCULAR | Status: AC
  Administered 2018-03-05: 10 mg via INTRAVENOUS

## 2018-03-05 MED ORDER — BUPROPION HCL ER (XL) 150 MG PO TB24
150.0000 mg | ORAL_TABLET | Freq: Every day | ORAL | Status: DC
  Administered 2018-03-06: 150 mg via ORAL
  Filled 2018-03-05: qty 1

## 2018-03-05 MED ORDER — BUPIVACAINE-EPINEPHRINE (PF) 0.25% -1:200000 IJ SOLN
INTRAMUSCULAR | Status: DC | PRN
  Administered 2018-03-05: 30 mL

## 2018-03-05 MED ORDER — BUPIVACAINE IN DEXTROSE 0.75-8.25 % IT SOLN
INTRATHECAL | Status: DC | PRN
  Administered 2018-03-05: 1.6 mL via INTRATHECAL

## 2018-03-05 MED ORDER — FENTANYL CITRATE (PF) 100 MCG/2ML IJ SOLN
100.0000 ug | Freq: Once | INTRAMUSCULAR | Status: AC
  Administered 2018-03-05: 50 ug via INTRAVENOUS
  Filled 2018-03-05: qty 2

## 2018-03-05 MED ORDER — STERILE WATER FOR IRRIGATION IR SOLN
Status: DC | PRN
  Administered 2018-03-05: 2000 mL

## 2018-03-05 MED ORDER — MENTHOL 3 MG MT LOZG: 1.0000 | LOZENGE | OROMUCOSAL | Status: DC | PRN

## 2018-03-05 MED ORDER — METOPROLOL TARTRATE 25 MG PO TABS
25.0000 mg | ORAL_TABLET | Freq: Two times a day (BID) | ORAL | Status: DC
  Administered 2018-03-05 – 2018-03-06 (×2): 25 mg via ORAL
  Filled 2018-03-05 (×2): qty 1

## 2018-03-05 MED ORDER — EPHEDRINE SULFATE-NACL 50-0.9 MG/10ML-% IV SOSY
PREFILLED_SYRINGE | INTRAVENOUS | Status: DC | PRN
  Administered 2018-03-05 (×4): 5 mg via INTRAVENOUS
  Administered 2018-03-05: 10 mg via INTRAVENOUS
  Administered 2018-03-05: 5 mg via INTRAVENOUS

## 2018-03-05 MED ORDER — TRAZODONE HCL 50 MG PO TABS
50.0000 mg | ORAL_TABLET | Freq: Every evening | ORAL | Status: DC | PRN
  Administered 2018-03-05: 50 mg via ORAL
  Filled 2018-03-05: qty 1

## 2018-03-05 MED ORDER — HYDROMORPHONE HCL 1 MG/ML IJ SOLN: 0.5000 mg | INTRAMUSCULAR | Status: DC | PRN

## 2018-03-05 MED ORDER — LACTATED RINGERS IV SOLN
INTRAVENOUS | Status: DC
  Administered 2018-03-05 (×2): via INTRAVENOUS

## 2018-03-05 MED ORDER — METOCLOPRAMIDE HCL 5 MG/ML IJ SOLN: 5.0000 mg | Freq: Three times a day (TID) | INTRAMUSCULAR | Status: DC | PRN

## 2018-03-05 MED ORDER — PHENOL 1.4 % MT LIQD: 1.0000 | OROMUCOSAL | Status: DC | PRN

## 2018-03-05 MED ORDER — 0.9 % SODIUM CHLORIDE (POUR BTL) OPTIME
TOPICAL | Status: DC | PRN
  Administered 2018-03-05: 1000 mL

## 2018-03-05 MED ORDER — METHOCARBAMOL 500 MG IVPB - SIMPLE MED
INTRAVENOUS | Status: AC
  Administered 2018-03-05: 500 mg via INTRAVENOUS
  Filled 2018-03-05: qty 50

## 2018-03-05 MED ORDER — PHENYLEPHRINE 40 MCG/ML (10ML) SYRINGE FOR IV PUSH (FOR BLOOD PRESSURE SUPPORT)
PREFILLED_SYRINGE | INTRAVENOUS | Status: DC | PRN
  Administered 2018-03-05 (×8): 40 ug via INTRAVENOUS

## 2018-03-05 MED ORDER — MAGNESIUM CITRATE PO SOLN: 1.0000 | Freq: Once | ORAL | Status: DC | PRN

## 2018-03-05 MED ORDER — BUPIVACAINE-EPINEPHRINE (PF) 0.5% -1:200000 IJ SOLN
INTRAMUSCULAR | Status: DC | PRN
  Administered 2018-03-05: 20 mL via PERINEURAL

## 2018-03-05 MED ORDER — POLYETHYLENE GLYCOL 3350 17 G PO PACK
17.0000 g | PACK | Freq: Two times a day (BID) | ORAL | Status: DC
  Administered 2018-03-06: 17 g via ORAL
  Filled 2018-03-05 (×2): qty 1

## 2018-03-05 MED ORDER — CHLORHEXIDINE GLUCONATE 4 % EX LIQD: 60.0000 mL | Freq: Once | CUTANEOUS | Status: DC

## 2018-03-05 MED ORDER — ASPIRIN 81 MG PO CHEW
81.0000 mg | CHEWABLE_TABLET | Freq: Two times a day (BID) | ORAL | Status: DC
  Administered 2018-03-05 – 2018-03-06 (×2): 81 mg via ORAL
  Filled 2018-03-05 (×2): qty 1

## 2018-03-05 MED ORDER — SODIUM CHLORIDE (PF) 0.9 % IJ SOLN
INTRAMUSCULAR | Status: AC
  Filled 2018-03-05: qty 50

## 2018-03-05 MED ORDER — SODIUM CHLORIDE 0.9 % IV SOLN
INTRAVENOUS | Status: DC
  Administered 2018-03-05: 17:00:00 via INTRAVENOUS

## 2018-03-05 MED ORDER — MIDAZOLAM HCL 2 MG/2ML IJ SOLN
2.0000 mg | Freq: Once | INTRAMUSCULAR | Status: AC
  Administered 2018-03-05: 1 mg via INTRAVENOUS
  Filled 2018-03-05: qty 2

## 2018-03-05 MED ORDER — TRANEXAMIC ACID-NACL 1000-0.7 MG/100ML-% IV SOLN
1000.0000 mg | INTRAVENOUS | Status: AC
  Administered 2018-03-05: 1000 mg via INTRAVENOUS
  Filled 2018-03-05: qty 100

## 2018-03-05 MED ORDER — SODIUM CHLORIDE 0.9 % IR SOLN
Status: DC | PRN
  Administered 2018-03-05: 1000 mL

## 2018-03-05 MED ORDER — CEFAZOLIN SODIUM-DEXTROSE 2-4 GM/100ML-% IV SOLN
2.0000 g | INTRAVENOUS | Status: AC
  Administered 2018-03-05: 2 g via INTRAVENOUS
  Filled 2018-03-05: qty 100

## 2018-03-05 MED ORDER — BISACODYL 10 MG RE SUPP: 10.0000 mg | Freq: Every day | RECTAL | Status: DC | PRN

## 2018-03-05 MED ORDER — HYDROMORPHONE HCL 1 MG/ML IJ SOLN: 0.2500 mg | INTRAMUSCULAR | Status: DC | PRN

## 2018-03-05 MED ORDER — ACETAMINOPHEN 325 MG PO TABS: 325.0000 mg | ORAL_TABLET | Freq: Four times a day (QID) | ORAL | Status: DC | PRN

## 2018-03-05 MED ORDER — CEFAZOLIN SODIUM-DEXTROSE 2-4 GM/100ML-% IV SOLN
2.0000 g | Freq: Four times a day (QID) | INTRAVENOUS | Status: AC
  Administered 2018-03-05 – 2018-03-06 (×2): 2 g via INTRAVENOUS
  Filled 2018-03-05 (×2): qty 100

## 2018-03-05 MED ORDER — TRANEXAMIC ACID-NACL 1000-0.7 MG/100ML-% IV SOLN
1000.0000 mg | Freq: Once | INTRAVENOUS | Status: AC
  Administered 2018-03-05: 1000 mg via INTRAVENOUS
  Filled 2018-03-05: qty 100

## 2018-03-05 MED ORDER — ONDANSETRON HCL 4 MG PO TABS: 4.0000 mg | ORAL_TABLET | Freq: Four times a day (QID) | ORAL | Status: DC | PRN

## 2018-03-05 MED ORDER — ONDANSETRON HCL 4 MG/2ML IJ SOLN
INTRAMUSCULAR | Status: DC | PRN
  Administered 2018-03-05: 4 mg via INTRAVENOUS

## 2018-03-05 MED ORDER — DEXAMETHASONE SODIUM PHOSPHATE 10 MG/ML IJ SOLN
10.0000 mg | Freq: Once | INTRAMUSCULAR | Status: AC
  Administered 2018-03-06: 10 mg via INTRAVENOUS
  Filled 2018-03-05: qty 1

## 2018-03-05 MED ORDER — LORAZEPAM 1 MG PO TABS: 1.0000 mg | ORAL_TABLET | Freq: Every day | ORAL | Status: DC | PRN

## 2018-03-05 MED ORDER — SODIUM CHLORIDE (PF) 0.9 % IJ SOLN
INTRAMUSCULAR | Status: DC | PRN
  Administered 2018-03-05: 30 mL

## 2018-03-05 MED ORDER — PROPOFOL 10 MG/ML IV BOLUS
INTRAVENOUS | Status: AC
  Filled 2018-03-05: qty 40

## 2018-03-05 MED ORDER — DOCUSATE SODIUM 100 MG PO CAPS
100.0000 mg | ORAL_CAPSULE | Freq: Two times a day (BID) | ORAL | Status: DC
  Administered 2018-03-05 – 2018-03-06 (×2): 100 mg via ORAL
  Filled 2018-03-05 (×2): qty 1

## 2018-03-05 MED ORDER — DEXAMETHASONE SODIUM PHOSPHATE 10 MG/ML IJ SOLN
INTRAMUSCULAR | Status: AC
  Filled 2018-03-05: qty 1

## 2018-03-05 MED ORDER — KETOROLAC TROMETHAMINE 30 MG/ML IJ SOLN
INTRAMUSCULAR | Status: AC
  Filled 2018-03-05: qty 1

## 2018-03-05 MED ORDER — PHENYLEPHRINE 40 MCG/ML (10ML) SYRINGE FOR IV PUSH (FOR BLOOD PRESSURE SUPPORT)
PREFILLED_SYRINGE | INTRAVENOUS | Status: AC
  Filled 2018-03-05: qty 10

## 2018-03-05 MED ORDER — HYDROCODONE-ACETAMINOPHEN 5-325 MG PO TABS
1.0000 | ORAL_TABLET | ORAL | Status: DC | PRN
  Administered 2018-03-06 (×3): 2 via ORAL
  Filled 2018-03-05 (×3): qty 2

## 2018-03-05 MED ORDER — BUPIVACAINE-EPINEPHRINE (PF) 0.25% -1:200000 IJ SOLN
INTRAMUSCULAR | Status: AC
  Filled 2018-03-05: qty 30

## 2018-03-05 MED ORDER — METOCLOPRAMIDE HCL 5 MG PO TABS: 5.0000 mg | ORAL_TABLET | Freq: Three times a day (TID) | ORAL | Status: DC | PRN

## 2018-03-05 MED ORDER — CELECOXIB 200 MG PO CAPS
200.0000 mg | ORAL_CAPSULE | Freq: Two times a day (BID) | ORAL | Status: DC
  Administered 2018-03-05 – 2018-03-06 (×2): 200 mg via ORAL
  Filled 2018-03-05 (×2): qty 1

## 2018-03-05 MED ORDER — AMLODIPINE BESYLATE 10 MG PO TABS
10.0000 mg | ORAL_TABLET | Freq: Every day | ORAL | Status: DC
  Administered 2018-03-06: 10 mg via ORAL
  Filled 2018-03-05: qty 1

## 2018-03-05 MED ORDER — PROPOFOL 10 MG/ML IV BOLUS
INTRAVENOUS | Status: AC
  Filled 2018-03-05: qty 20

## 2018-03-05 MED ORDER — DIPHENHYDRAMINE HCL 12.5 MG/5ML PO ELIX: 12.5000 mg | ORAL_SOLUTION | ORAL | Status: DC | PRN

## 2018-03-05 MED ORDER — ONDANSETRON HCL 4 MG/2ML IJ SOLN
INTRAMUSCULAR | Status: AC
  Filled 2018-03-05: qty 2

## 2018-03-05 MED ORDER — EPHEDRINE 5 MG/ML INJ
INTRAVENOUS | Status: AC
  Filled 2018-03-05: qty 10

## 2018-03-05 MED ORDER — ALUM & MAG HYDROXIDE-SIMETH 200-200-20 MG/5ML PO SUSP: 15.0000 mL | ORAL | Status: DC | PRN

## 2018-03-05 MED ORDER — METHOCARBAMOL 500 MG IVPB - SIMPLE MED
500.0000 mg | Freq: Four times a day (QID) | INTRAVENOUS | Status: DC | PRN
  Administered 2018-03-05: 500 mg via INTRAVENOUS
  Filled 2018-03-05: qty 50

## 2018-03-05 MED ORDER — METHOCARBAMOL 500 MG PO TABS
500.0000 mg | ORAL_TABLET | Freq: Four times a day (QID) | ORAL | Status: DC | PRN
  Administered 2018-03-05 – 2018-03-06 (×2): 500 mg via ORAL
  Filled 2018-03-05 (×2): qty 1

## 2018-03-05 MED ORDER — ESCITALOPRAM OXALATE 20 MG PO TABS
20.0000 mg | ORAL_TABLET | Freq: Every day | ORAL | Status: DC
  Administered 2018-03-05: 20 mg via ORAL
  Filled 2018-03-05: qty 1

## 2018-03-05 MED ORDER — FERROUS SULFATE 325 (65 FE) MG PO TABS
325.0000 mg | ORAL_TABLET | Freq: Two times a day (BID) | ORAL | Status: DC
  Administered 2018-03-06: 325 mg via ORAL
  Filled 2018-03-05: qty 1

## 2018-03-05 MED ORDER — KETOROLAC TROMETHAMINE 30 MG/ML IJ SOLN
INTRAMUSCULAR | Status: DC | PRN
  Administered 2018-03-05: 30 mg

## 2018-03-05 MED ORDER — ONDANSETRON HCL 4 MG/2ML IJ SOLN: 4.0000 mg | Freq: Four times a day (QID) | INTRAMUSCULAR | Status: DC | PRN

## 2018-03-05 SURGICAL SUPPLY — 64 items
ADH SKN CLS APL DERMABOND .7 (GAUZE/BANDAGES/DRESSINGS) ×1
ATTUNE MED ANAT PAT 35 KNEE (Knees) ×1 IMPLANT
ATTUNE PSFEM LTSZ3 NARCEM KNEE (Femur) ×1 IMPLANT
ATTUNE PSRP INSR SZ3 5 KNEE (Insert) ×1 IMPLANT
BAG SPEC THK2 15X12 ZIP CLS (MISCELLANEOUS)
BAG ZIPLOCK 12X15 (MISCELLANEOUS) IMPLANT
BANDAGE ACE 6X5 VEL STRL LF (GAUZE/BANDAGES/DRESSINGS) ×2 IMPLANT
BASE TIBIAL ROT PLAT SZ 3 KNEE (Knees) IMPLANT
BLADE SAW SGTL 11.0X1.19X90.0M (BLADE) ×1 IMPLANT
BLADE SAW SGTL 13.0X1.19X90.0M (BLADE) ×2 IMPLANT
BLADE SURG SZ10 CARB STEEL (BLADE) ×4 IMPLANT
BOWL SMART MIX CTS (DISPOSABLE) ×2 IMPLANT
BSPLAT TIB 3 CMNT ROT PLAT STR (Knees) ×1 IMPLANT
CEMENT HV SMART SET (Cement) ×2 IMPLANT
COVER SURGICAL LIGHT HANDLE (MISCELLANEOUS) ×2 IMPLANT
COVER WAND RF STERILE (DRAPES) ×1 IMPLANT
CUFF TOURN SGL QUICK 34 (TOURNIQUET CUFF) ×2
CUFF TRNQT CYL 34X4X40X1 (TOURNIQUET CUFF) ×1 IMPLANT
DECANTER SPIKE VIAL GLASS SM (MISCELLANEOUS) ×3 IMPLANT
DERMABOND ADVANCED (GAUZE/BANDAGES/DRESSINGS) ×1
DERMABOND ADVANCED .7 DNX12 (GAUZE/BANDAGES/DRESSINGS) ×1 IMPLANT
DRAPE U-SHAPE 47X51 STRL (DRAPES) ×2 IMPLANT
DRESSING AQUACEL AG SP 3.5X10 (GAUZE/BANDAGES/DRESSINGS) ×1 IMPLANT
DRSG AQUACEL AG SP 3.5X10 (GAUZE/BANDAGES/DRESSINGS) ×2
DURAPREP 26ML APPLICATOR (WOUND CARE) ×4 IMPLANT
ELECT REM PT RETURN 15FT ADLT (MISCELLANEOUS) ×2 IMPLANT
GLOVE BIO SURGEON STRL SZ 6.5 (GLOVE) ×1 IMPLANT
GLOVE BIOGEL PI IND STRL 6.5 (GLOVE) IMPLANT
GLOVE BIOGEL PI IND STRL 7.0 (GLOVE) IMPLANT
GLOVE BIOGEL PI IND STRL 7.5 (GLOVE) ×1 IMPLANT
GLOVE BIOGEL PI IND STRL 8.5 (GLOVE) ×1 IMPLANT
GLOVE BIOGEL PI INDICATOR 6.5 (GLOVE) ×1
GLOVE BIOGEL PI INDICATOR 7.0 (GLOVE) ×1
GLOVE BIOGEL PI INDICATOR 7.5 (GLOVE) ×4
GLOVE BIOGEL PI INDICATOR 8.5 (GLOVE) ×1
GLOVE ECLIPSE 8.0 STRL XLNG CF (GLOVE) ×1 IMPLANT
GLOVE ORTHO TXT STRL SZ7.5 (GLOVE) ×2 IMPLANT
GLOVE SURG SS PI 7.0 STRL IVOR (GLOVE) ×1 IMPLANT
GOWN SPEC L4 XLG W/TWL (GOWN DISPOSABLE) ×2 IMPLANT
GOWN STRL REUS W/TWL 2XL LVL3 (GOWN DISPOSABLE) ×1 IMPLANT
GOWN STRL REUS W/TWL LRG LVL3 (GOWN DISPOSABLE) ×3 IMPLANT
HANDPIECE INTERPULSE COAX TIP (DISPOSABLE) ×2
HOLDER FOLEY CATH W/STRAP (MISCELLANEOUS) ×1 IMPLANT
MANIFOLD NEPTUNE II (INSTRUMENTS) ×2 IMPLANT
NDL SAFETY ECLIPSE 18X1.5 (NEEDLE) IMPLANT
NEEDLE HYPO 18GX1.5 SHARP (NEEDLE) ×2
NS IRRIG 1000ML POUR BTL (IV SOLUTION) ×2 IMPLANT
PACK TOTAL KNEE CUSTOM (KITS) ×2 IMPLANT
PIN FIX SIGMA HP QUICK REL (PIN) ×1 IMPLANT
PROTECTOR NERVE ULNAR (MISCELLANEOUS) ×2 IMPLANT
SET HNDPC FAN SPRY TIP SCT (DISPOSABLE) ×1 IMPLANT
SET PAD KNEE POSITIONER (MISCELLANEOUS) ×2 IMPLANT
SUT MNCRL AB 4-0 PS2 18 (SUTURE) ×2 IMPLANT
SUT STRATAFIX PDS+ 0 24IN (SUTURE) ×2 IMPLANT
SUT VIC AB 1 CT1 36 (SUTURE) ×2 IMPLANT
SUT VIC AB 2-0 CT1 27 (SUTURE) ×6
SUT VIC AB 2-0 CT1 TAPERPNT 27 (SUTURE) ×3 IMPLANT
SYR 3ML LL SCALE MARK (SYRINGE) ×2 IMPLANT
TIBIAL BASE ROT PLAT SZ 3 KNEE (Knees) ×2 IMPLANT
TRAY FOLEY CATH 14FRSI W/METER (CATHETERS) ×1 IMPLANT
TRAY FOLEY MTR SLVR 16FR STAT (SET/KITS/TRAYS/PACK) ×1 IMPLANT
WATER STERILE IRR 1000ML POUR (IV SOLUTION) ×4 IMPLANT
WRAP KNEE MAXI GEL POST OP (GAUZE/BANDAGES/DRESSINGS) ×2 IMPLANT
YANKAUER SUCT BULB TIP 10FT TU (MISCELLANEOUS) ×2 IMPLANT

## 2018-03-05 NOTE — Interval H&P Note (Signed)
History and Physical Interval Note:  03/05/2018 11:07 AM  [name]  has presented today for surgery, with the diagnosis of left knee osteoarthritis  The various methods of treatment have been discussed with the patient and family. After consideration of risks, benefits and other options for treatment, the patient has consented to  Procedure(s) with comments: TOTAL KNEE ARTHROPLASTY (Left) - 70 minutes as a surgical intervention .  The patient's history has been reviewed, patient examined, no change in status, stable for surgery.  I have reviewed the patient's chart and labs.  Questions were answered to the patient's satisfaction.     [name]

## 2018-03-05 NOTE — Evaluation (Signed)
Physical Therapy Evaluation Patient Details Name: [name] MRN: [id number] DOB: 05-12-1955 Today's Date: 03/05/2018   History of Present Illness  63 y.o. female admitted on 03/05/18 for elective L TKA.  Pt with significant PMH of R knee arthroscopy, HTN, and R foot neruoma surgery.   Clinical Impression  Pt is POD #0 and is limited to OOB to chair only this PM due to lightheadedness in sitting and standing.  She was able to complete the first page of her HEP and I anticipate for her to progress quickly as she feels better.   PT to follow acutely for deficits listed below.      Follow Up Recommendations Follow surgeon's recommendation for DC plan and follow-up therapies    Equipment Recommendations  None recommended by PT    Recommendations for Other Services   NA    Precautions / Restrictions Precautions Precautions: Knee Precaution Booklet Issued: Yes (comment) Precaution Comments: knee exercise handout given and precaution reviewed Restrictions Weight Bearing Restrictions: No LLE Weight Bearing: Weight bearing as tolerated      Mobility  Bed Mobility Overal bed mobility: Needs Assistance Bed Mobility: Supine to Sit     Supine to sit: Min assist;HOB elevated     General bed mobility comments: Min assist to help progress left leg to EOB.  Pt using bed rail for leverage at her trunk.   Transfers Overall transfer level: Needs assistance   Transfers: Sit to/from Stand;Stand Pivot Transfers Sit to Stand: Min assist Stand pivot transfers: Min assist       General transfer comment: Min assis to stand and take 6-7 pivotal steps with RW to the recliner chair.  Limited by lightheadedness to transfer.   Ambulation/Gait             General Gait Details: deferred due to lightheadedness EOB and in standing.          Balance Overall balance assessment: Needs assistance Sitting-balance support: Feet supported;No upper extremity supported Sitting  balance-Leahy Scale: Good     Standing balance support: Bilateral upper extremity supported Standing balance-Leahy Scale: Poor Standing balance comment: need external support from RW and PT                             Pertinent Vitals/Pain Pain Assessment: Faces Pain Score: 8  Pain Location: left knee/thigh Pain Descriptors / Indicators: Aching;Burning Pain Intervention(s): Limited activity within patient's tolerance;Monitored during session;Repositioned    Home Living Family/patient expects to be discharged to:: Private residence Living Arrangements: Other relatives Available Help at Discharge: Family Type of Home: House Home Access: Stairs to enter Entrance Stairs-Rails: Doctor, general practice of Steps: 4 Home Layout: One level Home Equipment: Environmental consultant - 2 wheels(tall husband's walker)      Prior Function Level of Independence: Independent         Comments: works full time for [employer]   Dominant Hand: Right    Extremity/Trunk Assessment   Upper Extremity Assessment Upper Extremity Assessment: Overall WFL for tasks assessed    Lower Extremity Assessment Lower Extremity Assessment: LLE deficits/detail LLE Deficits / Details: left leg with normal post op pain and weakness, ankle at least 3/5, knee 3-/5 (ext) hip flexion 3-/5    Cervical / Trunk Assessment Cervical / Trunk Assessment: Normal  Communication   Communication: No difficulties  Cognition Arousal/Alertness: Awake/alert Behavior During Therapy: WFL for tasks assessed/performed Overall Cognitive Status: Within Functional Limits  for tasks assessed                                           Exercises Total Joint Exercises Ankle Circles/Pumps: AROM;Both;20 reps Quad Sets: AROM;Left;10 reps Towel Squeeze: AROM;Both;10 reps Heel Slides: AAROM;Left;10 reps   Assessment/Plan    PT Assessment Patient needs continued PT services  PT Problem  List Decreased strength;Decreased range of motion;Decreased activity tolerance;Decreased balance;Decreased mobility;Decreased knowledge of use of DME;Decreased knowledge of precautions;Pain       PT Treatment Interventions DME instruction;Gait training;Stair training;Functional mobility training;Therapeutic activities;Therapeutic exercise;Balance training;Patient/family education;Manual techniques;Modalities    PT Goals (Current goals can be found in the Care Plan section)  Acute Rehab PT Goals Patient Stated Goal: to go home tomorrow if she can and have the other knee done in 5-6 weeks PT Goal Formulation: With patient Time For Goal Achievement: 03/12/18 Potential to Achieve Goals: Good    Frequency 7X/week          AM-PAC PT "6 Clicks" Mobility  Outcome Measure Help needed turning from your back to your side while in a flat bed without using bedrails?: A Little Help needed moving from lying on your back to sitting on the side of a flat bed without using bedrails?: A Little Help needed moving to and from a bed to a chair (including a wheelchair)?: A Little Help needed standing up from a chair using your arms (e.g., wheelchair or bedside chair)?: A Little Help needed to walk in hospital room?: A Little Help needed climbing 3-5 steps with a railing? : A Little 6 Click Score: 18    End of Session Equipment Utilized During Treatment: Gait belt Activity Tolerance: Patient limited by pain;Other (comment)(limited by lightheadedness) Patient left: in chair;with call bell/phone within reach;with chair alarm set   PT Visit Diagnosis: Muscle weakness (generalized) (M62.81);Difficulty in walking, not elsewhere classified (R26.2);Pain Pain - Right/Left: Left Pain - part of body: Knee    Time: [id number] PT Time Calculation (min) (ACUTE ONLY): 35 min   Charges:         Lurena Joinerebecca B. Danayah Smyre, PT, DPT  Acute Rehabilitation [phone] pager #[phone] office   PT  Evaluation $PT Eval Moderate Complexity: 1 Mod PT Treatments $Gait Training: 8-22 mins       03/05/2018, 6:13 PM

## 2018-03-05 NOTE — Progress Notes (Signed)
Assisted Dr. Edmond Fitzgerald with left, ultrasound guided, adductor canal block. Side rails up, monitors on throughout procedure. See vital signs in flow sheet. Tolerated Procedure well. 

## 2018-03-05 NOTE — Transfer of Care (Signed)
Immediate Anesthesia Transfer of Care Note  Patient: [name]  Procedure(s) Performed: TOTAL KNEE ARTHROPLASTY (Left Knee)  Patient Location: PACU  Anesthesia Type:Regional and Spinal  Level of Consciousness: awake, alert  and oriented  Airway & Oxygen Therapy: Patient Spontanous Breathing and Patient connected to face mask oxygen  Post-op Assessment: Report given to RN and Post -op Vital signs reviewed and stable  Post vital signs: Reviewed and stable  Last Vitals:  Vitals Value Taken Time  BP 115/76 03/05/2018  2:21 PM  Temp    Pulse 73 03/05/2018  2:22 PM  Resp 19 03/05/2018  2:22 PM  SpO2 100 % 03/05/2018  2:22 PM  Vitals shown include unvalidated device data.  Last Pain:  Vitals:   03/05/18 1123  TempSrc:   PainSc: 7       Patients Stated Pain Goal: 4 (03/05/18 1123)  Complications: No apparent anesthesia complications

## 2018-03-05 NOTE — Discharge Instructions (Signed)

## 2018-03-05 NOTE — Anesthesia Postprocedure Evaluation (Signed)
Anesthesia Post Note  Patient: [name]  Procedure(s) Performed: TOTAL KNEE ARTHROPLASTY (Left Knee)     Patient location during evaluation: PACU Anesthesia Type: MAC, Spinal and Regional Level of consciousness: oriented and awake and alert Pain management: pain level controlled Vital Signs Assessment: post-procedure vital signs reviewed and stable Respiratory status: spontaneous breathing, respiratory function stable and patient connected to nasal cannula oxygen Cardiovascular status: blood pressure returned to baseline and stable Postop Assessment: no headache, no backache, no apparent nausea or vomiting, spinal receding and patient able to bend at knees Anesthetic complications: no    Last Vitals:  Vitals:   03/05/18 1445 03/05/18 1500  BP: 127/75 112/69  Pulse: 70 67  Resp: 15 19  Temp:  36.5 C  SpO2: 100% 98%    Last Pain:  Vitals:   03/05/18 1500  TempSrc:   PainSc: 0-No pain    LLE Motor Response: Purposeful movement (03/05/18 1500)   RLE Motor Response: Purposeful movement (03/05/18 1500)   L Sensory Level: S1-Sole of foot, small toes (03/05/18 1500) R Sensory Level: S1-Sole of foot, small toes (03/05/18 1500)  Shaivi Rothschild,W. EDMOND

## 2018-03-05 NOTE — Anesthesia Procedure Notes (Signed)
Anesthesia Regional Block: Adductor canal block   Pre-Anesthetic Checklist: ,, timeout performed, Correct Patient, Correct Site, Correct Laterality, Correct Procedure, Correct Position, site marked, Risks and benefits discussed, pre-op evaluation,  At surgeon's request and post-op pain management  Laterality: Left  Prep: Maximum Sterile Barrier Precautions used, chloraprep       Needles:  Injection technique: Single-shot  Needle Type: Echogenic Stimulator Needle     Needle Length: 9cm  Needle Gauge: 21     Additional Needles:   Procedures:,,,, ultrasound used (permanent image in chart),,,,  Narrative:  Start time: 03/05/2018 11:12 AM End time: 03/05/2018 11:22 AM Injection made incrementally with aspirations every 5 mL.  Performed by: Personally  Anesthesiologist: Gaynelle Adu, MD  Additional Notes: 2% Lidocaine skin wheel.

## 2018-03-05 NOTE — Op Note (Signed)
NAME:  [name]                      MEDICAL RECORD NO.:  [id number]                             FACILITY:  [facility]      PHYSICIAN:  Madlyn Frankel. Charlann Boxer, M.D.  DATE OF BIRTH:  06-16-1955      DATE OF PROCEDURE:  03/05/2018                                     OPERATIVE REPORT         PREOPERATIVE DIAGNOSIS:  Left knee osteoarthritis.      POSTOPERATIVE DIAGNOSIS:  Left knee osteoarthritis.      FINDINGS:  The patient was noted to have complete loss of cartilage and   bone-on-bone arthritis with associated osteophytes in the medial and patellofemoral compartments of   the knee with a significant synovitis and associated effusion.  The patient had failed months of conservative treatment including medications, injection therapy, activity modification.     PROCEDURE:  Left total knee replacement.      COMPONENTS USED:  DePuy Attune rotating platform posterior stabilized knee   system, a size 3N femur, 3 tibia, size 5 mm PS AOX insert, and 32 anatomic patellar   button.      SURGEON:  Madlyn Frankel. Charlann Boxer, M.D.      ASSISTANT:  Dennie Bible, PA-C.      ANESTHESIA:  Spinal.      SPECIMENS:  None.      COMPLICATION:  None.      DRAINS:  None.  EBL: <100 cc      TOURNIQUET TIME:   Total Tourniquet Time Documented: Thigh (Left) - 30 minutes Total: Thigh (Left) - 30 minutes  .      The patient was stable to the recovery room.      INDICATION FOR PROCEDURE:  [name] is a 63 y.o. female patient of   mine.  The patient had been seen, evaluated, and treated for months conservatively in the   office with medication, activity modification, and injections.  The patient had   radiographic changes of bone-on-bone arthritis with endplate sclerosis and osteophytes noted.  Based on the radiographic changes and failed conservative measures, the patient   decided to proceed with definitive treatment, total knee replacement.  Risks of infection, DVT, component failure, need for  revision surgery, neurovascular injury were reviewed in the office setting.  The postop course was reviewed stressing the efforts to maximize post-operative satisfaction and function.  Consent was obtained for benefit of pain   relief.      PROCEDURE IN DETAIL:  The patient was brought to the operative theater.   Once adequate anesthesia, preoperative antibiotics, 2 gm of Ancef,1 gm of Tranexamic Acid, and 10 mg of Decadron administered, the patient was positioned supine with a left thigh tourniquet placed.  The  left lower extremity was prepped and draped in sterile fashion.  A time-   out was performed identifying the patient, planned procedure, and the appropriate extremity.      The left lower extremity was placed in the [facility] leg holder.  The leg was   exsanguinated, tourniquet elevated to 250 mmHg.  A midline incision was   made followed  by median parapatellar arthrotomy.  Following initial   exposure, attention was first directed to the patella.  Precut   measurement was noted to be 22 mm.  I resected down to 14 mm and used a   32 anatomic patellar button to restore patellar height as well as cover the cut surface.      The lug holes were drilled and a metal shim was placed to protect the   patella from retractors and saw blade during the procedure.      At this point, attention was now directed to the femur.  The femoral   canal was opened with a drill, irrigated to try to prevent fat emboli.  An   intramedullary rod was passed at 3 degrees valgus, 10 mm of bone was   resected off the distal femur.  Following this resection, the tibia was   subluxated anteriorly.  Using the extramedullary guide, 2 mm of bone was resected off   the proximal medial tibia.  We confirmed the gap would be   stable medially and laterally with a size 5 spacer block as well as confirmed that the tibial cut was perpendicular in the coronal plane, checking with an alignment rod.      Once this was done, I  sized the femur to be a size 3 in the anterior-   posterior dimension, chose a narrow component based on medial and   lateral dimension.  The size 3 rotation block was then pinned in   position anterior referenced using the C-clamp to set rotation.  The   anterior, posterior, and  chamfer cuts were made without difficulty nor   notching making certain that I was along the anterior cortex to help   with flexion gap stability.      The final box cut was made off the lateral aspect of distal femur.      At this point, the tibia was sized to be a size 3.  The size 3 tray was   then pinned in position through the medial third of the tubercle,   drilled, and keel punched.  Trial reduction was now carried with a 3 femur,  3 tibia, a size 5 mm PS insert, and the 32 anatomic patella botton.  The knee was brought to full extension with good flexion stability with the patella   tracking through the trochlea without application of pressure.  Given   all these findings the trial components removed.  Final components were   opened and cement was mixed.  The knee was irrigated with normal saline solution and pulse lavage.  The synovial lining was   then injected with 30 cc of 0.25% Marcaine with epinephrine, 1 cc of Toradol and 30 cc of NS for a total of 61 cc.     Final implants were then cemented onto cleaned and dried cut surfaces of bone with the knee brought to extension with a size 5 mm PS trial insert.      Once the cement had fully cured, excess cement was removed   throughout the knee.  I confirmed that I was satisfied with the range of   motion and stability, and the final size 5 mm PS AOX insert was chosen.  It was   placed into the knee.      The tourniquet had been let down at 30 minutes.  No significant   hemostasis was required.  The extensor mechanism was then reapproximated using #1 Vicryl and #1  Stratafix sutures with the knee   in flexion.  The   remaining wound was closed with 2-0  Vicryl and running 4-0 Monocryl.   The knee was cleaned, dried, dressed sterilely using Dermabond and   Aquacel dressing.  The patient was then   brought to recovery room in stable condition, tolerating the procedure   well.   Please note that Physician Assistant, Dennie Bible, PA-C was present for the entirety of the case, and was utilized for pre-operative positioning, peri-operative retractor management, general facilitation of the procedure and for primary wound closure at the end of the case.              Madlyn Frankel Charlann Boxer, M.D.    03/05/2018 1:43 PM

## 2018-03-05 NOTE — Anesthesia Procedure Notes (Signed)
Spinal  End time: 03/05/2018 12:30 PM Staffing Anesthesiologist: Gaynelle AduFitzgerald, William, MD Resident/CRNA: Orest DikesPeters, Kristee Angus J, CRNA Performed: resident/CRNA  Preanesthetic Checklist Completed: patient identified, site marked, surgical consent, pre-op evaluation, timeout performed, IV checked, risks and benefits discussed and monitors and equipment checked Spinal Block Patient position: sitting Prep: DuraPrep Patient monitoring: heart rate, continuous pulse ox and blood pressure Approach: midline Location: L3-4 Injection technique: single-shot Needle Needle type: Pencan  Needle gauge: 24 G Needle length: 10 cm Additional Notes Patient sitting for SAB placement. Single shot, negative heme/paresthesia. Dr Sampson GoonFitzgerald at bedside during entire placement. Expiration date 12-07-2018, LOT # [id number].

## 2018-03-05 NOTE — H&P (Signed)
TOTAL KNEE ADMISSION H&P  Patient is being admitted for left total knee arthroplasty.  Subjective:  Chief Complaint:  Left knee primary OA / pain  HPI: [name], 63 y.o. female, has a history of pain and functional disability in the left knee due to arthritis and has failed non-surgical conservative treatments for greater than 12 weeks to include NSAID's and/or analgesics, corticosteriod injections, viscosupplementation injections and activity modification.  Onset of symptoms was gradual, starting 2+ years ago with gradually worsening course since that time. The patient noted no past surgery on the left knee(s).  Patient currently rates pain in the left knee(s) at 10 out of 10 with activity. Patient has night pain, worsening of pain with activity and weight bearing, pain that interferes with activities of daily living, pain with passive range of motion, crepitus and joint swelling.  Patient has evidence of periarticular osteophytes and joint space narrowing by imaging studies.  There is no active infection.  Risks, benefits and expectations were discussed with the patient.  Risks including but not limited to the risk of anesthesia, blood clots, nerve damage, blood vessel damage, failure of the prosthesis, infection and up to and including death.  Patient understand the risks, benefits and expectations and wishes to proceed with surgery.   PCP: Kirstie Peri, MD  D/C Plans:       Home  Post-op Meds:       No Rx given   Tranexamic Acid:      To be given - IV   Decadron:      Is to be given  FYI:      ASA  Norco  DME:   Pt already has equipment   PT:   OPPT     Patient Active Problem List   Diagnosis Date Noted  . MDD (major depressive disorder), recurrent episode, moderate (HCC) 11/15/2016  . DEGENERATIVE JOINT DISEASE, KNEE 01/17/2007  . HIGH BLOOD PRESSURE 01/17/2007   Past Medical History:  Diagnosis Date  . Anxiety   . Arthritis   . Complication of anesthesia   .  Depression   . Hypertension   . PONV (postoperative nausea and vomiting)     Past Surgical History:  Procedure Laterality Date  . FOOT NEUROMA SURGERY     right foot  . KNEE SURGERY     right arthroscopic    No current facility-administered medications for this encounter.    Current Outpatient Medications  Medication Sig Dispense Refill Last Dose  . acetaminophen (TYLENOL) 500 MG tablet Take 1,000 mg by mouth every 6 (six) hours as needed for moderate pain or headache.     Marland Kitchen amLODipine (NORVASC) 10 MG tablet Take 10 mg by mouth daily.    Taking  . buPROPion (WELLBUTRIN XL) 150 MG 24 hr tablet Take 1 tablet (150 mg total) by mouth daily. 90 tablet 0   . escitalopram (LEXAPRO) 20 MG tablet Take 1 tablet (20 mg total) by mouth daily. (Patient taking differently: Take 20 mg by mouth at bedtime. ) 90 tablet 0   . LORazepam (ATIVAN) 1 MG tablet Take 1 tablet (1 mg total) by mouth daily as needed for anxiety. 30 tablet 2   . metoprolol tartrate (LOPRESSOR) 25 MG tablet Take 25 mg by mouth 2 (two) times daily.   Taking  . traZODone (DESYREL) 50 MG tablet Take 1 tablet (50 mg total) by mouth at bedtime as needed for sleep. 90 tablet 0   . celecoxib (CELEBREX) 100 MG capsule Take  100 mg by mouth 2 (two) times daily.   Taking   Allergies  Allergen Reactions  . Percocet [Oxycodone-Acetaminophen] Nausea And Vomiting    Social History   Tobacco Use  . Smoking status: Current Every Day Smoker    Packs/day: 0.75    Types: Cigarettes    Start date: 10/15/2017  . Smokeless tobacco: Never Used  . Tobacco comment: smokes 2 cigarettes a day  Substance Use Topics  . Alcohol use: Yes    Comment: occasional. 11-15-2016 occa    Family History  Problem Relation Age of Onset  . Drug abuse Son      Review of Systems  Constitutional: Negative.   HENT: Negative.   Eyes: Negative.   Respiratory: Negative.   Cardiovascular: Negative.   Gastrointestinal: Negative.   Genitourinary: Negative.    Musculoskeletal: Positive for joint pain.  Skin: Negative.   Neurological: Negative.   Endo/Heme/Allergies: Negative.   Psychiatric/Behavioral: Positive for depression. The patient is nervous/anxious.     Objective:  Physical Exam  Constitutional: She is oriented to person, place, and time. She appears well-developed.  HENT:  Head: Normocephalic.  Eyes: Pupils are equal, round, and reactive to light.  Neck: Neck supple. No JVD present. No tracheal deviation present. No thyromegaly present.  Cardiovascular: Normal rate, regular rhythm and intact distal pulses.  Respiratory: Effort normal and breath sounds normal. No respiratory distress. She has no wheezes.  GI: Soft. There is no abdominal tenderness. There is no guarding.  Musculoskeletal:     Right hip: She exhibits decreased range of motion, decreased strength, tenderness and bony tenderness. She exhibits no swelling, no deformity and no laceration.  Lymphadenopathy:    She has no cervical adenopathy.  Neurological: She is alert and oriented to person, place, and time.  Skin: Skin is warm and dry.  Psychiatric: She has a normal mood and affect.      Labs:  Estimated body mass index is 29.66 kg/m as calculated from the following:   Height as of 03/01/18: 5\' 1"  (1.549 m).   Weight as of 03/01/18: 71.2 kg.   Imaging Review Plain radiographs demonstrate severe degenerative joint disease of the left knee.  The bone quality appears to be good for age and reported activity level.   Preoperative templating of the joint replacement has been completed, documented, and submitted to the Operating Room personnel in order to optimize intra-operative equipment management.    Patient's anticipated LOS is less than 2 midnights, meeting these requirements: - Younger than 2065 - Lives within 1 hour of care - Has a competent adult at home to recover with post-op recover - NO history of  - Chronic pain requiring opiods  - Diabetes  -  Coronary Artery Disease  - Heart failure  - Heart attack  - Stroke  - DVT/VTE  - Cardiac arrhythmia  - Respiratory Failure/COPD  - Renal failure  - Anemia  - Advanced Liver disease    Assessment/Plan:  End stage arthritis, left knee   The patient history, physical examination, clinical judgment of the provider and imaging studies are consistent with end stage degenerative joint disease of the left knee and total knee arthroplasty is deemed medically necessary. The treatment options including medical management, injection therapy arthroscopy and arthroplasty were discussed at length. The risks and benefits of total knee arthroplasty were presented and reviewed. The risks due to aseptic loosening, infection, stiffness, patella tracking problems, thromboembolic complications and other imponderables were discussed. The patient acknowledged the explanation, agreed  to proceed with the plan and consent was signed. Patient is being admitted for inpatient treatment for surgery, pain control, PT, OT, prophylactic antibiotics, VTE prophylaxis, progressive ambulation and ADL's and discharge planning. The patient is planning to be discharged home.    Anastasio Auerbach Balthazar Dooly   PA-C  03/05/2018, 10:06 AM

## 2018-03-06 ENCOUNTER — Encounter (HOSPITAL_COMMUNITY): Payer: Self-pay | Admitting: Orthopedic Surgery

## 2018-03-06 DIAGNOSIS — E663 Overweight: Secondary | ICD-10-CM | POA: Diagnosis present

## 2018-03-06 DIAGNOSIS — M1712 Unilateral primary osteoarthritis, left knee: Secondary | ICD-10-CM | POA: Diagnosis not present

## 2018-03-06 LAB — BASIC METABOLIC PANEL
Anion gap: 6 (ref 5–15)
BUN: 9 mg/dL (ref 8–23)
CO2: 24 mmol/L (ref 22–32)
Calcium: 8.7 mg/dL — ABNORMAL LOW (ref 8.9–10.3)
Chloride: 105 mmol/L (ref 98–111)
Creatinine, Ser: 0.6 mg/dL (ref 0.44–1.00)
GFR calc Af Amer: >60 mL/min (ref >60)
GFR calc non Af Amer: >60 mL/min (ref >60)
Glucose, Bld: 143 mg/dL — ABNORMAL HIGH (ref 70–99)
Potassium: 3.8 mmol/L (ref 3.5–5.1)
Sodium: 135 mmol/L (ref 135–145)

## 2018-03-06 LAB — CBC
HCT: 33.8 % — ABNORMAL LOW (ref 36.0–46.0)
Hemoglobin: 11.2 g/dL — ABNORMAL LOW (ref 12.0–15.0)
MCH: 30.5 pg (ref 26.0–34.0)
MCHC: 33.1 g/dL (ref 30.0–36.0)
MCV: 92.1 fL (ref 80.0–100.0)
Platelets: 253 10*3/uL (ref 150–400)
RBC: 3.67 MIL/uL — ABNORMAL LOW (ref 3.87–5.11)
RDW: 12.4 % (ref 11.5–15.5)
WBC: 9.3 10*3/uL (ref 4.0–10.5)
nRBC: 0 % (ref 0.0–0.2)

## 2018-03-06 MED ORDER — POLYETHYLENE GLYCOL 3350 17 G PO PACK: 17.0000 g | PACK | Freq: Two times a day (BID) | ORAL | 0 refills | Status: DC

## 2018-03-06 MED ORDER — METHOCARBAMOL 500 MG PO TABS: 500.0000 mg | ORAL_TABLET | Freq: Four times a day (QID) | ORAL | 0 refills | Status: DC | PRN

## 2018-03-06 MED ORDER — HYDROCODONE-ACETAMINOPHEN 7.5-325 MG PO TABS: 1.0000 | ORAL_TABLET | ORAL | 0 refills | Status: DC | PRN

## 2018-03-06 MED ORDER — DOCUSATE SODIUM 100 MG PO CAPS: 100.0000 mg | ORAL_CAPSULE | Freq: Two times a day (BID) | ORAL | 0 refills | Status: DC

## 2018-03-06 MED ORDER — ASPIRIN 81 MG PO CHEW: 81.0000 mg | CHEWABLE_TABLET | Freq: Two times a day (BID) | ORAL | 0 refills | Status: AC

## 2018-03-06 MED ORDER — FERROUS SULFATE 325 (65 FE) MG PO TABS: 325.0000 mg | ORAL_TABLET | Freq: Three times a day (TID) | ORAL | 3 refills | Status: DC

## 2018-03-06 NOTE — Progress Notes (Signed)
Physical Therapy Treatment Patient Details Name: [name] MRN: [id number] DOB: 1955/11/01 Today's Date: 03/06/2018    History of Present Illness 63 y.o. female admitted on 03/05/18 for elective L TKA.  Pt with significant PMH of R knee arthroscopy, HTN, and R foot neruoma surgery.     PT Comments    Pt is progressing well and is ready to DC home from PT standpoint. She ambulated 24' with RW, completed stair training, and demonstrates good understanding of HEP. Encouraged frequent ambulation at home and performance of TKA HEP 3x/day, pt has outpt PT appointment on 03/12/18.    Follow Up Recommendations  Follow surgeon's recommendation for DC plan and follow-up therapies(outpt PT scheduled for 03/12/18)     Equipment Recommendations  None recommended by PT    Recommendations for Other Services       Precautions / Restrictions Precautions Precautions: Knee Precaution Booklet Issued: Yes (comment) Precaution Comments: knee exercise handout given and precautions reviewed Restrictions Weight Bearing Restrictions: No LLE Weight Bearing: Weight bearing as tolerated    Mobility  Bed Mobility Overal bed mobility: Needs Assistance Bed Mobility: Supine to Sit     Supine to sit: HOB elevated;Modified independent (Device/Increase time)     General bed mobility comments: no assist needed  Transfers Overall transfer level: Needs assistance Equipment used: Rolling walker (2 wheeled) Transfers: Sit to/from Stand Sit to Stand: Supervision         General transfer comment: VCs hand placement  Ambulation/Gait Ambulation/Gait assistance: Supervision Gait Distance (Feet): 130 Feet Assistive device: Rolling walker (2 wheeled) Gait Pattern/deviations: Step-to pattern Gait velocity: decr   General Gait Details: steady with RW, no buckling, VCs to lift head   Stairs Stairs: Yes Stairs assistance: Min guard Stair Management: Two rails;Step to pattern;Forwards Number of  Stairs: 3 General stair comments: VCs sequencing, min/guard safety   Wheelchair Mobility    Modified Rankin (Stroke Patients Only)       Balance Overall balance assessment: Needs assistance Sitting-balance support: Feet supported;No upper extremity supported Sitting balance-Leahy Scale: Good       Standing balance-Leahy Scale: Fair Standing balance comment: BUE support for dynamic                             Cognition Arousal/Alertness: Awake/alert Behavior During Therapy: WFL for tasks assessed/performed Overall Cognitive Status: Within Functional Limits for tasks assessed                                        Exercises Total Joint Exercises Ankle Circles/Pumps: AROM;Both;15 reps Quad Sets: AROM;Left;10 reps Towel Squeeze: AROM;Both;10 reps Short Arc Quad: AROM;Left;10 reps;Supine Heel Slides: AAROM;Left;10 reps;Supine Hip ABduction/ADduction: AROM;Left;10 reps;Supine Straight Leg Raises: AROM;Left;10 reps;Supine Long Arc Quad: AROM;Left;10 reps;Seated Knee Flexion: AROM;Left;AAROM;10 reps;Seated Goniometric ROM: 5-85* AAROM L knee    General Comments        Pertinent Vitals/Pain Pain Score: 7  Pain Location: left knee/thigh Pain Descriptors / Indicators: Sore Pain Intervention(s): Limited activity within patient's tolerance;Monitored during session;Premedicated before session;Ice applied    Home Living                      Prior Function            PT Goals (current goals can now be found in the care plan section) Acute Rehab PT Goals Patient  Stated Goal: go to yard sales, walk at a school track PT Goal Formulation: With patient Time For Goal Achievement: 03/12/18 Potential to Achieve Goals: Good Progress towards PT goals: Progressing toward goals    Frequency    7X/week      PT Plan Current plan remains appropriate    Co-evaluation              AM-PAC PT "6 Clicks" Mobility   Outcome  Measure  Help needed turning from your back to your side while in a flat bed without using bedrails?: None Help needed moving from lying on your back to sitting on the side of a flat bed without using bedrails?: None Help needed moving to and from a bed to a chair (including a wheelchair)?: None Help needed standing up from a chair using your arms (e.g., wheelchair or bedside chair)?: None Help needed to walk in hospital room?: None Help needed climbing 3-5 steps with a railing? : A Little 6 Click Score: 23    End of Session Equipment Utilized During Treatment: Gait belt Activity Tolerance: Patient tolerated treatment well Patient left: in chair;with call bell/phone within reach;with chair alarm set Nurse Communication: Mobility status PT Visit Diagnosis: Muscle weakness (generalized) (M62.81);Difficulty in walking, not elsewhere classified (R26.2);Pain Pain - Right/Left: Left Pain - part of body: Knee     Time: 9702-6378 PT Time Calculation (min) (ACUTE ONLY): 32 min  Charges:  $Gait Training: 8-22 mins $Therapeutic Exercise: 8-22 mins                     Ralene Bathe Kistler PT 03/06/2018  Acute Rehabilitation Services Pager [phone] Office [phone]

## 2018-03-06 NOTE — Progress Notes (Signed)
     Subjective: 1 Day Post-Op Procedure(s) (LRB): TOTAL KNEE ARTHROPLASTY (Left)   Patient reports pain as mild, pain controlled.  No events throughout the night. Discussed that she already has OPPT setup.  Ready to be discharged home.    Patient's anticipated LOS is less than 2 midnights, meeting these requirements: - Younger than 68 - Lives within 1 hour of care - Has a competent adult at home to recover with post-op recover - NO history of  - Chronic pain requiring opiods  - Diabetes  - Coronary Artery Disease  - Heart failure  - Heart attack  - Stroke  - DVT/VTE  - Cardiac arrhythmia  - Respiratory Failure/COPD  - Renal failure  - Anemia  - Advanced Liver disease    Objective:   VITALS:   Vitals:   03/06/18 0129 03/06/18 0517  BP: 135/90 (!) 125/98  Pulse: 82 77  Resp: 20 16  Temp: 97.7 F (36.5 C) 97.7 F (36.5 C)  SpO2: 98% 98%    Dorsiflexion/Plantar flexion intact Incision: dressing C/D/I No cellulitis present Compartment soft  LABS Recent Labs    03/06/18 0548  HGB 11.2*  HCT 33.8*  WBC 9.3  PLT 253    Recent Labs    03/06/18 0548  NA 135  K 3.8  BUN 9  CREATININE 0.60  GLUCOSE 143*     Assessment/Plan: 1 Day Post-Op Procedure(s) (LRB): TOTAL KNEE ARTHROPLASTY (Left) Foley cath d/c'ed Advance diet Up with therapy D/C IV fluids Discharge home Follow up in 2 weeks at [facility]). Follow up with OLIN,Jamario Colina D in 2 weeks.  Contact information:  [facility]) [address], [address] [place] [address] [phone]    Overweight (BMI 25-29.9) Estimated body mass index is 29.99 kg/m as calculated from the following:   Height as of this encounter: 5\' 1"  (1.549 m).   Weight as of this encounter: 72 kg. Patient also counseled that weight may inhibit the healing process Patient counseled that losing weight will help with future health issues        Anastasio Auerbach. Trannie Bardales   PAC  03/06/2018, 8:38 AM

## 2018-03-07 NOTE — Discharge Summary (Signed)
Physician Discharge Summary  Patient ID: [name] MRN: [id number] DOB/AGE: 09-09-1955 63 y.o.  Admit date: 03/05/2018 Discharge date: 03/06/2018   Procedures:  Procedure(s) (LRB): TOTAL KNEE ARTHROPLASTY (Left)  Attending Physician:  Dr. Durene Romans   Admission Diagnoses:   Left knee primary OA / pain  Discharge Diagnoses:  Principal Problem:   S/P left TKA Active Problems:   Status post left hip replacement   Overweight (BMI 25.0-29.9)  Past Medical History:  Diagnosis Date  . Anxiety   . Arthritis   . Complication of anesthesia   . Depression   . Hypertension   . PONV (postoperative nausea and vomiting)     HPI:     [name], 63 y.o. female, has a history of pain and functional disability in the left knee due to arthritis and has failed non-surgical conservative treatments for greater than 12 weeks to include NSAID's and/or analgesics, corticosteriod injections, viscosupplementation injections and activity modification.  Onset of symptoms was gradual, starting 2+ years ago with gradually worsening course since that time. The patient noted no past surgery on the left knee(s).  Patient currently rates pain in the left knee(s) at 10 out of 10 with activity. Patient has night pain, worsening of pain with activity and weight bearing, pain that interferes with activities of daily living, pain with passive range of motion, crepitus and joint swelling.  Patient has evidence of periarticular osteophytes and joint space narrowing by imaging studies.  There is no active infection.  Risks, benefits and expectations were discussed with the patient.  Risks including but not limited to the risk of anesthesia, blood clots, nerve damage, blood vessel damage, failure of the prosthesis, infection and up to and including death.  Patient understand the risks, benefits and expectations and wishes to proceed with surgery.   PCP: Faith Peri, MD   Discharged Condition:  good  Hospital Course:  Patient underwent the above stated procedure on 03/05/2018. Patient tolerated the procedure well and brought to the recovery room in good condition and subsequently to the floor.  POD #1 BP: 125/98 ; Pulse: 77 ; Temp: 97.7 F (36.5 C) ; Resp: 16 Patient reports pain as mild, pain controlled.  No events throughout the night. Discussed that she already has OPPT setup.  Ready to be discharged home.  Dorsiflexion/plantar flexion intact, incision: dressing C/D/I, no cellulitis present and compartment soft.   LABS  Basename    HGB     11.2  HCT     33.8    Discharge Exam: General appearance: alert, cooperative and no distress Extremities: Homans sign is negative, no sign of DVT, no edema, redness or tenderness in the calves or thighs and no ulcers, gangrene or trophic changes  Disposition:  Home with follow up in 2 weeks   Follow-up Information    Durene Romans, MD. Schedule an appointment as soon as possible for a visit in 2 weeks.   Specialty:  Orthopedic Surgery Contact information: [address] [place] [address] [phone]           Discharge Instructions    Call MD / Call 911   Complete by:  As directed    If you experience chest pain or shortness of breath, CALL 911 and be transported to the hospital emergency room.  If you develope a fever above 101 F, pus (white drainage) or increased drainage or redness at the wound, or calf pain, call your surgeon's office.   Change dressing  Complete by:  As directed    Maintain surgical dressing until follow up in the clinic. If the edges start to pull up, may reinforce with tape. If the dressing is no longer working, may remove and cover with gauze and tape, but must keep the area dry and clean.  Call with any questions or concerns.   Constipation Prevention   Complete by:  As directed    Drink plenty of fluids.  Prune juice may be helpful.  You may use a stool softener, such as Colace  (over the counter) 100 mg twice a day.  Use MiraLax (over the counter) for constipation as needed.   Diet - low sodium heart healthy   Complete by:  As directed    Discharge instructions   Complete by:  As directed    Maintain surgical dressing until follow up in the clinic. If the edges start to pull up, may reinforce with tape. If the dressing is no longer working, may remove and cover with gauze and tape, but must keep the area dry and clean.  [facility]. Call with any questions or concerns.   Increase activity slowly as tolerated   Complete by:  As directed    Weight bearing as tolerated with assist device (walker, cane, etc) as directed, use it as long as suggested by your surgeon or therapist, typically at least 4-6 weeks.   TED hose   Complete by:  As directed    Use stockings (TED hose) for 2 weeks on both leg(s).  You may remove them at night for sleeping.      Allergies as of 03/06/2018      Reactions   Percocet [oxycodone-acetaminophen] Nausea And Vomiting      Medication List    STOP taking these medications   acetaminophen 500 MG tablet Commonly known as:  TYLENOL     TAKE these medications   amLODipine 10 MG tablet Commonly known as:  NORVASC Take 10 mg by mouth daily.   aspirin 81 MG chewable tablet Commonly known as:  ASPIRIN CHILDRENS Chew 1 tablet (81 mg total) by mouth 2 (two) times daily for 30 days. Take for 4 weeks, then resume regular dose.   buPROPion 150 MG 24 hr tablet Commonly known as:  WELLBUTRIN XL Take 1 tablet (150 mg total) by mouth daily.   celecoxib 100 MG capsule Commonly known as:  CELEBREX Take 100 mg by mouth 2 (two) times daily.   docusate sodium 100 MG capsule Commonly known as:  COLACE Take 1 capsule (100 mg total) by mouth 2 (two) times daily.   escitalopram 20 MG tablet Commonly known as:  LEXAPRO Take 1 tablet (20 mg total) by mouth daily. What changed:  when to take this   ferrous  sulfate 325 (65 FE) MG tablet Commonly known as:  FERROUSUL Take 1 tablet (325 mg total) by mouth 3 (three) times daily with meals.   HYDROcodone-acetaminophen 7.5-325 MG tablet Commonly known as:  NORCO Take 1-2 tablets by mouth every 4 (four) hours as needed for moderate pain.   LORazepam 1 MG tablet Commonly known as:  ATIVAN Take 1 tablet (1 mg total) by mouth daily as needed for anxiety.   methocarbamol 500 MG tablet Commonly known as:  ROBAXIN Take 1 tablet (500 mg total) by mouth every 6 (six) hours as needed for muscle spasms.   metoprolol tartrate 25 MG tablet Commonly known as:  LOPRESSOR Take 25 mg by  mouth 2 (two) times daily.   polyethylene glycol packet Commonly known as:  MIRALAX / GLYCOLAX Take 17 g by mouth 2 (two) times daily.   traZODone 50 MG tablet Commonly known as:  DESYREL Take 1 tablet (50 mg total) by mouth at bedtime as needed for sleep.            Discharge Care Instructions  (From admission, onward)         Start     Ordered   03/06/18 0000  Change dressing    Comments:  Maintain surgical dressing until follow up in the clinic. If the edges start to pull up, may reinforce with tape. If the dressing is no longer working, may remove and cover with gauze and tape, but must keep the area dry and clean.  Call with any questions or concerns.   03/06/18 0845           Signed: Anastasio AuerbachMatthew S. Angellee Cohill   PA-C  03/07/2018, 8:20 AM

## 2018-03-12 DIAGNOSIS — R262 Difficulty in walking, not elsewhere classified: Secondary | ICD-10-CM | POA: Diagnosis not present

## 2018-03-12 DIAGNOSIS — M25562 Pain in left knee: Secondary | ICD-10-CM | POA: Diagnosis not present

## 2018-03-12 DIAGNOSIS — Z96652 Presence of left artificial knee joint: Secondary | ICD-10-CM | POA: Diagnosis not present

## 2018-04-01 NOTE — H&P (Signed)
TOTAL KNEE ADMISSION H&P  Patient is being admitted for right total knee arthroplasty.  Subjective:  Chief Complaint:   Right knee primary OA / pain  HPI: [name], 63 y.o. female, has a history of pain and functional disability in the right knee due to arthritis and has failed non-surgical conservative treatments for greater than 12 weeks to include NSAID's and/or analgesics, corticosteriod injections, viscosupplementation injections and activity modification.  Onset of symptoms was gradual, starting 2+ years ago with gradually worsening course since that time. The patient noted prior procedures on the knee to include  arthroplasty on the left knee per Dr. Charlann Boxer on 03/05/2018.  Patient currently rates pain in the right knee(s) at 9 out of 10 with activity. Patient has night pain, worsening of pain with activity and weight bearing, pain that interferes with activities of daily living, pain with passive range of motion, crepitus and joint swelling.  Patient has evidence of periarticular osteophytes and joint space narrowing by imaging studies. There is no active infection.  Risks, benefits and expectations were discussed with the patient.  Risks including but not limited to the risk of anesthesia, blood clots, nerve damage, blood vessel damage, failure of the prosthesis, infection and up to and including death.  Patient understand the risks, benefits and expectations and wishes to proceed with surgery.   PCP: Kirstie Peri, MD  D/C Plans:       Home  Post-op Meds:       No Rx given  Tranexamic Acid:      To be given - IV   Decadron:      Is to be given  FYI:      ASA  Norco  DME:   Pt already has equipment    PT:   OPPT    Patient Active Problem List   Diagnosis Date Noted  . Overweight (BMI 25.0-29.9) 03/06/2018  . S/P left TKA 03/05/2018  . Status post left hip replacement 03/05/2018  . MDD (major depressive disorder), recurrent episode, moderate (HCC) 11/15/2016  .  DEGENERATIVE JOINT DISEASE, KNEE 01/17/2007  . HIGH BLOOD PRESSURE 01/17/2007   Past Medical History:  Diagnosis Date  . Anxiety   . Arthritis   . Complication of anesthesia   . Depression   . Hypertension   . PONV (postoperative nausea and vomiting)     Past Surgical History:  Procedure Laterality Date  . FOOT NEUROMA SURGERY     right foot  . KNEE SURGERY     right arthroscopic  . TOTAL KNEE ARTHROPLASTY Left 03/05/2018   Procedure: TOTAL KNEE ARTHROPLASTY;  Surgeon: Durene Romans, MD;  Location: [facility];  Service: Orthopedics;  Laterality: Left;  70 minutes    No current facility-administered medications for this encounter.    Current Outpatient Medications  Medication Sig Dispense Refill Last Dose  . amLODipine (NORVASC) 10 MG tablet Take 10 mg by mouth daily.    03/05/2018 at 0830  . aspirin (ASPIRIN CHILDRENS) 81 MG chewable tablet Chew 1 tablet (81 mg total) by mouth 2 (two) times daily for 30 days. Take for 4 weeks, then resume regular dose. 60 tablet 0   . buPROPion (WELLBUTRIN XL) 150 MG 24 hr tablet Take 1 tablet (150 mg total) by mouth daily. 90 tablet 0 03/05/2018 at 0830  . celecoxib (CELEBREX) 100 MG capsule Take 100 mg by mouth 2 (two) times daily.   Past Month at Unknown time  . escitalopram (LEXAPRO) 20 MG tablet Take 1 tablet (  20 mg total) by mouth daily. (Patient taking differently: Take 20 mg by mouth at bedtime. ) 90 tablet 0 03/04/2018 at Unknown time  . HYDROcodone-acetaminophen (NORCO/VICODIN) 5-325 MG tablet Take 1 tablet by mouth every 6 (six) hours as needed for moderate pain.     Marland Kitchen LORazepam (ATIVAN) 1 MG tablet Take 1 tablet (1 mg total) by mouth daily as needed for anxiety. 30 tablet 2 03/04/2018 at Unknown time  . methocarbamol (ROBAXIN) 500 MG tablet Take 1 tablet (500 mg total) by mouth every 6 (six) hours as needed for muscle spasms. 40 tablet 0   . metoprolol tartrate (LOPRESSOR) 25 MG tablet Take 25 mg by mouth 2 (two) times daily.   03/05/2018 at 0830   . traZODone (DESYREL) 50 MG tablet Take 1 tablet (50 mg total) by mouth at bedtime as needed for sleep. 90 tablet 0 03/04/2018 at Unknown time  . docusate sodium (COLACE) 100 MG capsule Take 1 capsule (100 mg total) by mouth 2 (two) times daily. (Patient not taking: Reported on 03/28/2018) 10 capsule 0 Not Taking at Unknown time  . ferrous sulfate (FERROUSUL) 325 (65 FE) MG tablet Take 1 tablet (325 mg total) by mouth 3 (three) times daily with meals. (Patient not taking: Reported on 03/28/2018)  3 Not Taking at Unknown time  . HYDROcodone-acetaminophen (NORCO) 7.5-325 MG tablet Take 1-2 tablets by mouth every 4 (four) hours as needed for moderate pain. (Patient not taking: Reported on 03/28/2018) 60 tablet 0 Not Taking at Unknown time  . polyethylene glycol (MIRALAX / GLYCOLAX) packet Take 17 g by mouth 2 (two) times daily. (Patient not taking: Reported on 03/28/2018) 14 each 0 Not Taking at Unknown time   Allergies  Allergen Reactions  . Percocet [Oxycodone-Acetaminophen] Nausea And Vomiting    Social History   Tobacco Use  . Smoking status: Current Every Day Smoker    Packs/day: 0.75    Types: Cigarettes    Start date: 10/15/2017  . Smokeless tobacco: Never Used  . Tobacco comment: smokes 2 cigarettes a day  Substance Use Topics  . Alcohol use: Yes    Comment: occasional. 11-15-2016 occa    Family History  Problem Relation Age of Onset  . Drug abuse Son      Review of Systems  Constitutional: Negative.   HENT: Negative.   Eyes: Negative.   Respiratory: Negative.   Cardiovascular: Negative.   Gastrointestinal: Negative.   Genitourinary: Negative.   Musculoskeletal: Positive for joint pain.  Skin: Negative.   Neurological: Negative.   Endo/Heme/Allergies: Negative.   Psychiatric/Behavioral: Positive for depression. The patient is nervous/anxious.     Objective:  Physical Exam  Constitutional: She is oriented to person, place, and time. She appears well-developed.  HENT:   Head: Normocephalic.  Eyes: Pupils are equal, round, and reactive to light.  Neck: Neck supple. No JVD present. No tracheal deviation present. No thyromegaly present.  Cardiovascular: Normal rate, regular rhythm and intact distal pulses.  Respiratory: Effort normal and breath sounds normal. No respiratory distress. She has no wheezes.  GI: Soft. There is no abdominal tenderness. There is no guarding.  Musculoskeletal:     Left knee: She exhibits decreased range of motion, swelling and bony tenderness. She exhibits no ecchymosis, no deformity, no laceration and no erythema. Tenderness found.  Lymphadenopathy:    She has no cervical adenopathy.  Neurological: She is alert and oriented to person, place, and time.  Skin: Skin is warm and dry.  Psychiatric: She has a  normal mood and affect.      Labs:  Estimated body mass index is 29.99 kg/m as calculated from the following:   Height as of 03/05/18: 5\' 1"  (1.549 m).   Weight as of 03/05/18: 72 kg.   Imaging Review Plain radiographs demonstrate severe degenerative joint disease of the right knee.  The bone quality appears to be good for age and reported activity level.      Assessment/Plan:  End stage arthritis, right knee   The patient history, physical examination, clinical judgment of the provider and imaging studies are consistent with end stage degenerative joint disease of the right knee(s) and total knee arthroplasty is deemed medically necessary. The treatment options including medical management, injection therapy arthroscopy and arthroplasty were discussed at length. The risks and benefits of total knee arthroplasty were presented and reviewed. The risks due to aseptic loosening, infection, stiffness, patella tracking problems, thromboembolic complications and other imponderables were discussed. The patient acknowledged the explanation, agreed to proceed with the plan and consent was signed. Patient is being admitted for  inpatient treatment for surgery, pain control, PT, OT, prophylactic antibiotics, VTE prophylaxis, progressive ambulation and ADL's and discharge planning. The patient is planning to be discharged home.    Patient's anticipated LOS is less than 2 midnights, meeting these requirements: - Younger than 23 - Lives within 1 hour of care - Has a competent adult at home to recover with post-op recover - NO history of  - Chronic pain requiring opiods  - Diabetes  - Coronary Artery Disease  - Heart failure  - Heart attack  - Stroke  - DVT/VTE  - Cardiac arrhythmia  - Respiratory Failure/COPD  - Renal failure  - Anemia  - Advanced Liver disease     Anastasio Auerbach. Everli Rother   PA-C  04/01/2018, 12:18 PM

## 2018-04-03 NOTE — Progress Notes (Signed)
01-28-18 (Epic) EKG 

## 2018-04-03 NOTE — Patient Instructions (Signed)
[name]  04/03/2018   Your procedure is scheduled on: 04-09-18    Report to [facility] Main  Entrance    Report to Admitting at 1:30 PM    Call this number if you have problems the morning of surgery [phone]    Remember: You may have a Clear Liquid Diet from midnight until 10:00 AM. After 10:00 AM, nothing until after surgery    CLEAR LIQUID DIET   Foods Allowed                                                                     Foods Excluded  Coffee and tea, regular and decaf                             liquids that you cannot  Plain Jell-O in any flavor                                             see through such as: Fruit ices (not with fruit pulp)                                     milk, soups, orange juice  Iced Popsicles                                    All solid food Carbonated beverages, regular and diet                                    Cranberry, grape and apple juices Sports drinks like Gatorade Lightly seasoned clear broth or consume(fat free) Sugar, honey syrup  Sample Menu Breakfast                                Lunch                                     Supper Cranberry juice                    Beef broth                            Chicken broth Jell-O                                     Grape juice                           Apple juice Coffee or tea  Jell-O                                      Popsicle                                                Coffee or tea                        Coffee or tea  _____________________________________________________________________       BRUSH YOUR TEETH MORNING OF SURGERY AND RINSE YOUR MOUTH OUT, NO CHEWING GUM CANDY OR MINTS.     Take these medicines the morning of surgery with A SIP OF WATER: Amlodipine (Norvasc), Bupropion (Wellbutrin), Metoprolol Tartrate (Lopressor), and Lorazepam (Ativan), prn                                You may not have  any metal on your body including hair pins and              piercings  Do not wear jewelry, make-up, lotions, powders or perfumes, deodorant             Do not wear nail polish.  Do not shave  48 hours prior to surgery.            Do not bring valuables to the hospital.  IS NOT             RESPONSIBLE   FOR VALUABLES.  Contacts, dentures or bridgework may not be worn into surgery.  Leave suitcase in the car. After surgery it may be brought to your room.     Patients discharged the day of surgery will not be allowed to drive home. IF YOU ARE HAVING SURGERY AND GOING HOME THE SAME DAY, YOU MUST HAVE AN ADULT TO DRIVE YOU HOME AND BE WITH YOU FOR 24 HOURS. YOU MAY GO HOME BY TAXI OR UBER OR ORTHERWISE, BUT AN ADULT MUST ACCOMPANY YOU HOME AND STAY WITH YOU FOR 24 HOURS.   Special Instructions: N/A              Please read over the following fact sheets you were given: _____________________________________________________________________             [facility] - Preparing for Surgery Before surgery, you can play an important role.  Because skin is not sterile, your skin needs to be as free of germs as possible.  You can reduce the number of germs on your skin by washing with CHG (chlorahexidine gluconate) soap before surgery.  CHG is an antiseptic cleaner which kills germs and bonds with the skin to continue killing germs even after washing. Please DO NOT use if you have an allergy to CHG or antibacterial soaps.  If your skin becomes reddened/irritated stop using the CHG and inform your nurse when you arrive at Short Stay. Do not shave (including legs and underarms) for at least 48 hours prior to the first CHG shower.  You may shave your face/neck. Please follow these instructions carefully:  1.  Shower with CHG Soap the night before surgery and the  morning of Surgery.  2.  If you choose to wash your hair,  wash your hair first as usual with your  normal  shampoo.  3.  After you  shampoo, rinse your hair and body thoroughly to remove the  shampoo.                           4.  Use CHG as you would any other liquid soap.  You can apply chg directly  to the skin and wash                       Gently with a scrungie or clean washcloth.  5.  Apply the CHG Soap to your body ONLY FROM THE NECK DOWN.   Do not use on face/ open                           Wound or open sores. Avoid contact with eyes, ears mouth and genitals (private parts).                       Wash face,  Genitals (private parts) with your normal soap.             6.  Wash thoroughly, paying special attention to the area where your surgery  will be performed.  7.  Thoroughly rinse your body with warm water from the neck down.  8.  DO NOT shower/wash with your normal soap after using and rinsing off  the CHG Soap.                9.  Pat yourself dry with a clean towel.            10.  Wear clean pajamas.            11.  Place clean sheets on your bed the night of your first shower and do not  sleep with pets. Day of Surgery : Do not apply any lotions/deodorants the morning of surgery.  Please wear clean clothes to the hospital/surgery center.  FAILURE TO FOLLOW THESE INSTRUCTIONS MAY RESULT IN THE CANCELLATION OF YOUR SURGERY PATIENT SIGNATURE_________________________________  NURSE SIGNATURE__________________________________  ________________________________________________________________________   [name]  An incentive spirometer is a tool that can help keep your lungs clear and active. This tool measures how well you are filling your lungs with each breath. Taking long deep breaths may help reverse or decrease the chance of developing breathing (pulmonary) problems (especially infection) following:  A long period of time when you are unable to move or be active. BEFORE THE PROCEDURE   If the spirometer includes an indicator to show your best effort, your nurse or respiratory therapist  will set it to a desired goal.  If possible, sit up straight or lean slightly forward. Try not to slouch.  Hold the incentive spirometer in an upright position. INSTRUCTIONS FOR USE  1. Sit on the edge of your bed if possible, or sit up as far as you can in bed or on a chair. 2. Hold the incentive spirometer in an upright position. 3. Breathe out normally. 4. Place the mouthpiece in your mouth and seal your lips tightly around it. 5. Breathe in slowly and as deeply as possible, raising the piston or the ball toward the top of the column. 6. Hold your breath for 3-5 seconds or for as long as possible. Allow the piston or ball to fall to the bottom of the  column. 7. Remove the mouthpiece from your mouth and breathe out normally. 8. Rest for a few seconds and repeat Steps 1 through 7 at least 10 times every 1-2 hours when you are awake. Take your time and take a few normal breaths between deep breaths. 9. The spirometer may include an indicator to show your best effort. Use the indicator as a goal to work toward during each repetition. 10. After each set of 10 deep breaths, practice coughing to be sure your lungs are clear. If you have an incision (the cut made at the time of surgery), support your incision when coughing by placing a pillow or rolled up towels firmly against it. Once you are able to get out of bed, walk around indoors and cough well. You may stop using the incentive spirometer when instructed by your caregiver.  RISKS AND COMPLICATIONS  Take your time so you do not get dizzy or light-headed.  If you are in pain, you may need to take or ask for pain medication before doing incentive spirometry. It is harder to take a deep breath if you are having pain. AFTER USE  Rest and breathe slowly and easily.  It can be helpful to keep track of a log of your progress. Your caregiver can provide you with a simple table to help with this. If you are using the spirometer at home, follow  these instructions: SEEK MEDICAL CARE IF:   You are having difficultly using the spirometer.  You have trouble using the spirometer as often as instructed.  Your pain medication is not giving enough relief while using the spirometer.  You develop fever of 100.5 F (38.1 C) or higher. SEEK IMMEDIATE MEDICAL CARE IF:   You cough up bloody sputum that had not been present before.  You develop fever of 102 F (38.9 C) or greater.  You develop worsening pain at or near the incision site. MAKE SURE YOU:   Understand these instructions.  Will watch your condition.  Will get help right away if you are not doing well or get worse. Document Released: 06/05/2006 Document Revised: 04/17/2011 Document Reviewed: 08/06/2006 [employer] Patient Information 2014 [employer], [place].   ________________________________________________________________________  WHAT IS A BLOOD TRANSFUSION? Blood Transfusion Information  A transfusion is the replacement of blood or some of its parts. Blood is made up of multiple cells which provide different functions.  Red blood cells carry oxygen and are used for blood loss replacement.  White blood cells fight against infection.  Platelets control bleeding.  Plasma helps clot blood.  Other blood products are available for specialized needs, such as hemophilia or other clotting disorders. BEFORE THE TRANSFUSION  Who gives blood for transfusions?   Healthy volunteers who are fully evaluated to make sure their blood is safe. This is blood bank blood. Transfusion therapy is the safest it has ever been in the practice of medicine. Before blood is taken from a donor, a complete history is taken to make sure that person has no history of diseases nor engages in risky social behavior (examples are intravenous drug use or sexual activity with multiple partners). The donor's travel history is screened to minimize risk of transmitting infections, such as malaria. The  donated blood is tested for signs of infectious diseases, such as HIV and hepatitis. The blood is then tested to be sure it is compatible with you in order to minimize the chance of a transfusion reaction. If you or a relative donates blood, this is often done  in anticipation of surgery and is not appropriate for emergency situations. It takes many days to process the donated blood. RISKS AND COMPLICATIONS Although transfusion therapy is very safe and saves many lives, the main dangers of transfusion include:   Getting an infectious disease.  Developing a transfusion reaction. This is an allergic reaction to something in the blood you were given. Every precaution is taken to prevent this. The decision to have a blood transfusion has been considered carefully by your caregiver before blood is given. Blood is not given unless the benefits outweigh the risks. AFTER THE TRANSFUSION  Right after receiving a blood transfusion, you will usually feel much better and more energetic. This is especially true if your red blood cells have gotten low (anemic). The transfusion raises the level of the red blood cells which carry oxygen, and this usually causes an energy increase.  The nurse administering the transfusion will monitor you carefully for complications. HOME CARE INSTRUCTIONS  No special instructions are needed after a transfusion. You may find your energy is better. Speak with your caregiver about any limitations on activity for underlying diseases you may have. SEEK MEDICAL CARE IF:   Your condition is not improving after your transfusion.  You develop redness or irritation at the intravenous (IV) site. SEEK IMMEDIATE MEDICAL CARE IF:  Any of the following symptoms occur over the next 12 hours:  Shaking chills.  You have a temperature by mouth above 102 F (38.9 C), not controlled by medicine.  Chest, back, or muscle pain.  People around you feel you are not acting correctly or are  confused.  Shortness of breath or difficulty breathing.  Dizziness and fainting.  You get a rash or develop hives.  You have a decrease in urine output.  Your urine turns a dark color or changes to pink, red, or brown. Any of the following symptoms occur over the next 10 days:  You have a temperature by mouth above 102 F (38.9 C), not controlled by medicine.  Shortness of breath.  Weakness after normal activity.  The white part of the eye turns yellow (jaundice).  You have a decrease in the amount of urine or are urinating less often.  Your urine turns a dark color or changes to pink, red, or brown. Document Released: 01/21/2000 Document Revised: 04/17/2011 Document Reviewed: 09/09/2007 [place] Patient Information 2014 [place], [place].  _______________________________________________________________________

## 2018-04-04 ENCOUNTER — Encounter (HOSPITAL_COMMUNITY)
Admission: RE | Admit: 2018-04-04 | Discharge: 2018-04-04 | Disposition: A | Discharge status: Home / Self Care | Payer: 59 | Source: Ambulatory Visit | Attending: Orthopedic Surgery | Admitting: Orthopedic Surgery

## 2018-04-04 ENCOUNTER — Encounter (HOSPITAL_COMMUNITY): Payer: Self-pay

## 2018-04-04 ENCOUNTER — Other Ambulatory Visit: Payer: Self-pay

## 2018-04-04 DIAGNOSIS — Z01812 Encounter for preprocedural laboratory examination: Secondary | ICD-10-CM | POA: Insufficient documentation

## 2018-04-04 DIAGNOSIS — M1711 Unilateral primary osteoarthritis, right knee: Secondary | ICD-10-CM | POA: Insufficient documentation

## 2018-04-04 LAB — BASIC METABOLIC PANEL
Anion gap: 6 (ref 5–15)
BUN: 9 mg/dL (ref 8–23)
CO2: 26 mmol/L (ref 22–32)
Calcium: 9.1 mg/dL (ref 8.9–10.3)
Chloride: 102 mmol/L (ref 98–111)
Creatinine, Ser: 0.65 mg/dL (ref 0.44–1.00)
GFR calc Af Amer: >60 mL/min (ref >60)
GFR calc non Af Amer: >60 mL/min (ref >60)
Glucose, Bld: 97 mg/dL (ref 70–99)
Potassium: 3.7 mmol/L (ref 3.5–5.1)
Sodium: 134 mmol/L — ABNORMAL LOW (ref 135–145)

## 2018-04-04 LAB — CBC
HCT: 39.5 % (ref 36.0–46.0)
Hemoglobin: 13 g/dL (ref 12.0–15.0)
MCH: 31 pg (ref 26.0–34.0)
MCHC: 32.9 g/dL (ref 30.0–36.0)
MCV: 94 fL (ref 80.0–100.0)
PLATELETS: 253 10*3/uL (ref 150–400)
RBC: 4.2 MIL/uL (ref 3.87–5.11)
RDW: 12.5 % (ref 11.5–15.5)
WBC: 4.9 10*3/uL (ref 4.0–10.5)
nRBC: 0 % (ref 0.0–0.2)

## 2018-04-04 LAB — SURGICAL PCR SCREEN
MRSA, PCR: NEGATIVE
Staphylococcus aureus: NEGATIVE

## 2018-04-05 NOTE — Anesthesia Preprocedure Evaluation (Addendum)
Anesthesia Evaluation  Patient identified by MRN, date of birth, ID band Patient awake    Reviewed: Allergy & Precautions, NPO status , Patient's Chart, lab work & pertinent test results  History of Anesthesia Complications (+) PONV  Airway Mallampati: II  TM Distance: >3 FB Neck ROM: Full    Dental no notable dental hx. (+) Teeth Intact, Dental Advisory Given   Pulmonary Current Smoker,    Pulmonary exam normal breath sounds clear to auscultation       Cardiovascular hypertension, Pt. on medications Normal cardiovascular exam Rhythm:Regular Rate:Normal  EKG 01/28/18 NSR w NSST changes  Clearance BY Dr Andrey Farmer Cards 02/11/2018 Low risk   Neuro/Psych PSYCHIATRIC DISORDERS Anxiety Depression    GI/Hepatic negative GI ROS, Neg liver ROS,   Endo/Other    Renal/GU Cr 0.65     Musculoskeletal  (+) Arthritis ,   Abdominal   Peds  Hematology Hgb 13.0 Plts 253   Anesthesia Other Findings Underwent left TKA on 03/05/18 without complications.  Stable since that time  Reproductive/Obstetrics                           Anesthesia Physical Anesthesia Plan  ASA: III  Anesthesia Plan: Spinal   Post-op Pain Management:  Regional for Post-op pain   Induction:   PONV Risk Score and Plan: 2 and Treatment may vary due to age or medical condition, Ondansetron, Dexamethasone and Scopolamine patch - Pre-op  Airway Management Planned: Natural Airway and Nasal Cannula  Additional Equipment:   Intra-op Plan:   Post-operative Plan:   Informed Consent: I have reviewed the patients History and Physical, chart, labs and discussed the procedure including the risks, benefits and alternatives for the proposed anesthesia with the patient or authorized representative who has indicated his/her understanding and acceptance.     Dental advisory given  Plan Discussed with:   Anesthesia Plan Comments: (See PAT note  04/04/18, Jodell Cipro, PA-C  R TKA w Adductor Block under Spinal)       Anesthesia Quick Evaluation

## 2018-04-05 NOTE — Progress Notes (Signed)
Anesthesia Chart Review   Case:  [id number] Date/Time:  04/09/18 1545   Procedure:  TOTAL KNEE ARTHROPLASTY (Right ) - 70 mins   Anesthesia type:  Spinal   Pre-op diagnosis:  Right knee osteoarthritis   Location:  [facility] ROOM 10 / [facility]   Surgeon:  Durene Romans, MD      DISCUSSION:63 yo current every day smoker with h/o PONV, anxiety, depression, HTN, right knee OA scheduled for above surgery on 04/09/18 with Dr. Durene Romans.   Pt seen by cardiologist , Dr. Eppie Gibson, on 02/11/2018.  She has been prescribed Chantix working on smoking cessation.  Per Dr. Oliver Hum note (on Care Everywhere), stress test ordered prior to cardiac clearance.  Stress test 02/13/2018 with low risk findings.  Per Dr. Tenny Craw, "can proceed to knee surgery as scheduled."   Underwent left TKA on 03/05/18 without complications.  Stable since that time.    Pt can proceed with planned procedure barring acute status change.   VS: BP 130/86 (BP Location: Left Arm)   Pulse 79   Temp 36.7 C (Oral)   Resp 18   Ht 5\' 1"  (1.549 m)   Wt 72.2 kg   SpO2 97%   BMI 30.07 kg/m   PROVIDERS: Kirstie Peri, MD is PCP   Marlaine Hind, MD is Cardiologsit  LABS: Labs reviewed: Acceptable for surgery. (all labs ordered are listed, but only abnormal results are displayed)  Labs Reviewed  BASIC METABOLIC PANEL - Abnormal; Notable for the following components:      Result Value   Sodium 134 (*)    All other components within normal limits  SURGICAL PCR SCREEN  CBC  TYPE AND SCREEN     IMAGES:   EKG: 01/28/18  Rate 69 bpm Normal sinus rhythm ST & T wave abnormality, consider anterolateral ischemia -more prominent since previous Abnormal ECG  CV: Stress Test on 02/13/2018, per Dr. Oliver Hum note low risk study  Past Medical History:  Diagnosis Date  . Anxiety   . Arthritis   . Complication of anesthesia   . Depression   . Hypertension   . PONV (postoperative nausea and vomiting)     Past Surgical History:   Procedure Laterality Date  . FOOT NEUROMA SURGERY     right foot  . KNEE SURGERY     right arthroscopic  . TOTAL KNEE ARTHROPLASTY Left 03/05/2018   Procedure: TOTAL KNEE ARTHROPLASTY;  Surgeon: Durene Romans, MD;  Location: [facility];  Service: Orthopedics;  Laterality: Left;  70 minutes    MEDICATIONS: . amLODipine (NORVASC) 10 MG tablet  . aspirin (ASPIRIN CHILDRENS) 81 MG chewable tablet  . buPROPion (WELLBUTRIN XL) 150 MG 24 hr tablet  . celecoxib (CELEBREX) 100 MG capsule  . docusate sodium (COLACE) 100 MG capsule  . escitalopram (LEXAPRO) 20 MG tablet  . ferrous sulfate (FERROUSUL) 325 (65 FE) MG tablet  . HYDROcodone-acetaminophen (NORCO) 7.5-325 MG tablet  . HYDROcodone-acetaminophen (NORCO/VICODIN) 5-325 MG tablet  . LORazepam (ATIVAN) 1 MG tablet  . methocarbamol (ROBAXIN) 500 MG tablet  . metoprolol tartrate (LOPRESSOR) 25 MG tablet  . polyethylene glycol (MIRALAX / GLYCOLAX) packet  . traZODone (DESYREL) 50 MG tablet   No current facility-administered medications for this encounter.     Janey Genta [facility]Surgical Testing [phone] 04/05/18 4:22 PM

## 2018-04-05 NOTE — Progress Notes (Signed)
Chart reviewed with Jodell Cipro, PA. No new clearance needed, per PA's previous review of chart on 03-01-18.

## 2018-04-08 DIAGNOSIS — M25562 Pain in left knee: Secondary | ICD-10-CM | POA: Diagnosis not present

## 2018-04-09 ENCOUNTER — Other Ambulatory Visit: Payer: Self-pay

## 2018-04-09 ENCOUNTER — Observation Stay (HOSPITAL_COMMUNITY)
Admission: RE | Admit: 2018-04-09 | Discharge: 2018-04-10 | Disposition: A | Discharge status: Home / Self Care | Payer: 59 | Source: Other Acute Inpatient Hospital | Attending: Orthopedic Surgery | Admitting: Orthopedic Surgery

## 2018-04-09 ENCOUNTER — Telehealth (HOSPITAL_COMMUNITY): Payer: Self-pay | Admitting: *Deleted

## 2018-04-09 ENCOUNTER — Encounter (HOSPITAL_COMMUNITY): Payer: Self-pay | Admitting: *Deleted

## 2018-04-09 ENCOUNTER — Ambulatory Visit (HOSPITAL_COMMUNITY): Payer: 59 | Admitting: Certified Registered"

## 2018-04-09 ENCOUNTER — Ambulatory Visit (HOSPITAL_COMMUNITY): Payer: 59 | Admitting: Physician Assistant

## 2018-04-09 ENCOUNTER — Encounter (HOSPITAL_COMMUNITY)
Admission: RE | Disposition: A | Discharge status: Home / Self Care | Payer: Self-pay | Source: Other Acute Inpatient Hospital | Attending: Orthopedic Surgery

## 2018-04-09 DIAGNOSIS — Z6830 Body mass index (BMI) 30.0-30.9, adult: Secondary | ICD-10-CM | POA: Diagnosis not present

## 2018-04-09 DIAGNOSIS — G8918 Other acute postprocedural pain: Secondary | ICD-10-CM | POA: Diagnosis not present

## 2018-04-09 DIAGNOSIS — Z791 Long term (current) use of non-steroidal anti-inflammatories (NSAID): Secondary | ICD-10-CM | POA: Insufficient documentation

## 2018-04-09 DIAGNOSIS — Z885 Allergy status to narcotic agent status: Secondary | ICD-10-CM | POA: Insufficient documentation

## 2018-04-09 DIAGNOSIS — Z96651 Presence of right artificial knee joint: Secondary | ICD-10-CM

## 2018-04-09 DIAGNOSIS — F419 Anxiety disorder, unspecified: Secondary | ICD-10-CM | POA: Diagnosis not present

## 2018-04-09 DIAGNOSIS — M25461 Effusion, right knee: Secondary | ICD-10-CM | POA: Diagnosis not present

## 2018-04-09 DIAGNOSIS — I10 Essential (primary) hypertension: Secondary | ICD-10-CM | POA: Insufficient documentation

## 2018-04-09 DIAGNOSIS — M1711 Unilateral primary osteoarthritis, right knee: Principal | ICD-10-CM | POA: Insufficient documentation

## 2018-04-09 DIAGNOSIS — E669 Obesity, unspecified: Secondary | ICD-10-CM | POA: Diagnosis not present

## 2018-04-09 DIAGNOSIS — Z7982 Long term (current) use of aspirin: Secondary | ICD-10-CM | POA: Diagnosis not present

## 2018-04-09 DIAGNOSIS — F17210 Nicotine dependence, cigarettes, uncomplicated: Secondary | ICD-10-CM | POA: Diagnosis not present

## 2018-04-09 DIAGNOSIS — F329 Major depressive disorder, single episode, unspecified: Secondary | ICD-10-CM | POA: Diagnosis not present

## 2018-04-09 DIAGNOSIS — Z79899 Other long term (current) drug therapy: Secondary | ICD-10-CM | POA: Insufficient documentation

## 2018-04-09 HISTORY — PX: TOTAL KNEE ARTHROPLASTY: SHX125

## 2018-04-09 LAB — TYPE AND SCREEN
ABO/RH(D): A POS
Antibody Screen: NEGATIVE

## 2018-04-09 SURGERY — ARTHROPLASTY, KNEE, TOTAL
Anesthesia: Spinal | Site: Knee | Laterality: Right

## 2018-04-09 MED ORDER — FENTANYL CITRATE (PF) 100 MCG/2ML IJ SOLN: 25.0000 ug | INTRAMUSCULAR | Status: DC | PRN

## 2018-04-09 MED ORDER — ONDANSETRON HCL 4 MG PO TABS: 4.0000 mg | ORAL_TABLET | Freq: Four times a day (QID) | ORAL | Status: DC | PRN

## 2018-04-09 MED ORDER — AMLODIPINE BESYLATE 10 MG PO TABS
10.0000 mg | ORAL_TABLET | Freq: Every day | ORAL | Status: DC
  Administered 2018-04-10: 10 mg via ORAL
  Filled 2018-04-09: qty 1

## 2018-04-09 MED ORDER — DOCUSATE SODIUM 100 MG PO CAPS
100.0000 mg | ORAL_CAPSULE | Freq: Two times a day (BID) | ORAL | Status: DC
  Administered 2018-04-09 – 2018-04-10 (×2): 100 mg via ORAL
  Filled 2018-04-09 (×2): qty 1

## 2018-04-09 MED ORDER — BUPIVACAINE IN DEXTROSE 0.75-8.25 % IT SOLN
INTRATHECAL | Status: DC | PRN
  Administered 2018-04-09: 1.6 mL via INTRATHECAL

## 2018-04-09 MED ORDER — FERROUS SULFATE 325 (65 FE) MG PO TABS: 325.0000 mg | ORAL_TABLET | Freq: Three times a day (TID) | ORAL | 3 refills | Status: DC

## 2018-04-09 MED ORDER — HYDROCODONE-ACETAMINOPHEN 5-325 MG PO TABS
1.0000 | ORAL_TABLET | ORAL | Status: DC | PRN
  Administered 2018-04-09 – 2018-04-10 (×2): 2 via ORAL
  Filled 2018-04-09 (×2): qty 2

## 2018-04-09 MED ORDER — CEFAZOLIN SODIUM-DEXTROSE 2-4 GM/100ML-% IV SOLN
2.0000 g | Freq: Four times a day (QID) | INTRAVENOUS | Status: AC
  Administered 2018-04-09 – 2018-04-10 (×2): 2 g via INTRAVENOUS
  Filled 2018-04-09 (×2): qty 100

## 2018-04-09 MED ORDER — PHENOL 1.4 % MT LIQD
1.0000 | OROMUCOSAL | Status: DC | PRN
  Filled 2018-04-09: qty 177

## 2018-04-09 MED ORDER — CHLORHEXIDINE GLUCONATE 4 % EX LIQD: 60.0000 mL | Freq: Once | CUTANEOUS | Status: DC

## 2018-04-09 MED ORDER — PROPOFOL 500 MG/50ML IV EMUL
INTRAVENOUS | Status: DC | PRN
  Administered 2018-04-09: 75 ug/kg/min via INTRAVENOUS

## 2018-04-09 MED ORDER — KETOROLAC TROMETHAMINE 30 MG/ML IJ SOLN
INTRAMUSCULAR | Status: AC
  Filled 2018-04-09: qty 1

## 2018-04-09 MED ORDER — POLYETHYLENE GLYCOL 3350 17 G PO PACK: 17.0000 g | PACK | Freq: Two times a day (BID) | ORAL | 0 refills | Status: DC

## 2018-04-09 MED ORDER — PROPOFOL 10 MG/ML IV BOLUS
INTRAVENOUS | Status: AC
  Filled 2018-04-09: qty 20

## 2018-04-09 MED ORDER — MAGNESIUM CITRATE PO SOLN: 1.0000 | Freq: Once | ORAL | Status: DC | PRN

## 2018-04-09 MED ORDER — MENTHOL 3 MG MT LOZG: 1.0000 | LOZENGE | OROMUCOSAL | Status: DC | PRN

## 2018-04-09 MED ORDER — HYDROMORPHONE HCL 1 MG/ML IJ SOLN
0.5000 mg | INTRAMUSCULAR | Status: DC | PRN
  Administered 2018-04-09: 0.5 mg via INTRAVENOUS
  Administered 2018-04-10: 1 mg via INTRAVENOUS
  Filled 2018-04-09 (×2): qty 1

## 2018-04-09 MED ORDER — PROPOFOL 10 MG/ML IV BOLUS
INTRAVENOUS | Status: AC
  Filled 2018-04-09: qty 40

## 2018-04-09 MED ORDER — KETOROLAC TROMETHAMINE 30 MG/ML IJ SOLN
INTRAMUSCULAR | Status: DC | PRN
  Administered 2018-04-09: 30 mg

## 2018-04-09 MED ORDER — CELECOXIB 200 MG PO CAPS
200.0000 mg | ORAL_CAPSULE | Freq: Two times a day (BID) | ORAL | Status: DC
  Administered 2018-04-09 – 2018-04-10 (×2): 200 mg via ORAL
  Filled 2018-04-09 (×2): qty 1

## 2018-04-09 MED ORDER — DEXAMETHASONE SODIUM PHOSPHATE 10 MG/ML IJ SOLN
INTRAMUSCULAR | Status: AC
  Filled 2018-04-09: qty 1

## 2018-04-09 MED ORDER — ONDANSETRON HCL 4 MG/2ML IJ SOLN: 4.0000 mg | Freq: Four times a day (QID) | INTRAMUSCULAR | Status: DC | PRN

## 2018-04-09 MED ORDER — ACETAMINOPHEN 10 MG/ML IV SOLN: 1000.0000 mg | Freq: Once | INTRAVENOUS | Status: DC | PRN

## 2018-04-09 MED ORDER — BUPIVACAINE-EPINEPHRINE (PF) 0.25% -1:200000 IJ SOLN
INTRAMUSCULAR | Status: DC | PRN
  Administered 2018-04-09: 30 mL

## 2018-04-09 MED ORDER — BISACODYL 10 MG RE SUPP: 10.0000 mg | Freq: Every day | RECTAL | Status: DC | PRN

## 2018-04-09 MED ORDER — ONDANSETRON HCL 4 MG/2ML IJ SOLN
INTRAMUSCULAR | Status: AC
  Filled 2018-04-09: qty 2

## 2018-04-09 MED ORDER — CLONIDINE HCL (ANALGESIA) 100 MCG/ML EP SOLN
EPIDURAL | Status: DC | PRN
  Administered 2018-04-09: 100 ug

## 2018-04-09 MED ORDER — CLONIDINE HCL (ANALGESIA) 100 MCG/ML EP SOLN
EPIDURAL | Status: AC
  Filled 2018-04-09: qty 10

## 2018-04-09 MED ORDER — DOCUSATE SODIUM 100 MG PO CAPS: 100.0000 mg | ORAL_CAPSULE | Freq: Two times a day (BID) | ORAL | 0 refills | Status: DC

## 2018-04-09 MED ORDER — HYDROCODONE-ACETAMINOPHEN 7.5-325 MG PO TABS
1.0000 | ORAL_TABLET | ORAL | Status: DC | PRN
  Administered 2018-04-09 – 2018-04-10 (×3): 2 via ORAL
  Filled 2018-04-09 (×3): qty 2

## 2018-04-09 MED ORDER — PHENYLEPHRINE HCL 10 MG/ML IJ SOLN
INTRAMUSCULAR | Status: AC
  Filled 2018-04-09: qty 1

## 2018-04-09 MED ORDER — ONDANSETRON HCL 4 MG/2ML IJ SOLN
INTRAMUSCULAR | Status: DC | PRN
  Administered 2018-04-09: 4 mg via INTRAVENOUS

## 2018-04-09 MED ORDER — SODIUM CHLORIDE 0.9 % IV SOLN
INTRAVENOUS | Status: DC
  Administered 2018-04-09: 18:00:00 via INTRAVENOUS

## 2018-04-09 MED ORDER — SODIUM CHLORIDE (PF) 0.9 % IJ SOLN
INTRAMUSCULAR | Status: DC | PRN
  Administered 2018-04-09: 30 mL

## 2018-04-09 MED ORDER — ESCITALOPRAM OXALATE 20 MG PO TABS
20.0000 mg | ORAL_TABLET | Freq: Every day | ORAL | Status: DC
  Administered 2018-04-09: 20 mg via ORAL
  Filled 2018-04-09: qty 1

## 2018-04-09 MED ORDER — SODIUM CHLORIDE (PF) 0.9 % IJ SOLN
INTRAMUSCULAR | Status: AC
  Filled 2018-04-09: qty 50

## 2018-04-09 MED ORDER — TRANEXAMIC ACID-NACL 1000-0.7 MG/100ML-% IV SOLN
1000.0000 mg | INTRAVENOUS | Status: AC
  Administered 2018-04-09: 1000 mg via INTRAVENOUS
  Filled 2018-04-09: qty 100

## 2018-04-09 MED ORDER — ALUM & MAG HYDROXIDE-SIMETH 200-200-20 MG/5ML PO SUSP: 15.0000 mL | ORAL | Status: DC | PRN

## 2018-04-09 MED ORDER — PROPOFOL 10 MG/ML IV BOLUS
INTRAVENOUS | Status: DC | PRN
  Administered 2018-04-09: 20 mg via INTRAVENOUS

## 2018-04-09 MED ORDER — POLYETHYLENE GLYCOL 3350 17 G PO PACK
17.0000 g | PACK | Freq: Two times a day (BID) | ORAL | Status: DC
  Administered 2018-04-09 – 2018-04-10 (×2): 17 g via ORAL
  Filled 2018-04-09 (×2): qty 1

## 2018-04-09 MED ORDER — METHOCARBAMOL 500 MG PO TABS
500.0000 mg | ORAL_TABLET | Freq: Four times a day (QID) | ORAL | Status: DC | PRN
  Administered 2018-04-09 – 2018-04-10 (×2): 500 mg via ORAL
  Filled 2018-04-09 (×2): qty 1

## 2018-04-09 MED ORDER — METOPROLOL TARTRATE 25 MG PO TABS
25.0000 mg | ORAL_TABLET | Freq: Two times a day (BID) | ORAL | Status: DC
  Administered 2018-04-10: 25 mg via ORAL
  Filled 2018-04-09 (×2): qty 1

## 2018-04-09 MED ORDER — ONDANSETRON HCL 4 MG/2ML IJ SOLN: 4.0000 mg | Freq: Once | INTRAMUSCULAR | Status: DC | PRN

## 2018-04-09 MED ORDER — FERROUS SULFATE 325 (65 FE) MG PO TABS
325.0000 mg | ORAL_TABLET | Freq: Two times a day (BID) | ORAL | Status: DC
  Administered 2018-04-10: 325 mg via ORAL
  Filled 2018-04-09: qty 1

## 2018-04-09 MED ORDER — CEFAZOLIN SODIUM-DEXTROSE 2-4 GM/100ML-% IV SOLN
2.0000 g | INTRAVENOUS | Status: AC
  Administered 2018-04-09: 2 g via INTRAVENOUS
  Filled 2018-04-09: qty 100

## 2018-04-09 MED ORDER — METHOCARBAMOL 500 MG PO TABS: 500.0000 mg | ORAL_TABLET | Freq: Four times a day (QID) | ORAL | 0 refills | Status: DC | PRN

## 2018-04-09 MED ORDER — SCOPOLAMINE 1 MG/3DAYS TD PT72: 1.0000 | MEDICATED_PATCH | TRANSDERMAL | Status: DC

## 2018-04-09 MED ORDER — DIPHENHYDRAMINE HCL 12.5 MG/5ML PO ELIX: 12.5000 mg | ORAL_SOLUTION | ORAL | Status: DC | PRN

## 2018-04-09 MED ORDER — BUPIVACAINE HCL (PF) 0.25 % IJ SOLN
INTRAMUSCULAR | Status: AC
  Filled 2018-04-09: qty 30

## 2018-04-09 MED ORDER — BUPROPION HCL ER (XL) 150 MG PO TB24
150.0000 mg | ORAL_TABLET | Freq: Every day | ORAL | Status: DC
  Administered 2018-04-10: 150 mg via ORAL
  Filled 2018-04-09: qty 1

## 2018-04-09 MED ORDER — DEXAMETHASONE SODIUM PHOSPHATE 10 MG/ML IJ SOLN
10.0000 mg | Freq: Once | INTRAMUSCULAR | Status: AC
  Administered 2018-04-09: 10 mg via INTRAVENOUS

## 2018-04-09 MED ORDER — PHENYLEPHRINE 40 MCG/ML (10ML) SYRINGE FOR IV PUSH (FOR BLOOD PRESSURE SUPPORT)
PREFILLED_SYRINGE | INTRAVENOUS | Status: DC | PRN
  Administered 2018-04-09 (×5): 80 ug via INTRAVENOUS

## 2018-04-09 MED ORDER — LORAZEPAM 1 MG PO TABS: 1.0000 mg | ORAL_TABLET | Freq: Every day | ORAL | Status: DC | PRN

## 2018-04-09 MED ORDER — ASPIRIN 81 MG PO CHEW: 81.0000 mg | CHEWABLE_TABLET | Freq: Two times a day (BID) | ORAL | 0 refills | Status: AC

## 2018-04-09 MED ORDER — DEXAMETHASONE SODIUM PHOSPHATE 10 MG/ML IJ SOLN
10.0000 mg | Freq: Once | INTRAMUSCULAR | Status: AC
  Administered 2018-04-10: 10 mg via INTRAVENOUS
  Filled 2018-04-09: qty 1

## 2018-04-09 MED ORDER — TRAZODONE HCL 50 MG PO TABS
50.0000 mg | ORAL_TABLET | Freq: Every evening | ORAL | Status: DC | PRN
  Administered 2018-04-10: 50 mg via ORAL
  Filled 2018-04-09: qty 1

## 2018-04-09 MED ORDER — ACETAMINOPHEN 325 MG PO TABS: 325.0000 mg | ORAL_TABLET | Freq: Four times a day (QID) | ORAL | Status: DC | PRN

## 2018-04-09 MED ORDER — METOCLOPRAMIDE HCL 5 MG PO TABS: 5.0000 mg | ORAL_TABLET | Freq: Three times a day (TID) | ORAL | Status: DC | PRN

## 2018-04-09 MED ORDER — LACTATED RINGERS IV SOLN
INTRAVENOUS | Status: DC
  Administered 2018-04-09 (×2): via INTRAVENOUS

## 2018-04-09 MED ORDER — SODIUM CHLORIDE 0.9 % IR SOLN
Status: DC | PRN
  Administered 2018-04-09 (×2): 1000 mL

## 2018-04-09 MED ORDER — METOCLOPRAMIDE HCL 5 MG/ML IJ SOLN: 5.0000 mg | Freq: Three times a day (TID) | INTRAMUSCULAR | Status: DC | PRN

## 2018-04-09 MED ORDER — TRANEXAMIC ACID-NACL 1000-0.7 MG/100ML-% IV SOLN
1000.0000 mg | Freq: Once | INTRAVENOUS | Status: AC
  Administered 2018-04-09: 1000 mg via INTRAVENOUS
  Filled 2018-04-09: qty 100

## 2018-04-09 MED ORDER — ASPIRIN 81 MG PO CHEW
81.0000 mg | CHEWABLE_TABLET | Freq: Two times a day (BID) | ORAL | Status: DC
  Administered 2018-04-09 – 2018-04-10 (×2): 81 mg via ORAL
  Filled 2018-04-09 (×2): qty 1

## 2018-04-09 MED ORDER — METHOCARBAMOL 500 MG IVPB - SIMPLE MED
500.0000 mg | Freq: Four times a day (QID) | INTRAVENOUS | Status: DC | PRN
  Filled 2018-04-09: qty 50

## 2018-04-09 MED ORDER — FENTANYL CITRATE (PF) 100 MCG/2ML IJ SOLN
50.0000 ug | Freq: Once | INTRAMUSCULAR | Status: AC
  Administered 2018-04-09: 50 ug via INTRAVENOUS
  Filled 2018-04-09: qty 2

## 2018-04-09 MED ORDER — HYDROCODONE-ACETAMINOPHEN 7.5-325 MG PO TABS: 1.0000 | ORAL_TABLET | ORAL | 0 refills | Status: DC | PRN

## 2018-04-09 MED ORDER — SODIUM CHLORIDE 0.9 % IV SOLN
INTRAVENOUS | Status: DC | PRN
  Administered 2018-04-09: 25 ug/min via INTRAVENOUS

## 2018-04-09 MED ORDER — ROPIVACAINE HCL 5 MG/ML IJ SOLN
INTRAMUSCULAR | Status: DC | PRN
  Administered 2018-04-09: 30 mL

## 2018-04-09 SURGICAL SUPPLY — 61 items
ADH SKN CLS APL DERMABOND .7 (GAUZE/BANDAGES/DRESSINGS) ×1
ATTUNE MED ANAT PAT 35 KNEE (Knees) ×1 IMPLANT
ATTUNE PSFEM RTSZ4 NARCEM KNEE (Femur) ×1 IMPLANT
ATTUNE PSRP INSR SZ4 5 KNEE (Insert) ×1 IMPLANT
BAG SPEC THK2 15X12 ZIP CLS (MISCELLANEOUS)
BAG ZIPLOCK 12X15 (MISCELLANEOUS) IMPLANT
BANDAGE ACE 6X5 VEL STRL LF (GAUZE/BANDAGES/DRESSINGS) ×2 IMPLANT
BASEPLATE TIBIAL ROTATING SZ 4 (Knees) ×1 IMPLANT
BLADE SAW SGTL 11.0X1.19X90.0M (BLADE) IMPLANT
BLADE SAW SGTL 13.0X1.19X90.0M (BLADE) ×2 IMPLANT
BLADE SURG SZ10 CARB STEEL (BLADE) ×4 IMPLANT
BOWL SMART MIX CTS (DISPOSABLE) ×2 IMPLANT
BSPLAT TIB 4 CMNT ROT PLAT STR (Knees) ×1 IMPLANT
CEMENT HV SMART SET (Cement) ×2 IMPLANT
CHLORAPREP W/TINT 26ML (MISCELLANEOUS) ×2 IMPLANT
COVER SURGICAL LIGHT HANDLE (MISCELLANEOUS) ×2 IMPLANT
COVER WAND RF STERILE (DRAPES) IMPLANT
CUFF TOURN SGL QUICK 34 (TOURNIQUET CUFF) ×2
CUFF TRNQT CYL 34X4.125X (TOURNIQUET CUFF) ×1 IMPLANT
DECANTER SPIKE VIAL GLASS SM (MISCELLANEOUS) ×4 IMPLANT
DERMABOND ADVANCED (GAUZE/BANDAGES/DRESSINGS) ×1
DERMABOND ADVANCED .7 DNX12 (GAUZE/BANDAGES/DRESSINGS) ×1 IMPLANT
DRAPE U-SHAPE 47X51 STRL (DRAPES) ×2 IMPLANT
DRESSING AQUACEL AG SP 3.5X10 (GAUZE/BANDAGES/DRESSINGS) ×1 IMPLANT
DRSG AQUACEL AG SP 3.5X10 (GAUZE/BANDAGES/DRESSINGS) ×2
DURAPREP 26ML APPLICATOR (WOUND CARE) ×2 IMPLANT
ELECT REM PT RETURN 15FT ADLT (MISCELLANEOUS) ×2 IMPLANT
GLOVE BIO SURGEON STRL SZ 6 (GLOVE) ×2 IMPLANT
GLOVE BIOGEL PI IND STRL 6.5 (GLOVE) ×1 IMPLANT
GLOVE BIOGEL PI IND STRL 7.5 (GLOVE) ×1 IMPLANT
GLOVE BIOGEL PI IND STRL 8.5 (GLOVE) ×1 IMPLANT
GLOVE BIOGEL PI INDICATOR 6.5 (GLOVE) ×1
GLOVE BIOGEL PI INDICATOR 7.5 (GLOVE) ×1
GLOVE BIOGEL PI INDICATOR 8.5 (GLOVE) ×1
GLOVE ECLIPSE 8.0 STRL XLNG CF (GLOVE) ×2 IMPLANT
GLOVE ORTHO TXT STRL SZ7.5 (GLOVE) ×2 IMPLANT
GOWN STRL REUS W/ TWL LRG LVL3 (GOWN DISPOSABLE) ×1 IMPLANT
GOWN STRL REUS W/TWL 2XL LVL3 (GOWN DISPOSABLE) ×2 IMPLANT
GOWN STRL REUS W/TWL LRG LVL3 (GOWN DISPOSABLE) ×4 IMPLANT
HANDPIECE INTERPULSE COAX TIP (DISPOSABLE) ×2
HOLDER FOLEY CATH W/STRAP (MISCELLANEOUS) ×1 IMPLANT
MANIFOLD NEPTUNE II (INSTRUMENTS) ×2 IMPLANT
NDL SAFETY ECLIPSE 18X1.5 (NEEDLE) IMPLANT
NEEDLE HYPO 18GX1.5 SHARP (NEEDLE) ×2
NS IRRIG 1000ML POUR BTL (IV SOLUTION) ×2 IMPLANT
PACK TOTAL KNEE CUSTOM (KITS) ×2 IMPLANT
PIN STEINMAN FIXATION KNEE (PIN) ×1 IMPLANT
PROTECTOR NERVE ULNAR (MISCELLANEOUS) ×2 IMPLANT
SET HNDPC FAN SPRY TIP SCT (DISPOSABLE) ×1 IMPLANT
SET PAD KNEE POSITIONER (MISCELLANEOUS) ×2 IMPLANT
SUT MNCRL AB 4-0 PS2 18 (SUTURE) ×2 IMPLANT
SUT STRATAFIX PDS+ 0 24IN (SUTURE) ×2 IMPLANT
SUT VIC AB 1 CT1 36 (SUTURE) ×2 IMPLANT
SUT VIC AB 2-0 CT1 27 (SUTURE) ×6
SUT VIC AB 2-0 CT1 TAPERPNT 27 (SUTURE) ×3 IMPLANT
SYR 3ML LL SCALE MARK (SYRINGE) ×3 IMPLANT
TRAY FOLEY BAG SILVER LF 14FR (CATHETERS) ×1 IMPLANT
TRAY FOLEY MTR SLVR 16FR STAT (SET/KITS/TRAYS/PACK) ×1 IMPLANT
WATER STERILE IRR 1000ML POUR (IV SOLUTION) ×4 IMPLANT
WRAP KNEE MAXI GEL POST OP (GAUZE/BANDAGES/DRESSINGS) ×2 IMPLANT
YANKAUER SUCT BULB TIP 10FT TU (MISCELLANEOUS) ×2 IMPLANT

## 2018-04-09 NOTE — Op Note (Signed)
NAME:  [name]                      MEDICAL RECORD NO.:  [id number]                             FACILITY:  [facility]      PHYSICIAN:  Madlyn Frankel. Charlann Boxer, M.D.  DATE OF BIRTH:  [birth year]      DATE OF PROCEDURE:  04/09/2018                                     OPERATIVE REPORT         PREOPERATIVE DIAGNOSIS:  Right knee osteoarthritis.      POSTOPERATIVE DIAGNOSIS:  Right knee osteoarthritis.      FINDINGS:  The patient was noted to have complete loss of cartilage and   bone-on-bone arthritis with associated osteophytes in the medial and patellofemoral compartments of   the knee with a significant synovitis and associated effusion.  The patient had failed months of conservative treatment including medications, injection therapy, activity modification.     PROCEDURE:  Right total knee replacement.      COMPONENTS USED:  DePuy Attune rotating platform posterior stabilized knee   system, a size 4n femur, 4 tibia, size 5 mm PS AOX insert, and 35 anatomic patellar   button.      SURGEON:  Madlyn Frankel. Charlann Boxer, M.D.      ASSISTANT:  Dennie Bible, PA-C.      ANESTHESIA:  Regional and Spinal.      SPECIMENS:  None.      COMPLICATION:  None.      DRAINS:  None.  EBL: <100cc      TOURNIQUET TIME:   Total Tourniquet Time Documented: Thigh (Right) - 32 minutes Total: Thigh (Right) - 32 minutes  .      The patient was stable to the recovery room.      INDICATION FOR PROCEDURE:  [name] is a 63 y.o. female patient of   mine.  The patient had been seen, evaluated, and treated for months conservatively in the   office with medication, activity modification, and injections.  The patient had   radiographic changes of bone-on-bone arthritis with endplate sclerosis and osteophytes noted.  Based on the radiographic changes and failed conservative measures, the patient   decided to proceed with definitive treatment, total knee replacement.  Risks of infection, DVT, component  failure, need for revision surgery, neurovascular injury were reviewed in the office setting.  The postop course was reviewed stressing the efforts to maximize post-operative satisfaction and function.  Consent was obtained for benefit of pain   relief.      PROCEDURE IN DETAIL:  The patient was brought to the operative theater.   Once adequate anesthesia, preoperative antibiotics, 2 gm of Ancef,1 gm of Tranexamic Acid, and 10 mg of Decadron administered, the patient was positioned supine with a right thigh tourniquet placed.  The  right lower extremity was prepped and draped in sterile fashion.  A time-   out was performed identifying the patient, planned procedure, and the appropriate extremity.      The right lower extremity was placed in the [facility] leg holder.  The leg was   exsanguinated, tourniquet elevated to 250 mmHg.  A midline incision was   made  followed by median parapatellar arthrotomy.  Following initial   exposure, attention was first directed to the patella.  Precut   measurement was noted to be 20 mm.  I resected down to 13 mm and used a   35 anatomic patellar button to restore patellar height as well as cover the cut surface.      The lug holes were drilled and a metal shim was placed to protect the   patella from retractors and saw blade during the procedure.      At this point, attention was now directed to the femur.  The femoral   canal was opened with a drill, irrigated to try to prevent fat emboli.  An   intramedullary rod was passed at 3 degrees valgus, 9 mm of bone was   resected off the distal femur.  Following this resection, the tibia was   subluxated anteriorly.  Using the extramedullary guide, 2 mm of bone was resected off   the proximal medial tibia.  We confirmed the gap would be   stable medially and laterally with a size 5 spacer block as well as confirmed that the tibial cut was perpendicular in the coronal plane, checking with an alignment rod.      Once  this was done, I sized the femur to be a size 4 in the anterior-   posterior dimension, chose a narrow component based on medial and   lateral dimension.  The size 4 rotation block was then pinned in   position anterior referenced using the C-clamp to set rotation.  The   anterior, posterior, and  chamfer cuts were made without difficulty nor   notching making certain that I was along the anterior cortex to help   with flexion gap stability.      The final box cut was made off the lateral aspect of distal femur.      At this point, the tibia was sized to be a size 4.  The size 4 tray was   then pinned in position through the medial third of the tubercle,   drilled, and keel punched.  Trial reduction was now carried with a 4 femur,  4 tibia, a size 5 mm PS insert, and the 35 anatomic patella botton.  The knee was brought to full extension with good flexion stability with the patella   tracking through the trochlea without application of pressure.  Given   all these findings the trial components removed.  Final components were   opened and cement was mixed.  The knee was irrigated with normal saline solution and pulse lavage.  The synovial lining was   then injected with 30 cc of 0.25% Marcaine with epinephrine, 1 cc of Toradol and 30 cc of NS for a total of 61 cc.     Final implants were then cemented onto cleaned and dried cut surfaces of bone with the knee brought to extension with a size 5 mm PS trial insert.      Once the cement had fully cured, excess cement was removed   throughout the knee.  I confirmed that I was satisfied with the range of   motion and stability, and the final size 5 mm PS AOX insert was chosen.  It was   placed into the knee.      The tourniquet had been let down at 32 minutes.  No significant   hemostasis was required.  The extensor mechanism was then reapproximated using #1 Vicryl and #  1 Stratafix sutures with the knee   in flexion.  The   remaining wound was  closed with 2-0 Vicryl and running 4-0 Monocryl.   The knee was cleaned, dried, dressed sterilely using Dermabond and   Aquacel dressing.  The patient was then   brought to recovery room in stable condition, tolerating the procedure   well.   Please note that Physician Assistant, Dennie Bible, PA-C was present for the entirety of the case, and was utilized for pre-operative positioning, peri-operative retractor management, general facilitation of the procedure and for primary wound closure at the end of the case.              Madlyn Frankel Charlann Boxer, M.D.    04/09/2018 4:43 PM

## 2018-04-09 NOTE — Anesthesia Procedure Notes (Signed)
Spinal  Patient location during procedure: OR Start time: 04/09/2018 2:49 PM End time: 04/09/2018 2:54 PM Reason for block: at surgeon's request Staffing Anesthesiologist: Barnet Glasgow, MD Resident/CRNA: West Pugh, CRNA Performed: resident/CRNA  Preanesthetic Checklist Completed: patient identified, site marked, surgical consent, pre-op evaluation, timeout performed, IV checked, risks and benefits discussed and monitors and equipment checked Spinal Block Patient position: sitting Prep: DuraPrep Patient monitoring: heart rate, continuous pulse ox and blood pressure Approach: midline Location: L3-4 Injection technique: single-shot Needle Needle type: Pencan  Needle gauge: 24 G Needle length: 9 cm Assessment Sensory level: T6 Additional Notes Expiration of kit checked and confirmed. Patient tolerated procedure well,without complications with noted clear CSF. Loss of motor and sensory on exam post injection. Dr Valma Cava present throughout procedure.

## 2018-04-09 NOTE — Discharge Instructions (Signed)

## 2018-04-09 NOTE — Transfer of Care (Signed)
Immediate Anesthesia Transfer of Care Note  Patient: [name]  Procedure(s) Performed: TOTAL KNEE ARTHROPLASTY (Right Knee)  Patient Location: PACU  Anesthesia Type:Spinal and MAC combined with regional for post-op pain  Level of Consciousness: awake, alert , oriented and patient cooperative  Airway & Oxygen Therapy: Patient Spontanous Breathing and Patient connected to face mask oxygen  Post-op Assessment: Report given to RN and Post -op Vital signs reviewed and stable  Post vital signs: Reviewed and stable  Last Vitals:  Vitals Value Taken Time  BP 109/65 04/09/2018  4:51 PM  Temp    Pulse 96 04/09/2018  4:53 PM  Resp 14 04/09/2018  4:54 PM  SpO2 93 % 04/09/2018  4:53 PM  Vitals shown include unvalidated device data.  Last Pain:  Vitals:   04/09/18 1308  TempSrc: Oral         Complications: No apparent anesthesia complications

## 2018-04-09 NOTE — Progress Notes (Signed)
AssistedDr. Houser with right, ultrasound guided, adductor canal block. Side rails up, monitors on throughout procedure. See vital signs in flow sheet. Tolerated Procedure well.  

## 2018-04-09 NOTE — Anesthesia Procedure Notes (Signed)
Procedure Name: MAC Date/Time: 04/09/2018 2:47 PM Performed by: West Pugh, CRNA Pre-anesthesia Checklist: Patient identified, Emergency Drugs available, Suction available, Patient being monitored and Timeout performed Patient Re-evaluated:Patient Re-evaluated prior to induction Oxygen Delivery Method: Simple face mask Preoxygenation: Pre-oxygenation with 100% oxygen Induction Type: IV induction Placement Confirmation: positive ETCO2 Dental Injury: Teeth and Oropharynx as per pre-operative assessment

## 2018-04-09 NOTE — Anesthesia Procedure Notes (Addendum)
Anesthesia Regional Block: Adductor canal block   Pre-Anesthetic Checklist: ,, timeout performed, Correct Patient, Correct Site, Correct Laterality, Correct Procedure, Correct Position, site marked, Risks and benefits discussed,  Surgical consent,  Pre-op evaluation,  At surgeon's request and post-op pain management  Laterality: Lower and Right  Prep: chloraprep       Needles:  Injection technique: Single-shot  Needle Type: Echogenic Needle     Needle Length: 9cm  Needle Gauge: 22     Additional Needles:   Procedures:,,,, ultrasound used (permanent image in chart),,,,  Narrative:  Start time: 04/09/2018 2:05 PM End time: 04/09/2018 2:12 PM Injection made incrementally with aspirations every 5 mL.  Performed by: Personally  Anesthesiologist: Trevor Iha, MD  Additional Notes: Block assessed prior to surgery. Pt tolerated procedure well.

## 2018-04-09 NOTE — Plan of Care (Signed)
  Problem: Education: Goal: Knowledge of General Education information will improve Description: Including pain rating scale, medication(s)/side effects and non-pharmacologic comfort measures Outcome: Progressing   Problem: Clinical Measurements: Goal: Will remain free from infection Outcome: Progressing   Problem: Nutrition: Goal: Adequate nutrition will be maintained Outcome: Progressing   Problem: Coping: Goal: Level of anxiety will decrease Outcome: Progressing   Problem: Pain Managment: Goal: General experience of comfort will improve Outcome: Progressing   

## 2018-04-09 NOTE — Anesthesia Postprocedure Evaluation (Signed)
Anesthesia Post Note  Patient: [name]  Procedure(s) Performed: TOTAL KNEE ARTHROPLASTY (Right Knee)     Patient location during evaluation: Nursing Unit Anesthesia Type: Spinal Level of consciousness: oriented and awake and alert Pain management: pain level controlled Vital Signs Assessment: post-procedure vital signs reviewed and stable Respiratory status: spontaneous breathing and respiratory function stable Cardiovascular status: blood pressure returned to baseline and stable Postop Assessment: no headache, no backache, no apparent nausea or vomiting and patient able to bend at knees Anesthetic complications: no    Last Vitals:  Vitals:   04/09/18 1700 04/09/18 1715  BP: 97/65 99/69  Pulse: 76 66  Resp: 20 (!) 24  Temp:    SpO2: 92% 97%    Last Pain:  Vitals:   04/09/18 1715  TempSrc:   PainSc: 0-No pain    LLE Motor Response: Purposeful movement (04/09/18 1715)   RLE Motor Response: Purposeful movement (04/09/18 1715)   L Sensory Level: L5-Outer lower leg, top of foot, great toe (04/09/18 1715) R Sensory Level: L5-Outer lower leg, top of foot, great toe (04/09/18 1715)  Trevor Iha

## 2018-04-09 NOTE — Interval H&P Note (Signed)
History and Physical Interval Note:  04/09/2018 1:30 PM  [name]  has presented today for surgery, with the diagnosis of Right knee osteoarthritis  The various methods of treatment have been discussed with the patient and family. After consideration of risks, benefits and other options for treatment, the patient has consented to  Procedure(s) with comments: TOTAL KNEE ARTHROPLASTY (Right) - 70 mins as a surgical intervention .  The patient's history has been reviewed, patient examined, no change in status, stable for surgery.  I have reviewed the patient's chart and labs.  Questions were answered to the patient's satisfaction.     [name]

## 2018-04-10 ENCOUNTER — Encounter (HOSPITAL_COMMUNITY): Payer: Self-pay | Admitting: Orthopedic Surgery

## 2018-04-10 DIAGNOSIS — E669 Obesity, unspecified: Secondary | ICD-10-CM | POA: Diagnosis present

## 2018-04-10 DIAGNOSIS — M1711 Unilateral primary osteoarthritis, right knee: Secondary | ICD-10-CM | POA: Diagnosis not present

## 2018-04-10 LAB — BASIC METABOLIC PANEL
Anion gap: 7 (ref 5–15)
BUN: 11 mg/dL (ref 8–23)
CO2: 23 mmol/L (ref 22–32)
Calcium: 8.7 mg/dL — ABNORMAL LOW (ref 8.9–10.3)
Chloride: 106 mmol/L (ref 98–111)
Creatinine, Ser: 0.65 mg/dL (ref 0.44–1.00)
GFR calc Af Amer: >60 mL/min (ref >60)
GFR calc non Af Amer: >60 mL/min (ref >60)
Glucose, Bld: 138 mg/dL — ABNORMAL HIGH (ref 70–99)
Potassium: 4.1 mmol/L (ref 3.5–5.1)
SODIUM: 136 mmol/L (ref 135–145)

## 2018-04-10 LAB — CBC
HCT: 31.4 % — ABNORMAL LOW (ref 36.0–46.0)
Hemoglobin: 10.2 g/dL — ABNORMAL LOW (ref 12.0–15.0)
MCH: 30.9 pg (ref 26.0–34.0)
MCHC: 32.5 g/dL (ref 30.0–36.0)
MCV: 95.2 fL (ref 80.0–100.0)
Platelets: 227 10*3/uL (ref 150–400)
RBC: 3.3 MIL/uL — ABNORMAL LOW (ref 3.87–5.11)
RDW: 12.1 % (ref 11.5–15.5)
WBC: 8.8 10*3/uL (ref 4.0–10.5)
nRBC: 0 % (ref 0.0–0.2)

## 2018-04-10 NOTE — Care Management Note (Signed)
Case Management Note  Patient Details  Name: SHERLEEN PANCIERA MRN: [id number] Date of Birth: [birth year]  Subjective/Objective:                  DISCHARGE PLANNING  Action/Plan: HHC-OOPT-SCHEDULED DME HAS 3 IN1 AND Levan Hurst Expected Discharge Date:  04/10/18               Expected Discharge Plan:  Home/Self Care  In-House Referral:     Discharge planning Services  CM Consult  Post Acute Care Choice:    Choice offered to:     DME Arranged:    DME Agency:     HH Arranged:    HH Agency:     Status of Service:  Completed, signed off  If discussed at [employer] of Stay Meetings, dates discussed:    Additional Comments:  Golda Acre, RN 04/10/2018, 9:49 AM

## 2018-04-10 NOTE — Progress Notes (Signed)
     Subjective: 1 Day Post-Op Procedure(s) (LRB): TOTAL KNEE ARTHROPLASTY (Right)   Patient reports pain as mild, pain controlled.  No events throughout the night.  States that this knee is doing well and since she had the other knee not too long ago, she knows what to do with this knee.  She will start PT on Monday and I will giver her a script to bring with her.    Patient's anticipated LOS is less than 2 midnights, meeting these requirements: - Younger than 22 - Lives within 1 hour of care - Has a competent adult at home to recover with post-op recover - NO history of  - Chronic pain requiring opiods  - Diabetes  - Coronary Artery Disease  - Heart failure  - Heart attack  - Stroke  - DVT/VTE  - Cardiac arrhythmia  - Respiratory Failure/COPD  - Renal failure  - Anemia  - Advanced Liver disease   Objective:   VITALS:   Vitals:   04/10/18 0116 04/10/18 0524  BP: 120/77 131/76  Pulse: 74 71  Resp: 16 16  Temp: (!) 97.4 F (36.3 C) 97.7 F (36.5 C)  SpO2: 94% 99%    Dorsiflexion/Plantar flexion intact Incision: dressing C/D/I No cellulitis present Compartment soft  LABS Recent Labs    04/10/18 0519  HGB 10.2*  HCT 31.4*  WBC 8.8  PLT 227    Recent Labs    04/10/18 0519  NA 136  K 4.1  BUN 11  CREATININE 0.65  GLUCOSE 138*     Assessment/Plan: 1 Day Post-Op Procedure(s) (LRB): TOTAL KNEE ARTHROPLASTY (Right) Rx for PT written Foley cath d/c'ed Advance diet Up with therapy D/C IV fluids Discharge home  Follow up in 2 weeks at [facility] Follow up with OLIN,Huzaifa Viney D in 2 weeks.  Contact information:  [facility]) [address], [address] [place] [address] [phone]    Obese (BMI 30-39.9) Estimated body mass index is 30.08 kg/m as calculated from the following:   Height as of this encounter: 5\' 1"  (1.549 m).   Weight as of this encounter: 72.2  kg. Patient also counseled that weight may inhibit the healing process Patient counseled that losing weight will help with future health issues         Anastasio Auerbach. Abdirahman Chittum   PAC  04/10/2018, 8:46 AM

## 2018-04-10 NOTE — Progress Notes (Signed)
Physical Therapy Treatment Patient Details Name: [name] MRN: [id number] DOB: 1955-08-19 Today's Date: 04/10/2018    History of Present Illness Pt s/p R TKR and with hx of L TKR 1/20    PT Comments    Pt reviewed/performed home therex program with assist.  Pt states very familiar with and has kept written instruction from TKR this past January   Follow Up Recommendations  Follow surgeon's recommendation for DC plan and follow-up therapies     Equipment Recommendations  None recommended by PT    Recommendations for Other Services       Precautions / Restrictions Precautions Precautions: Knee;Fall Precaution Comments: pt states she kept knee therex handout from recent L TKR Restrictions Weight Bearing Restrictions: No LLE Weight Bearing: Weight bearing as tolerated    Mobility  Bed Mobility Overal bed mobility: Needs Assistance Bed Mobility: Supine to Sit     Supine to sit: Min guard;Supervision     General bed mobility comments: no assist needed  Transfers Overall transfer level: Needs assistance Equipment used: Rolling walker (2 wheeled) Transfers: Sit to/from Stand Sit to Stand: Min guard;Supervision         General transfer comment: VCs hand placement  Ambulation/Gait Ambulation/Gait assistance: Min Emergency planning/management officer (Feet): 100 Feet Assistive device: Rolling walker (2 wheeled) Gait Pattern/deviations: Step-to pattern Gait velocity: decr   General Gait Details: steady with RW, no buckling, VCs to lift head   Stairs Stairs: Yes Stairs assistance: Min guard Stair Management: Two rails;Step to pattern;Forwards Number of Stairs: 4 General stair comments: VCs sequencing, min/guard safety   Wheelchair Mobility    Modified Rankin (Stroke Patients Only)       Balance Overall balance assessment: Mild deficits observed, not formally tested                                          Cognition  Arousal/Alertness: Awake/alert Behavior During Therapy: WFL for tasks assessed/performed Overall Cognitive Status: Within Functional Limits for tasks assessed                                        Exercises Total Joint Exercises Ankle Circles/Pumps: AROM;Both;15 reps Quad Sets: AROM;Left;10 reps Heel Slides: AAROM;Left;Supine;15 reps Hip ABduction/ADduction: AROM;Left;10 reps;Supine Straight Leg Raises: AROM;Left;10 reps;Supine Long Arc Quad: Left;10 reps;Seated;AAROM;AROM Knee Flexion: AROM;Left;AAROM;10 reps;Seated    General Comments        Pertinent Vitals/Pain Pain Assessment: 0-10 Pain Score: 6  Pain Location: R knee/thigh Pain Descriptors / Indicators: Sore Pain Intervention(s): Limited activity within patient's tolerance;Monitored during session;Premedicated before session;Ice applied    Home Living Family/patient expects to be discharged to:: Private residence Living Arrangements: Other relatives Available Help at Discharge: Family Type of Home: House Home Access: Stairs to enter Entrance Stairs-Rails: Right;Left Home Layout: One level Home Equipment: Environmental consultant - 2 wheels      Prior Function Level of Independence: Independent      Comments: works full time for [employer]   PT Goals (current goals can now be found in the care plan section) Acute Rehab PT Goals Patient Stated Goal: go to yard sales, walk at a school track PT Goal Formulation: With patient Time For Goal Achievement: 04/17/18 Potential to Achieve Goals: Good Progress towards PT goals: Progressing toward goals    Frequency  7X/week      PT Plan Current plan remains appropriate    Co-evaluation              AM-PAC PT "6 Clicks" Mobility   Outcome Measure  Help needed turning from your back to your side while in a flat bed without using bedrails?: None Help needed moving from lying on your back to sitting on the side of a flat bed without using bedrails?:  None Help needed moving to and from a bed to a chair (including a wheelchair)?: A Little Help needed standing up from a chair using your arms (e.g., wheelchair or bedside chair)?: A Little Help needed to walk in hospital room?: A Little Help needed climbing 3-5 steps with a railing? : A Little 6 Click Score: 20    End of Session Equipment Utilized During Treatment: Gait belt Activity Tolerance: Patient tolerated treatment well Patient left: in chair;with call bell/phone within reach;with chair alarm set Nurse Communication: Mobility status PT Visit Diagnosis: Muscle weakness (generalized) (M62.81);Difficulty in walking, not elsewhere classified (R26.2);Pain Pain - Right/Left: Left Pain - part of body: Knee     Time: 5790-3833 PT Time Calculation (min) (ACUTE ONLY): 16 min  Charges:  $Therapeutic Exercise: 8-22 mins                     Mauro Kaufmann PT Acute Rehabilitation Services Pager [phone] Office [phone]    Exavior Kimmons 04/10/2018, 12:49 PM

## 2018-04-10 NOTE — Evaluation (Signed)
Physical Therapy Evaluation Patient Details Name: [name] MRN: [id number] DOB: 05-20-1955 Today's Date: 04/10/2018   History of Present Illness  Pt s/p R TKR and with hx of L TKR 1/20  Clinical Impression  Pt s/p R TKR and presents with decreased R LE strength/ROM and post op pain limiting functional mobility.  Pt plans dc home with family assist and follow up OP PT.    Follow Up Recommendations Follow surgeon's recommendation for DC plan and follow-up therapies    Equipment Recommendations  None recommended by PT    Recommendations for Other Services       Precautions / Restrictions Precautions Precautions: Knee;Fall Precaution Comments: pt states she kept knee therex handout from recent L TKR Restrictions Weight Bearing Restrictions: No LLE Weight Bearing: Weight bearing as tolerated      Mobility  Bed Mobility Overal bed mobility: Needs Assistance Bed Mobility: Supine to Sit     Supine to sit: Min guard;Supervision     General bed mobility comments: no assist needed  Transfers Overall transfer level: Needs assistance Equipment used: Rolling walker (2 wheeled) Transfers: Sit to/from Stand Sit to Stand: Min guard;Supervision         General transfer comment: VCs hand placement  Ambulation/Gait Ambulation/Gait assistance: Min Emergency planning/management officer (Feet): 100 Feet Assistive device: Rolling walker (2 wheeled) Gait Pattern/deviations: Step-to pattern Gait velocity: decr   General Gait Details: steady with RW, no buckling, VCs to lift head  Stairs Stairs: Yes Stairs assistance: Min guard Stair Management: Two rails;Step to pattern;Forwards Number of Stairs: 4 General stair comments: VCs sequencing, min/guard safety  Wheelchair Mobility    Modified Rankin (Stroke Patients Only)       Balance Overall balance assessment: Mild deficits observed, not formally tested                                            Pertinent Vitals/Pain Pain Assessment: 0-10 Pain Score: 6  Pain Location: R knee/thigh Pain Descriptors / Indicators: Sore Pain Intervention(s): Limited activity within patient's tolerance;Monitored during session;Premedicated before session;Ice applied    Home Living Family/patient expects to be discharged to:: Private residence Living Arrangements: Other relatives Available Help at Discharge: Family Type of Home: House Home Access: Stairs to enter Entrance Stairs-Rails: Doctor, general practice of Steps: 4 Home Layout: One level Home Equipment: Environmental consultant - 2 wheels      Prior Function Level of Independence: Independent         Comments: works full time for [employer]   Dominant Hand: Right    Extremity/Trunk Assessment   Upper Extremity Assessment Upper Extremity Assessment: Overall WFL for tasks assessed    Lower Extremity Assessment Lower Extremity Assessment: RLE deficits/detail    Cervical / Trunk Assessment Cervical / Trunk Assessment: Normal  Communication   Communication: No difficulties  Cognition Arousal/Alertness: Awake/alert Behavior During Therapy: WFL for tasks assessed/performed Overall Cognitive Status: Within Functional Limits for tasks assessed                                        General Comments      Exercises     Assessment/Plan    PT Assessment Patient needs continued PT services  PT Problem List Decreased strength;Decreased range of motion;Decreased  activity tolerance;Decreased balance;Decreased mobility;Decreased knowledge of use of DME;Decreased knowledge of precautions;Pain       PT Treatment Interventions DME instruction;Gait training;Stair training;Functional mobility training;Therapeutic activities;Therapeutic exercise;Balance training;Patient/family education;Manual techniques;Modalities    PT Goals (Current goals can be found in the Care Plan section)  Acute Rehab PT  Goals Patient Stated Goal: go to yard sales, walk at a school track PT Goal Formulation: With patient Time For Goal Achievement: 04/17/18 Potential to Achieve Goals: Good    Frequency 7X/week   Barriers to discharge        Co-evaluation               AM-PAC PT "6 Clicks" Mobility  Outcome Measure Help needed turning from your back to your side while in a flat bed without using bedrails?: None Help needed moving from lying on your back to sitting on the side of a flat bed without using bedrails?: None Help needed moving to and from a bed to a chair (including a wheelchair)?: A Little Help needed standing up from a chair using your arms (e.g., wheelchair or bedside chair)?: A Little Help needed to walk in hospital room?: A Little Help needed climbing 3-5 steps with a railing? : A Little 6 Click Score: 20    End of Session Equipment Utilized During Treatment: Gait belt Activity Tolerance: Patient tolerated treatment well Patient left: in chair;with call bell/phone within reach;with chair alarm set Nurse Communication: Mobility status PT Visit Diagnosis: Muscle weakness (generalized) (M62.81);Difficulty in walking, not elsewhere classified (R26.2);Pain Pain - Right/Left: Left Pain - part of body: Knee    Time: 0175-1025 PT Time Calculation (min) (ACUTE ONLY): 22 min   Charges:   PT Evaluation $PT Eval Low Complexity: 1 Low          Mauro Kaufmann PT Acute Rehabilitation Services Pager [phone] Office [phone]   Oksana Deberry 04/10/2018, 12:44 PM

## 2018-04-15 DIAGNOSIS — M25561 Pain in right knee: Secondary | ICD-10-CM | POA: Diagnosis not present

## 2018-04-15 DIAGNOSIS — Z9889 Other specified postprocedural states: Secondary | ICD-10-CM | POA: Diagnosis not present

## 2018-04-15 DIAGNOSIS — R262 Difficulty in walking, not elsewhere classified: Secondary | ICD-10-CM | POA: Diagnosis not present

## 2018-04-15 NOTE — Discharge Summary (Signed)
Physician Discharge Summary  Patient ID: YOLOTZIN MASCARENAS MRN: [id number] DOB/AGE: 65-Jun-1957 65 y.o.  Admit date: 04/09/2018 Discharge date: 04/10/2018   Procedures:  Procedure(s) (LRB): TOTAL KNEE ARTHROPLASTY (Right)  Attending Physician:  Dr. Durene Romans   Admission Diagnoses:   Right knee primary OA / pain  Discharge Diagnoses:  Principal Problem:   S/P right TKA Active Problems:   Obese  Past Medical History:  Diagnosis Date  . Anxiety   . Arthritis   . Complication of anesthesia   . Depression   . Hypertension   . PONV (postoperative nausea and vomiting)     HPI:    [name], 63 y.o. female, has a history of pain and functional disability in the right knee due to arthritis and has failed non-surgical conservative treatments for greater than 12 weeks to include NSAID's and/or analgesics, corticosteriod injections, viscosupplementation injections and activity modification.  Onset of symptoms was gradual, starting 2+ years ago with gradually worsening course since that time. The patient noted prior procedures on the knee to include  arthroplasty on the left knee per Dr. Charlann Boxer on 03/05/2018.  Patient currently rates pain in the right knee(s) at 9 out of 10 with activity. Patient has night pain, worsening of pain with activity and weight bearing, pain that interferes with activities of daily living, pain with passive range of motion, crepitus and joint swelling.  Patient has evidence of periarticular osteophytes and joint space narrowing by imaging studies. There is no active infection.  Risks, benefits and expectations were discussed with the patient.  Risks including but not limited to the risk of anesthesia, blood clots, nerve damage, blood vessel damage, failure of the prosthesis, infection and up to and including death.  Patient understand the risks, benefits and expectations and wishes to proceed with surgery.   PCP: Kirstie Peri, MD   Discharged Condition:  good  Hospital Course:  Patient underwent the above stated procedure on 04/09/2018. Patient tolerated the procedure well and brought to the recovery room in good condition and subsequently to the floor.  POD #1 BP: 131/76 ; Pulse: 71 ; Temp: 97.7 F (36.5 C) ; Resp: 16 Patient reports pain as mild, pain controlled.  No events throughout the night.  States that this knee is doing well and since she had the other knee not too long ago, she knows what to do with this knee.  She will start PT on Monday and I will giver her a script to bring with her. Ready to be discharged home.  Dorsiflexion/plantar flexion intact, incision: dressing C/D/I, no cellulitis present and compartment soft.   LABS  Basename    HGB     10.2  HCT     31.4    Discharge Exam: General appearance: alert, cooperative and no distress Extremities: Homans sign is negative, no sign of DVT, no edema, redness or tenderness in the calves or thighs and no ulcers, gangrene or trophic changes  Disposition:  Home with follow up in 2 weeks   Follow-up Information    Durene Romans, MD. Schedule an appointment as soon as possible for a visit in 2 weeks.   Specialty:  Orthopedic Surgery Contact information: [address] [place] [address] [phone]           Discharge Instructions    Call MD / Call 911   Complete by:  As directed    If you experience chest pain or shortness of breath, CALL 911 and be  transported to the hospital emergency room.  If you develope a fever above 101 F, pus (white drainage) or increased drainage or redness at the wound, or calf pain, call your surgeon's office.   Change dressing   Complete by:  As directed    Maintain surgical dressing until follow up in the clinic. If the edges start to pull up, may reinforce with tape. If the dressing is no longer working, may remove and cover with gauze and tape, but must keep the area dry and clean.  Call with any questions or concerns.     Constipation Prevention   Complete by:  As directed    Drink plenty of fluids.  Prune juice may be helpful.  You may use a stool softener, such as Colace (over the counter) 100 mg twice a day.  Use MiraLax (over the counter) for constipation as needed.   Diet - low sodium heart healthy   Complete by:  As directed    Discharge instructions   Complete by:  As directed    Maintain surgical dressing until follow up in the clinic. If the edges start to pull up, may reinforce with tape. If the dressing is no longer working, may remove and cover with gauze and tape, but must keep the area dry and clean.  Follow up in 2 weeks at [facility]. Call with any questions or concerns.   Increase activity slowly as tolerated   Complete by:  As directed    Weight bearing as tolerated with assist device (walker, cane, etc) as directed, use it as long as suggested by your surgeon or therapist, typically at least 4-6 weeks.   TED hose   Complete by:  As directed    Use stockings (TED hose) for 2 weeks on both leg(s).  You may remove them at night for sleeping.      Allergies as of 04/10/2018      Reactions   Percocet [oxycodone-acetaminophen] Nausea And Vomiting      Medication List    TAKE these medications   amLODipine 10 MG tablet Commonly known as:  NORVASC Take 10 mg by mouth daily.   aspirin 81 MG chewable tablet Commonly known as:  Aspirin Childrens Chew 1 tablet (81 mg total) by mouth 2 (two) times daily for 30 days. Take for 4 weeks, then resume regular dose.   buPROPion 150 MG 24 hr tablet Commonly known as:  WELLBUTRIN XL Take 1 tablet (150 mg total) by mouth daily.   celecoxib 100 MG capsule Commonly known as:  CELEBREX Take 100 mg by mouth 2 (two) times daily.   docusate sodium 100 MG capsule Commonly known as:  Colace Take 1 capsule (100 mg total) by mouth 2 (two) times daily.   escitalopram 20 MG tablet Commonly known as:  LEXAPRO Take 1 tablet (20 mg total) by  mouth daily. What changed:  when to take this   ferrous sulfate 325 (65 FE) MG tablet Commonly known as:  FerrouSul Take 1 tablet (325 mg total) by mouth 3 (three) times daily with meals.   HYDROcodone-acetaminophen 7.5-325 MG tablet Commonly known as:  Norco Take 1-2 tablets by mouth every 4 (four) hours as needed for moderate pain. What changed:  Another medication with the same name was removed. Continue taking this medication, and follow the directions you see here.   LORazepam 1 MG tablet Commonly known as:  ATIVAN Take 1 tablet (1 mg total) by mouth daily as needed for anxiety.  methocarbamol 500 MG tablet Commonly known as:  Robaxin Take 1 tablet (500 mg total) by mouth every 6 (six) hours as needed for muscle spasms.   metoprolol tartrate 25 MG tablet Commonly known as:  LOPRESSOR Take 25 mg by mouth 2 (two) times daily.   polyethylene glycol packet Commonly known as:  MIRALAX / GLYCOLAX Take 17 g by mouth 2 (two) times daily.   traZODone 50 MG tablet Commonly known as:  DESYREL Take 1 tablet (50 mg total) by mouth at bedtime as needed for sleep.            Discharge Care Instructions  (From admission, onward)         Start     Ordered   04/10/18 0000  Change dressing    Comments:  Maintain surgical dressing until follow up in the clinic. If the edges start to pull up, may reinforce with tape. If the dressing is no longer working, may remove and cover with gauze and tape, but must keep the area dry and clean.  Call with any questions or concerns.   04/10/18 [id number]           Signed: Anastasio Auerbach. Rei Contee   PA-C  04/15/2018, 11:29 PM

## 2018-04-15 NOTE — Progress Notes (Signed)
[facility] MD/PA/NP OP Progress Note  04/18/2018 4:42 PM [name]  MRN:  [id number]  Chief Complaint:  Chief Complaint    Depression; Follow-up     HPI:  - per chart review, patient underwent right total knee arthroplasty.  Patient presents for follow-up appointment for depression.  She states that she is interested in medication change as she still feels depressed.  She has anhedonia, and stays in the bed most of the time.  She will be out of work until April; although it will be much easier for the patient to stay out of work, she will make herself go there as she needs to.  She continues to miss her husband.  She also reports that she has a International aid/development worker at home with her mother, which has been difficult for the patient.  She goes to physical therapy 3 times a week.  She has fair sleep.  She feels fatigue.  She has fair concentration.  She denies SI. She has not noticed any drowsiness with ativan/opioid; she states that she rarely takes opioid at night as she usually has less pain at night.   Lorazepam filled on 03/21/2018   Visit Diagnosis:    ICD-10-CM   1. MDD (major depressive disorder), recurrent episode, mild (HCC) F33.0     Past Psychiatric History: Please see initial evaluation for full details. I have reviewed the history. No updates at this time.    Past Medical History:  Past Medical History:  Diagnosis Date  . Anxiety   . Arthritis   . Complication of anesthesia   . Depression   . Hypertension   . PONV (postoperative nausea and vomiting)     Past Surgical History:  Procedure Laterality Date  . FOOT NEUROMA SURGERY     right foot  . KNEE SURGERY     right arthroscopic  . 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 TOTAL KNEE ARTHROPLASTY Right 04/09/2018   Procedure: TOTAL KNEE ARTHROPLASTY;  Surgeon: Durene Romans, MD;   Location: [facility];  Service: Orthopedics;  Laterality: Right;  70 mins    Family Psychiatric History: Please see initial evaluation for full details. I have reviewed the history. No updates at this time.     Family History:  Family History  Problem Relation Age of Onset  . Drug abuse Son     Social History:  Social History   Socioeconomic History  . Marital status: Widowed    Spouse name: Not on file  . Number of children: Not on file  . Years of education: Not on file  . Highest education level: Not on file  Occupational History  . Not on file  Social Needs  . Financial resource strain: Not on file  . Food insecurity:    Worry: Not on file    Inability: Not on file  . Transportation needs:    Medical: Not on file    Non-medical: Not on file  Tobacco Use  . Smoking status: Current Every Day Smoker    Packs/day: 0.25    Years: 40.00    Pack years: 10.00    Types: Cigarettes    Start date: 10/15/2017  . Smokeless tobacco: Never Used  . Tobacco comment: smokes 2 cigarettes a day  Substance and Sexual Activity  . Alcohol use: Yes    Comment: occasional. 11-15-2016 occa  .  Drug use: No    Comment: 11-15-2016 per pt no  . Sexual activity: Not Currently  Lifestyle  . Physical activity:    Days per week: Not on file    Minutes per session: Not on file  . Stress: Not on file  Relationships  . Social connections:    Talks on phone: Not on file    Gets together: Not on file    Attends religious service: Not on file    Active member of club or organization: Not on file    Attends meetings of clubs or organizations: Not on file    Relationship status: Not on file  Other Topics Concern  . Not on file  Social History Narrative  . Not on file    Allergies:  Allergies  Allergen Reactions  . Percocet [Oxycodone-Acetaminophen] Nausea And Vomiting    Metabolic Disorder Labs: No results found for: HGBA1C, MPG No results found for: PROLACTIN No results found for:  CHOL, TRIG, HDL, CHOLHDL, VLDL, LDLCALC  Therapeutic Level Labs: No results found for: LITHIUM No results found for: VALPROATE No components found for:  CBMZ  Current Medications: Current Outpatient Medications  Medication Sig Dispense Refill  . amLODipine (NORVASC) 10 MG tablet Take 10 mg by mouth daily.     Marland Kitchen. aspirin (ASPIRIN CHILDRENS) 81 MG chewable tablet Chew 1 tablet (81 mg total) by mouth 2 (two) times daily for 30 days. Take for 4 weeks, then resume regular dose. 60 tablet 0  . buPROPion (WELLBUTRIN XL) 300 MG 24 hr tablet Take 1 tablet (300 mg total) by mouth daily. 90 tablet 0  . celecoxib (CELEBREX) 100 MG capsule Take 100 mg by mouth 2 (two) times daily.    Marland Kitchen. docusate sodium (COLACE) 100 MG capsule Take 1 capsule (100 mg total) by mouth 2 (two) times daily. 10 capsule 0  . escitalopram (LEXAPRO) 20 MG tablet Take 1 tablet (20 mg total) by mouth daily. 90 tablet 0  . ferrous sulfate (FERROUSUL) 325 (65 FE) MG tablet Take 1 tablet (325 mg total) by mouth 3 (three) times daily with meals.  3  . HYDROcodone-acetaminophen (NORCO) 7.5-325 MG tablet Take 1-2 tablets by mouth every 4 (four) hours as needed for moderate pain. 60 tablet 0  . LORazepam (ATIVAN) 1 MG tablet Take 1 tablet (1 mg total) by mouth daily as needed for anxiety. 30 tablet 2  . methocarbamol (ROBAXIN) 500 MG tablet Take 1 tablet (500 mg total) by mouth every 6 (six) hours as needed for muscle spasms. 40 tablet 0  . metoprolol tartrate (LOPRESSOR) 25 MG tablet Take 25 mg by mouth 2 (two) times daily.    . polyethylene glycol (MIRALAX / GLYCOLAX) packet Take 17 g by mouth 2 (two) times daily. 14 each 0  . traZODone (DESYREL) 50 MG tablet Take 1 tablet (50 mg total) by mouth at bedtime as needed for sleep. 90 tablet 0   No current facility-administered medications for this visit.      Musculoskeletal: Strength & Muscle Tone: within normal limits Gait & Station: normal Patient leans: N/A  Psychiatric Specialty  Exam: Review of Systems  Psychiatric/Behavioral: Positive for depression. Negative for hallucinations, memory loss, substance abuse and suicidal ideas. The patient is nervous/anxious. The patient does not have insomnia.   All other systems reviewed and are negative.   Blood pressure (!) 145/83, pulse 87, height 5' (1.524 m), weight 159 lb (72.1 kg).Body mass index is 31.05 kg/m.  General Appearance: Fairly Groomed  Eye  Contact:  Good  Speech:  Clear and Coherent  Volume:  Normal  Mood:  Depressed  Affect:  Appropriate, Congruent and calm, slightly down, but reactive  Thought Process:  Coherent  Orientation:  Full (Time, Place, and Person)  Thought Content: Logical   Suicidal Thoughts:  No  Homicidal Thoughts:  No  Memory:  Immediate;   Good  Judgement:  Good  Insight:  Good  Psychomotor Activity:  Normal  Concentration:  Concentration: Good and Attention Span: Good  Recall:  Good  Fund of Knowledge: Good  Language: Good  Akathisia:  No  Handed:  Right  AIMS (if indicated): not done  Assets:  Communication Skills Desire for Improvement  ADL's:  Intact  Cognition: WNL  Sleep:  Fair   Screenings:   Assessment and Plan:  [name] is a 63 y.o. year old female with a history of depression, COPD, hypertension, who presents for follow up appointment for MDD (major depressive disorder), recurrent episode, mild (HCC)  # MDD, mild, recurrent without psychotic features Patient reports continued depressive symptoms since the last visit.  Psychosocial stressors include loss of her husband, and her son who is in jail.  She also recently underwent knee surgery.  Will uptitrate bupropion as adjunctive treatment for depression.  Discussed potential side effect of worsening anxiety and headache.  She has no known history of seizure.  Will continue Lexapro to target depression.  Will continue Ativan as needed for anxiety.  Discussed risk of dependence and oversedation, especially  with concomitant use of opioid.  Will continue trazodone as needed for insomnia.  Validated her grief.   Plan 1. Continue lexapro 20 mg daily 2. Increase bupropion 300 mg daily  3.Continuelorazepam 1mg  dailyas needed for anxiety  4. Continue trazodone 25-50 mg at night as needed for sleep 5.Return to clinic inthree months for 15 mins - checked TSH by PCP in 01/2017; wnl per self report  The patient demonstrates the following risk factors for suicide: Chronic risk factors for suicide include:psychiatric disorder ofdepression, anxiety. Acute risk factorsfor suicide include: loss (financial, interpersonal, professional). Protective factorsfor this patient include: positive social support, responsibility to others (children, family), coping skills and hope for the future. Considering these factors, the overall suicide risk at this point appears to below. Patientisappropriate for outpatient follow up.   Neysa Hotter, MD 04/18/2018, 4:42 PM

## 2018-04-18 ENCOUNTER — Encounter (HOSPITAL_COMMUNITY): Payer: Self-pay | Admitting: Psychiatry

## 2018-04-18 ENCOUNTER — Other Ambulatory Visit: Payer: Self-pay

## 2018-04-18 ENCOUNTER — Ambulatory Visit (INDEPENDENT_AMBULATORY_CARE_PROVIDER_SITE_OTHER): Payer: 59 | Admitting: Psychiatry

## 2018-04-18 VITALS — BP 145/83 | HR 87 | Ht 60.0 in | Wt 159.0 lb

## 2018-04-18 DIAGNOSIS — Z634 Disappearance and death of family member: Secondary | ICD-10-CM | POA: Diagnosis not present

## 2018-04-18 DIAGNOSIS — Z638 Other specified problems related to primary support group: Secondary | ICD-10-CM

## 2018-04-18 DIAGNOSIS — Z79899 Other long term (current) drug therapy: Secondary | ICD-10-CM | POA: Diagnosis not present

## 2018-04-18 DIAGNOSIS — F330 Major depressive disorder, recurrent, mild: Secondary | ICD-10-CM | POA: Diagnosis not present

## 2018-04-18 MED ORDER — ESCITALOPRAM OXALATE 20 MG PO TABS: 20.0000 mg | ORAL_TABLET | Freq: Every day | ORAL | 0 refills | Status: DC

## 2018-04-18 MED ORDER — TRAZODONE HCL 50 MG PO TABS: 50.0000 mg | ORAL_TABLET | Freq: Every evening | ORAL | 0 refills | Status: DC | PRN

## 2018-04-18 MED ORDER — BUPROPION HCL ER (XL) 300 MG PO TB24: 300.0000 mg | ORAL_TABLET | Freq: Every day | ORAL | 0 refills | Status: DC

## 2018-04-18 MED ORDER — LORAZEPAM 1 MG PO TABS: 1.0000 mg | ORAL_TABLET | Freq: Every day | ORAL | 2 refills | Status: DC | PRN

## 2018-04-18 NOTE — Patient Instructions (Signed)
1. Continue lexapro 20 mg daily 2. Increase bupropion 300 mg daily  3.Continuelorazepam 1mg  dailyas needed for anxiety  4. Continue trazodone 25-50 mg at night as needed for sleep 5.Return to clinic inthree months for 15 mins

## 2018-04-24 DIAGNOSIS — Z96652 Presence of left artificial knee joint: Secondary | ICD-10-CM | POA: Diagnosis not present

## 2018-04-24 DIAGNOSIS — Z471 Aftercare following joint replacement surgery: Secondary | ICD-10-CM | POA: Diagnosis not present

## 2018-04-24 DIAGNOSIS — Z96653 Presence of artificial knee joint, bilateral: Secondary | ICD-10-CM | POA: Diagnosis not present

## 2018-05-06 DIAGNOSIS — F17210 Nicotine dependence, cigarettes, uncomplicated: Secondary | ICD-10-CM | POA: Diagnosis not present

## 2018-05-06 DIAGNOSIS — J449 Chronic obstructive pulmonary disease, unspecified: Secondary | ICD-10-CM | POA: Diagnosis not present

## 2018-05-06 DIAGNOSIS — J019 Acute sinusitis, unspecified: Secondary | ICD-10-CM | POA: Diagnosis not present

## 2018-05-07 DIAGNOSIS — Z471 Aftercare following joint replacement surgery: Secondary | ICD-10-CM | POA: Diagnosis not present

## 2018-05-07 DIAGNOSIS — M25561 Pain in right knee: Secondary | ICD-10-CM | POA: Diagnosis not present

## 2018-05-07 DIAGNOSIS — Z96651 Presence of right artificial knee joint: Secondary | ICD-10-CM | POA: Diagnosis not present

## 2018-05-09 DIAGNOSIS — M25561 Pain in right knee: Secondary | ICD-10-CM | POA: Diagnosis not present

## 2018-05-09 DIAGNOSIS — Z96651 Presence of right artificial knee joint: Secondary | ICD-10-CM | POA: Diagnosis not present

## 2018-05-09 DIAGNOSIS — Z471 Aftercare following joint replacement surgery: Secondary | ICD-10-CM | POA: Diagnosis not present

## 2018-05-10 DIAGNOSIS — M25561 Pain in right knee: Secondary | ICD-10-CM | POA: Diagnosis not present

## 2018-05-10 DIAGNOSIS — Z96651 Presence of right artificial knee joint: Secondary | ICD-10-CM | POA: Diagnosis not present

## 2018-05-10 DIAGNOSIS — Z471 Aftercare following joint replacement surgery: Secondary | ICD-10-CM | POA: Diagnosis not present

## 2018-05-14 DIAGNOSIS — M25561 Pain in right knee: Secondary | ICD-10-CM | POA: Diagnosis not present

## 2018-05-14 DIAGNOSIS — Z471 Aftercare following joint replacement surgery: Secondary | ICD-10-CM | POA: Diagnosis not present

## 2018-05-14 DIAGNOSIS — Z96651 Presence of right artificial knee joint: Secondary | ICD-10-CM | POA: Diagnosis not present

## 2018-05-16 DIAGNOSIS — M25561 Pain in right knee: Secondary | ICD-10-CM | POA: Diagnosis not present

## 2018-05-16 DIAGNOSIS — Z96651 Presence of right artificial knee joint: Secondary | ICD-10-CM | POA: Diagnosis not present

## 2018-05-16 DIAGNOSIS — Z471 Aftercare following joint replacement surgery: Secondary | ICD-10-CM | POA: Diagnosis not present

## 2018-05-17 DIAGNOSIS — Z471 Aftercare following joint replacement surgery: Secondary | ICD-10-CM | POA: Diagnosis not present

## 2018-05-17 DIAGNOSIS — M25561 Pain in right knee: Secondary | ICD-10-CM | POA: Diagnosis not present

## 2018-05-17 DIAGNOSIS — Z96651 Presence of right artificial knee joint: Secondary | ICD-10-CM | POA: Diagnosis not present

## 2018-05-21 DIAGNOSIS — M25561 Pain in right knee: Secondary | ICD-10-CM | POA: Diagnosis not present

## 2018-05-21 DIAGNOSIS — Z96651 Presence of right artificial knee joint: Secondary | ICD-10-CM | POA: Diagnosis not present

## 2018-05-21 DIAGNOSIS — Z471 Aftercare following joint replacement surgery: Secondary | ICD-10-CM | POA: Diagnosis not present

## 2018-05-23 DIAGNOSIS — Z96651 Presence of right artificial knee joint: Secondary | ICD-10-CM | POA: Diagnosis not present

## 2018-05-23 DIAGNOSIS — Z471 Aftercare following joint replacement surgery: Secondary | ICD-10-CM | POA: Diagnosis not present

## 2018-05-23 DIAGNOSIS — M25561 Pain in right knee: Secondary | ICD-10-CM | POA: Diagnosis not present

## 2018-05-24 DIAGNOSIS — Z96651 Presence of right artificial knee joint: Secondary | ICD-10-CM | POA: Diagnosis not present

## 2018-05-24 DIAGNOSIS — Z471 Aftercare following joint replacement surgery: Secondary | ICD-10-CM | POA: Diagnosis not present

## 2018-05-24 DIAGNOSIS — M25561 Pain in right knee: Secondary | ICD-10-CM | POA: Diagnosis not present

## 2018-05-28 DIAGNOSIS — Z96651 Presence of right artificial knee joint: Secondary | ICD-10-CM | POA: Diagnosis not present

## 2018-05-28 DIAGNOSIS — Z471 Aftercare following joint replacement surgery: Secondary | ICD-10-CM | POA: Diagnosis not present

## 2018-05-28 DIAGNOSIS — M25561 Pain in right knee: Secondary | ICD-10-CM | POA: Diagnosis not present

## 2018-05-30 DIAGNOSIS — Z471 Aftercare following joint replacement surgery: Secondary | ICD-10-CM | POA: Diagnosis not present

## 2018-05-30 DIAGNOSIS — Z96651 Presence of right artificial knee joint: Secondary | ICD-10-CM | POA: Diagnosis not present

## 2018-05-30 DIAGNOSIS — M25561 Pain in right knee: Secondary | ICD-10-CM | POA: Diagnosis not present

## 2018-05-31 DIAGNOSIS — Z471 Aftercare following joint replacement surgery: Secondary | ICD-10-CM | POA: Diagnosis not present

## 2018-05-31 DIAGNOSIS — M25561 Pain in right knee: Secondary | ICD-10-CM | POA: Diagnosis not present

## 2018-05-31 DIAGNOSIS — Z96651 Presence of right artificial knee joint: Secondary | ICD-10-CM | POA: Diagnosis not present

## 2018-06-03 DIAGNOSIS — M25561 Pain in right knee: Secondary | ICD-10-CM | POA: Diagnosis not present

## 2018-06-03 DIAGNOSIS — Z471 Aftercare following joint replacement surgery: Secondary | ICD-10-CM | POA: Diagnosis not present

## 2018-06-03 DIAGNOSIS — Z96651 Presence of right artificial knee joint: Secondary | ICD-10-CM | POA: Diagnosis not present

## 2018-06-05 DIAGNOSIS — Z471 Aftercare following joint replacement surgery: Secondary | ICD-10-CM | POA: Diagnosis not present

## 2018-06-05 DIAGNOSIS — M25561 Pain in right knee: Secondary | ICD-10-CM | POA: Diagnosis not present

## 2018-06-05 DIAGNOSIS — Z96651 Presence of right artificial knee joint: Secondary | ICD-10-CM | POA: Diagnosis not present

## 2018-06-07 DIAGNOSIS — M25561 Pain in right knee: Secondary | ICD-10-CM | POA: Diagnosis not present

## 2018-06-07 DIAGNOSIS — Z471 Aftercare following joint replacement surgery: Secondary | ICD-10-CM | POA: Diagnosis not present

## 2018-06-07 DIAGNOSIS — Z96651 Presence of right artificial knee joint: Secondary | ICD-10-CM | POA: Diagnosis not present

## 2018-06-10 DIAGNOSIS — Z96651 Presence of right artificial knee joint: Secondary | ICD-10-CM | POA: Diagnosis not present

## 2018-06-10 DIAGNOSIS — M25561 Pain in right knee: Secondary | ICD-10-CM | POA: Diagnosis not present

## 2018-06-10 DIAGNOSIS — Z471 Aftercare following joint replacement surgery: Secondary | ICD-10-CM | POA: Diagnosis not present

## 2018-06-12 DIAGNOSIS — Z96651 Presence of right artificial knee joint: Secondary | ICD-10-CM | POA: Diagnosis not present

## 2018-06-12 DIAGNOSIS — M25561 Pain in right knee: Secondary | ICD-10-CM | POA: Diagnosis not present

## 2018-06-12 DIAGNOSIS — Z471 Aftercare following joint replacement surgery: Secondary | ICD-10-CM | POA: Diagnosis not present

## 2018-06-14 DIAGNOSIS — M25561 Pain in right knee: Secondary | ICD-10-CM | POA: Diagnosis not present

## 2018-06-14 DIAGNOSIS — Z471 Aftercare following joint replacement surgery: Secondary | ICD-10-CM | POA: Diagnosis not present

## 2018-06-14 DIAGNOSIS — Z96651 Presence of right artificial knee joint: Secondary | ICD-10-CM | POA: Diagnosis not present

## 2018-06-17 DIAGNOSIS — M25561 Pain in right knee: Secondary | ICD-10-CM | POA: Diagnosis not present

## 2018-06-17 DIAGNOSIS — Z96651 Presence of right artificial knee joint: Secondary | ICD-10-CM | POA: Diagnosis not present

## 2018-06-17 DIAGNOSIS — Z471 Aftercare following joint replacement surgery: Secondary | ICD-10-CM | POA: Diagnosis not present

## 2018-06-19 DIAGNOSIS — Z96651 Presence of right artificial knee joint: Secondary | ICD-10-CM | POA: Diagnosis not present

## 2018-06-19 DIAGNOSIS — M25561 Pain in right knee: Secondary | ICD-10-CM | POA: Diagnosis not present

## 2018-06-19 DIAGNOSIS — Z471 Aftercare following joint replacement surgery: Secondary | ICD-10-CM | POA: Diagnosis not present

## 2018-06-20 DIAGNOSIS — Z96651 Presence of right artificial knee joint: Secondary | ICD-10-CM | POA: Diagnosis not present

## 2018-06-20 DIAGNOSIS — J449 Chronic obstructive pulmonary disease, unspecified: Secondary | ICD-10-CM | POA: Diagnosis not present

## 2018-06-20 DIAGNOSIS — J441 Chronic obstructive pulmonary disease with (acute) exacerbation: Secondary | ICD-10-CM | POA: Diagnosis not present

## 2018-06-20 DIAGNOSIS — M25561 Pain in right knee: Secondary | ICD-10-CM | POA: Diagnosis not present

## 2018-06-20 DIAGNOSIS — Z471 Aftercare following joint replacement surgery: Secondary | ICD-10-CM | POA: Diagnosis not present

## 2018-06-25 DIAGNOSIS — Z471 Aftercare following joint replacement surgery: Secondary | ICD-10-CM | POA: Diagnosis not present

## 2018-06-25 DIAGNOSIS — Z96651 Presence of right artificial knee joint: Secondary | ICD-10-CM | POA: Diagnosis not present

## 2018-06-25 DIAGNOSIS — M25561 Pain in right knee: Secondary | ICD-10-CM | POA: Diagnosis not present

## 2018-06-27 DIAGNOSIS — Z471 Aftercare following joint replacement surgery: Secondary | ICD-10-CM | POA: Diagnosis not present

## 2018-06-27 DIAGNOSIS — M25561 Pain in right knee: Secondary | ICD-10-CM | POA: Diagnosis not present

## 2018-06-27 DIAGNOSIS — Z96651 Presence of right artificial knee joint: Secondary | ICD-10-CM | POA: Diagnosis not present

## 2018-07-17 ENCOUNTER — Telehealth (HOSPITAL_COMMUNITY): Payer: Self-pay | Admitting: Psychiatry

## 2018-07-17 MED ORDER — BUPROPION HCL ER (XL) 300 MG PO TB24: 300.0000 mg | ORAL_TABLET | Freq: Every day | ORAL | 0 refills | Status: DC

## 2018-07-17 NOTE — Telephone Encounter (Signed)
APPT 07/19/2018

## 2018-07-17 NOTE — Progress Notes (Signed)
Virtual Visit via Video Note  I connected with Lorelle Formosaatricia M Niehaus on 07/19/18 at  9:20 AM EDT by a video enabled telemedicine application and verified that I am speaking with the correct person using two identifiers.   I discussed the limitations of evaluation and management by telemedicine and the availability of in person appointments. The patient expressed understanding and agreed to proceed.   I discussed the assessment and treatment plan with the patient. The patient was provided an opportunity to ask questions and all were answered. The patient agreed with the plan and demonstrated an understanding of the instructions.   The patient was advised to call back or seek an in-person evaluation if the symptoms worsen or if the condition fails to improve as anticipated.  I provided 15 minutes of non-face-to-face time during this encounter.   Neysa Hottereina Natallie Ravenscroft, MD    [facility]  07/19/2018 9:40 AM Drenda Freezeatricia M Maybee  MRN:  [id number]  Chief Complaint:  Chief Complaint    Depression; Anxiety     HPI:  This is a follow-up visit for depression.  She states that she has not been doing well.  She returned to work last week; she has been struggling with depression and anxiety.  She feels very stressed that she needs to keep up with her work as she is to learn things all over as she was out of work more than a month.  She has not been able to enjoy anything; she goes to work or goes to bed.  She is interested in adjusting her medication.  She sleeps well.  She feels fatigue.  She feels depressed.  She has fair concentration.  She denies SI.  She feels anxious and tense at times.  She denies panic attacks.    Visit Diagnosis:    ICD-10-CM   1. MDD (major depressive disorder), recurrent episode, mild (HCC)  F33.0     Past Psychiatric History: Please see initial evaluation for full details. I have reviewed the history. No updates at this time.     Past Medical History:   Past Medical History:  Diagnosis Date  . Anxiety   . Arthritis   . Complication of anesthesia   . Depression   . Hypertension   . PONV (postoperative nausea and vomiting)     Past Surgical History:  Procedure Laterality Date  . FOOT NEUROMA SURGERY     right foot  . KNEE SURGERY     right arthroscopic  . 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 TOTAL KNEE ARTHROPLASTY Right 04/09/2018   Procedure: TOTAL KNEE ARTHROPLASTY;  Surgeon: Durene Romanslin, Matthew, MD;  Location: [facility];  Service: Orthopedics;  Laterality: Right;  70 mins    Family Psychiatric History: Please see initial evaluation for full details. I have reviewed the history. No updates at this time.     Family History:  Family History  Problem Relation Age of Onset  . Drug abuse Son     Social History:  Social History   Socioeconomic History  . Marital status: Widowed    Spouse name: Not on file  . Number of children: Not on file  . Years of education: Not on file  . Highest education level: Not on file  Occupational History  . Not on file  Social Needs  . Financial resource strain: Not on  file  . Food insecurity    Worry: Not on file    Inability: Not on file  . Transportation needs    Medical: Not on file    Non-medical: Not on file  Tobacco Use  . Smoking status: Current Every Day Smoker    Packs/day: 0.25    Years: 40.00    Pack years: 10.00    Types: Cigarettes    Start date: 10/15/2017  . Smokeless tobacco: Never Used  . Tobacco comment: smokes 2 cigarettes a day  Substance and Sexual Activity  . Alcohol use: Yes    Comment: occasional. 11-15-2016 occa  . Drug use: No    Comment: 11-15-2016 per pt no  . Sexual activity: Not Currently  Lifestyle  . Physical activity    Days per week: Not on file    Minutes per session: Not on file  . Stress: Not on file   Relationships  . Social Musicianconnections    Talks on phone: Not on file    Gets together: Not on file    Attends religious service: Not on file    Active member of club or organization: Not on file    Attends meetings of clubs or organizations: Not on file    Relationship status: Not on file  Other Topics Concern  . Not on file  Social History Narrative  . Not on file    Allergies:  Allergies  Allergen Reactions  . Percocet [Oxycodone-Acetaminophen] Nausea And Vomiting    Metabolic Disorder Labs:  Therapeutic Level Labs: No results found for: LITHIUM No results found for: VALPROATE No components found for:  CBMZ  Current Medications: Current Outpatient Medications  Medication Sig Dispense Refill  . amLODipine (NORVASC) 10 MG tablet Take 10 mg by mouth daily.     Marland Kitchen. buPROPion (WELLBUTRIN XL) 150 MG 24 hr tablet Total of 450 mg daily (300 mg + 150 mg) 90 tablet 0  . buPROPion (WELLBUTRIN XL) 300 MG 24 hr tablet Take 1 tablet (300 mg total) by mouth daily. 90 tablet 0  . celecoxib (CELEBREX) 100 MG capsule Take 100 mg by mouth 2 (two) times daily.    Marland Kitchen. docusate sodium (COLACE) 100 MG capsule Take 1 capsule (100 mg total) by mouth 2 (two) times daily. 10 capsule 0  . escitalopram (LEXAPRO) 20 MG tablet Take 1 tablet (20 mg total) by mouth daily. 90 tablet 0  . ferrous sulfate (FERROUSUL) 325 (65 FE) MG tablet Take 1 tablet (325 mg total) by mouth 3 (three) times daily with meals.  3  . HYDROcodone-acetaminophen (NORCO) 7.5-325 MG tablet Take 1-2 tablets by mouth every 4 (four) hours as needed for moderate pain. 60 tablet 0  . LORazepam (ATIVAN) 1 MG tablet Take 1 tablet (1 mg total) by mouth daily as needed for anxiety. 30 tablet 1  . methocarbamol (ROBAXIN) 500 MG tablet Take 1 tablet (500 mg total) by mouth every 6 (six) hours as needed for muscle spasms. 40 tablet 0  . metoprolol tartrate (LOPRESSOR) 25 MG tablet Take 25 mg by mouth 2 (two) times daily.    . polyethylene glycol  (MIRALAX / GLYCOLAX) packet Take 17 g by mouth 2 (two) times daily. 14 each 0  . traZODone (DESYREL) 50 MG tablet Take 1 tablet (50 mg total) by mouth at bedtime as needed for sleep. 90 tablet 0   No current facility-administered medications for this visit.      Musculoskeletal: Strength & Muscle Tone: Good Gait & Station:  N/A Patient leans: N/A  Psychiatric Specialty Exam: Review of Systems  Psychiatric/Behavioral: Positive for depression. Negative for hallucinations, memory loss, substance abuse and suicidal ideas. The patient is nervous/anxious. The patient does not have insomnia.   All other systems reviewed and are negative.   There were no vitals taken for this visit.There is no height or weight on file to calculate BMI.  General Appearance: Fairly Groomed  Eye Contact:  Good  Speech:  Clear and Coherent  Volume:  Normal  Mood:  Depressed  Affect:  Appropriate, Congruent and slightly down  Thought Process:  Coherent  Orientation:  Full (Time, Place, and Person)  Thought Content: Logical   Suicidal Thoughts:  No  Homicidal Thoughts:  No  Memory:  Immediate;   Good  Judgement:  Good  Insight:  Fair  Psychomotor Activity:  Normal  Concentration:  Concentration: Good and Attention Span: Good  Recall:  Good  Fund of Knowledge: Good  Language: Good  Akathisia:  No  Handed:  Right  AIMS (if indicated): not done  Assets:  Communication Skills Desire for Improvement  ADL's:  Intact  Cognition: WNL  Sleep:  Good   Screenings:   Assessment and Plan:  BINA VEENSTRA is a 63 y.o. year old female with a history of depression, COPD< hypertension, who presents for follow up appointment for depression.   # MDD, mild, recurrent without psychotic features Patient reports slightly worsening in depressive symptoms in the context of return to work.  Other psychosocial stressors includes loss of her husband, and her son who is in jail.  She also recently underwent knee  surgery.  Will uptitrate bupropion as adjunctive treatment for depression.  Discussed potential side effect of worsening anxiety and headache.  She has no known history of seizure.  Will continue Lexapro to target depression.  Will continue Ativan as needed for anxiety.  Discussed risk of dependence and oversedation, especially with concomitant use of opioid.  Will continue trazodone as needed for insomnia.  Discussed behavioral activation.   Plan I have reviewed and updated plans as below 1. Continue lexapro 20 mg daily 2. Increase bupropion 450 mg (300 mg + 150 mg) daily  3.Continuelorazepam 1mg  dailyas needed for anxiety 4.Continuetrazodone 25-50 mg at night as needed for sleep 5.Next appointment: 8/4 3 PM for 20 mins, video - checked TSH by PCP in 01/2017; wnl per self report  The patient demonstrates the following risk factors for suicide: Chronic risk factors for suicide include:psychiatric disorder ofdepression, anxiety. Acute risk factorsfor suicide include: loss (financial, interpersonal, professional). Protective factorsfor this patient include: positive social support, responsibility to others (children, family), coping skills and hope for the future. Considering these factors, the overall suicide risk at this point appears to below. Patientisappropriate for outpatient follow up.   Norman Clay, MD 07/19/2018, 9:40 AM

## 2018-07-17 NOTE — Telephone Encounter (Signed)
Ordered refill for bupropion per request. Please contact the patient and remind her to make follow up appointment.

## 2018-07-19 ENCOUNTER — Ambulatory Visit (INDEPENDENT_AMBULATORY_CARE_PROVIDER_SITE_OTHER): Payer: 59 | Admitting: Psychiatry

## 2018-07-19 ENCOUNTER — Encounter (HOSPITAL_COMMUNITY): Payer: Self-pay | Admitting: Psychiatry

## 2018-07-19 ENCOUNTER — Other Ambulatory Visit: Payer: Self-pay

## 2018-07-19 DIAGNOSIS — F330 Major depressive disorder, recurrent, mild: Secondary | ICD-10-CM

## 2018-07-19 MED ORDER — BUPROPION HCL ER (XL) 150 MG PO TB24: ORAL_TABLET | ORAL | 0 refills | Status: DC

## 2018-07-19 MED ORDER — TRAZODONE HCL 50 MG PO TABS: 50.0000 mg | ORAL_TABLET | Freq: Every evening | ORAL | 0 refills | Status: DC | PRN

## 2018-07-19 MED ORDER — LORAZEPAM 1 MG PO TABS: 1.0000 mg | ORAL_TABLET | Freq: Every day | ORAL | 1 refills | Status: DC | PRN

## 2018-07-19 MED ORDER — ESCITALOPRAM OXALATE 20 MG PO TABS: 20.0000 mg | ORAL_TABLET | Freq: Every day | ORAL | 0 refills | Status: DC

## 2018-07-19 NOTE — Patient Instructions (Signed)
1. Continue lexapro 20 mg daily 2. Increase bupropion 450 mg (300 mg + 150 mg) daily  3.Continuelorazepam 1mg  dailyas needed for anxiety 4.Continuetrazodone 25-50 mg at night as needed for sleep 5.Next appointment: 8/4 3 PM

## 2018-09-04 NOTE — Progress Notes (Deleted)
BH MD/PA/NP OP Progress Note  09/04/2018 1:16 PM [name]  MRN:  [id number]  Chief Complaint:  HPI: *** Visit Diagnosis: No diagnosis found.  Past Psychiatric History: Please see initial evaluation for full details. I have reviewed the history. No updates at this time.     Past Medical History:  Past Medical History:  Diagnosis Date  . Anxiety   . Arthritis   . Complication of anesthesia   . Depression   . Hypertension   . PONV (postoperative nausea and vomiting)     Past Surgical History:  Procedure Laterality Date  . FOOT NEUROMA SURGERY     right foot  . KNEE SURGERY     right arthroscopic  . 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 TOTAL KNEE ARTHROPLASTY Right 04/09/2018   Procedure: TOTAL KNEE ARTHROPLASTY;  Surgeon: Durene Romanslin, Matthew, MD;  Location: [facility];  Service: Orthopedics;  Laterality: Right;  70 mins    Family Psychiatric History: Please see initial evaluation for full details. I have reviewed the history. No updates at this time.     Family History:  Family History  Problem Relation Age of Onset  . Drug abuse Son     Social History:  Social History   Socioeconomic History  . Marital status: Widowed    Spouse name: Not on file  . Number of children: Not on file  . Years of education: Not on file  . Highest education level: Not on file  Occupational History  . Not on file  Social Needs  . Financial resource strain: Not on file  . Food insecurity    Worry: Not on file    Inability: Not on file  . Transportation needs    Medical: Not on file    Non-medical: Not on file  Tobacco Use  . Smoking status: Current Every Day Smoker    Packs/day: 0.25    Years: 40.00    Pack years: 10.00    Types: Cigarettes    Start date: 10/15/2017  . Smokeless tobacco: Never Used  . Tobacco comment: smokes 2  cigarettes a day  Substance and Sexual Activity  . Alcohol use: Yes    Comment: occasional. 11-15-2016 occa  . Drug use: No    Comment: 11-15-2016 per pt no  . Sexual activity: Not Currently  Lifestyle  . Physical activity    Days per week: Not on file    Minutes per session: Not on file  . Stress: Not on file  Relationships  . Social Musicianconnections    Talks on phone: Not on file    Gets together: Not on file    Attends religious service: Not on file    Active member of club or organization: Not on file    Attends meetings of clubs or organizations: Not on file    Relationship status: Not on file  Other Topics Concern  . Not on file  Social History Narrative  . Not on file    Allergies:  Allergies  Allergen Reactions  . Percocet [Oxycodone-Acetaminophen] Nausea And Vomiting    Metabolic Disorder Labs: No results found for: HGBA1C, MPG No results found for: PROLACTIN No results found for: CHOL, TRIG, HDL, CHOLHDL, VLDL, LDLCALC Lab Results  Component Value Date   TSH 1.889 ***Test methodology is 3rd generation TSH*** 07/18/2007    Therapeutic Level  Labs: No results found for: LITHIUM No results found for: VALPROATE No components found for:  CBMZ  Current Medications: Current Outpatient Medications  Medication Sig Dispense Refill  . amLODipine (NORVASC) 10 MG tablet Take 10 mg by mouth daily.     Marland Kitchen buPROPion (WELLBUTRIN XL) 150 MG 24 hr tablet Total of 450 mg daily (300 mg + 150 mg) 90 tablet 0  . buPROPion (WELLBUTRIN XL) 300 MG 24 hr tablet Take 1 tablet (300 mg total) by mouth daily. 90 tablet 0  . celecoxib (CELEBREX) 100 MG capsule Take 100 mg by mouth 2 (two) times daily.    Marland Kitchen docusate sodium (COLACE) 100 MG capsule Take 1 capsule (100 mg total) by mouth 2 (two) times daily. 10 capsule 0  . escitalopram (LEXAPRO) 20 MG tablet Take 1 tablet (20 mg total) by mouth daily. 90 tablet 0  . ferrous sulfate (FERROUSUL) 325 (65 FE) MG tablet Take 1 tablet (325 mg total)  by mouth 3 (three) times daily with meals.  3  . HYDROcodone-acetaminophen (NORCO) 7.5-325 MG tablet Take 1-2 tablets by mouth every 4 (four) hours as needed for moderate pain. 60 tablet 0  . LORazepam (ATIVAN) 1 MG tablet Take 1 tablet (1 mg total) by mouth daily as needed for anxiety. 30 tablet 1  . methocarbamol (ROBAXIN) 500 MG tablet Take 1 tablet (500 mg total) by mouth every 6 (six) hours as needed for muscle spasms. 40 tablet 0  . metoprolol tartrate (LOPRESSOR) 25 MG tablet Take 25 mg by mouth 2 (two) times daily.    . polyethylene glycol (MIRALAX / GLYCOLAX) packet Take 17 g by mouth 2 (two) times daily. 14 each 0  . traZODone (DESYREL) 50 MG tablet Take 1 tablet (50 mg total) by mouth at bedtime as needed for sleep. 90 tablet 0   No current facility-administered medications for this visit.      Musculoskeletal: Strength & Muscle Tone: N/A Gait & Station: N/A Patient leans: N/A  Psychiatric Specialty Exam: ROS  There were no vitals taken for this visit.There is no height or weight on file to calculate BMI.  General Appearance: {Appearance:22683}  Eye Contact:  {[facility] EYE CONTACT:22684}  Speech:  Clear and Coherent  Volume:  Normal  Mood:  {[facility] MOOD:22306}  Affect:  {Affect (PAA):22687}  Thought Process:  Coherent  Orientation:  Full (Time, Place, and Person)  Thought Content: Logical   Suicidal Thoughts:  {ST/HT (PAA):22692}  Homicidal Thoughts:  {ST/HT (PAA):22692}  Memory:  Immediate;   Good  Judgement:  {Judgement (PAA):22694}  Insight:  {Insight (PAA):22695}  Psychomotor Activity:  Normal  Concentration:  Concentration: Good and Attention Span: Good  Recall:  Good  Fund of Knowledge: Good  Language: Good  Akathisia:  No  Handed:  Right  AIMS (if indicated): not done  Assets:  Communication Skills Desire for Improvement  ADL's:  Intact  Cognition: WNL  Sleep:  {[facility] GOOD/FAIR/POOR:22877}   Screenings:   Assessment and Plan:  [name] is a 63  y.o. year old female with a history of depression, COPD, hypertension, who presents for follow up appointment for No diagnosis found.  # MDD, mild,, recurrent without psychotic features  Patient reports slightly worsening in depressive symptoms in the context of return to work.  Other psychosocial stressors includes loss of her husband, and her son who is in jail.  She also recently underwent knee surgery.  Will uptitrate bupropion as adjunctive treatment for depression.  Discussed potential side effect of  worsening anxiety and headache.  She has no known history of seizure.  Will continue Lexapro to target depression.  Will continue Ativan as needed for anxiety.  Discussed risk of dependence and oversedation, especially with concomitant use of opioid.  Will continue trazodone as needed for insomnia.  Discussed behavioral activation.   Plan  1. Continue lexapro 20 mg daily 2.Increase bupropion 450 mg (300 mg + 150 mg) daily  3.Continuelorazepam 1mg  dailyas needed for anxiety 4.Continuetrazodone 25-50 mg at night as needed for sleep 5.Next appointment: 8/4 3 PM for 20 mins, video - checked TSH by PCP in 01/2017; wnl per self report  The patient demonstrates the following risk factors for suicide: Chronic risk factors for suicide include:psychiatric disorder ofdepression, anxiety. Acute risk factorsfor suicide include: loss (financial, interpersonal, professional). Protective factorsfor this patient include: positive social support, responsibility to others (children, family), coping skills and hope for the future. Considering these factors, the overall suicide risk at this point appears to below. Patientisappropriate for outpatient follow up.  Neysa Hottereina Riddik Senna, MD 09/04/2018, 1:16 PM

## 2018-09-10 ENCOUNTER — Other Ambulatory Visit: Payer: Self-pay

## 2018-09-10 ENCOUNTER — Ambulatory Visit (HOSPITAL_COMMUNITY): Payer: 59 | Admitting: Psychiatry

## 2018-09-18 NOTE — Progress Notes (Signed)
Virtual Visit via Video Note  I connected with Faith Formosaatricia M Bures on 09/19/18 at  3:30 PM EDT by a video enabled telemedicine application and verified that I am speaking with the correct person using two identifiers.   I discussed the limitations of evaluation and management by telemedicine and the availability of in person appointments. The patient expressed understanding and agreed to proceed.     I discussed the assessment and treatment plan with the patient. The patient was provided an opportunity to ask questions and all were answered. The patient agreed with the plan and demonstrated an understanding of the instructions.   The patient was advised to call back or seek an in-person evaluation if the symptoms worsen or if the condition fails to improve as anticipated.  I provided 15 minutes of non-face-to-face time during this encounter.   Faith Hottereina Rovena Hearld, MD    [facility] OP Progress Note  09/19/2018 3:43 PM [name]  MRN:  [id number]  Chief Complaint:  Chief Complaint    Anxiety; Depression; Follow-up     HPI:  This is a follow-up appointment for depression.  She states that she has been feeling much better after up titration of bupropion.  She has been working full-time, 6 days a week.  She has been handling things better.  She tends to lie in the bed when she does not work. Her granddaughter was born two weeks ago. She takes care of her at times so that her daughter in law can sleep. Although she feels concerned that she has not been able to speak with her son in jail, he is aware that his daughter is born.  She sleeps better.  She has more motivation and energy.  She has good concentration.  She has good appetite.  She denies SI. She feels less anxious. She denies panic attacks. She takes ativan regularly for anxiety/insomnia.   Visit Diagnosis:    ICD-10-CM   1. MDD (major depressive disorder), recurrent, in partial remission (HCC)  F33.41     Past Psychiatric  History: Please see initial evaluation for full details. I have reviewed the history. No updates at this time.     Past Medical History:  Past Medical History:  Diagnosis Date  . Anxiety   . Arthritis   . Complication of anesthesia   . Depression   . Hypertension   . PONV (postoperative nausea and vomiting)     Past Surgical History:  Procedure Laterality Date  . FOOT NEUROMA SURGERY     right foot  . KNEE SURGERY     right arthroscopic  . 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 TOTAL KNEE ARTHROPLASTY Right 04/09/2018   Procedure: TOTAL KNEE ARTHROPLASTY;  Surgeon: Durene Romanslin, Matthew, MD;  Location: [facility];  Service: Orthopedics;  Laterality: Right;  70 mins    Family Psychiatric History: Please see initial evaluation for full details. I have reviewed the history. No updates at this time.     Family History:  Family History  Problem Relation Age of Onset  . Drug abuse Son     Social History:  Social History   Socioeconomic History  . Marital status: Widowed    Spouse name: Not on file  . Number of children: Not on file  . Years of education: Not on file  . Highest education level: Not on file  Occupational History  . Not on file  Social Needs  . Financial resource strain: Not on file  . Food insecurity    Worry: Not on file    Inability: Not on file  . Transportation needs    Medical: Not on file    Non-medical: Not on file  Tobacco Use  . Smoking status: Current Every Day Smoker    Packs/day: 0.25    Years: 40.00    Pack years: 10.00    Types: Cigarettes    Start date: 10/15/2017  . Smokeless tobacco: Never Used  . Tobacco comment: smokes 2 cigarettes a day  Substance and Sexual Activity  . Alcohol use: Yes    Comment: occasional. 11-15-2016 occa  . Drug use: No    Comment: 11-15-2016 per pt no  . Sexual activity:  Not Currently  Lifestyle  . Physical activity    Days per week: Not on file    Minutes per session: Not on file  . Stress: Not on file  Relationships  . Social Musicianconnections    Talks on phone: Not on file    Gets together: Not on file    Attends religious service: Not on file    Active member of club or organization: Not on file    Attends meetings of clubs or organizations: Not on file    Relationship status: Not on file  Other Topics Concern  . Not on file  Social History Narrative  . Not on file    Allergies:  Allergies  Allergen Reactions  . Percocet [Oxycodone-Acetaminophen] Nausea And Vomiting    Metabolic Disorder Labs: No results found for: HGBA1C, MPG No results found for: PROLACTIN No results found for: CHOL, TRIG, HDL, CHOLHDL, VLDL, LDLCALC  Therapeutic Level Labs: No results found for: LITHIUM No results found for: VALPROATE No components found for:  CBMZ  Current Medications: Current Outpatient Medications  Medication Sig Dispense Refill  . amLODipine (NORVASC) 10 MG tablet Take 10 mg by mouth daily.     Melene Muller. [START ON 10/19/2018] buPROPion (WELLBUTRIN XL) 150 MG 24 hr tablet Total of 450 mg daily (300 mg + 150 mg) 90 tablet 0  . [START ON 10/19/2018] buPROPion (WELLBUTRIN XL) 300 MG 24 hr tablet Take 1 tablet (300 mg total) by mouth daily. 90 tablet 0  . celecoxib (CELEBREX) 100 MG capsule Take 100 mg by mouth 2 (two) times daily.    Marland Kitchen. docusate sodium (COLACE) 100 MG capsule Take 1 capsule (100 mg total) by mouth 2 (two) times daily. 10 capsule 0  . [START ON 10/19/2018] escitalopram (LEXAPRO) 20 MG tablet Take 1 tablet (20 mg total) by mouth daily. 90 tablet 0  . ferrous sulfate (FERROUSUL) 325 (65 FE) MG tablet Take 1 tablet (325 mg total) by mouth 3 (three) times daily with meals.  3  . HYDROcodone-acetaminophen (NORCO) 7.5-325 MG tablet Take 1-2 tablets by mouth every 4 (four) hours as needed for moderate pain. 60 tablet 0  . [START ON 09/25/2018] LORazepam  (ATIVAN) 1 MG tablet Take 1 tablet (1 mg total) by mouth daily as needed for anxiety. 30 tablet 2  . methocarbamol (ROBAXIN) 500 MG tablet Take 1 tablet (500 mg total) by mouth every 6 (six) hours as needed for muscle spasms. 40 tablet 0  . metoprolol tartrate (LOPRESSOR) 25 MG tablet Take 25 mg by mouth 2 (two) times daily.    . polyethylene glycol (MIRALAX / GLYCOLAX) packet Take 17 g by mouth 2 (two) times daily.  14 each 0  . [START ON 10/19/2018] traZODone (DESYREL) 50 MG tablet Take 1 tablet (50 mg total) by mouth at bedtime as needed for sleep. 90 tablet 0   No current facility-administered medications for this visit.      Musculoskeletal: Strength & Muscle Tone: N/A Gait & Station: N?A Patient leans: N/A  Psychiatric Specialty Exam: Review of Systems  Psychiatric/Behavioral: Negative for depression, hallucinations, memory loss, substance abuse and suicidal ideas. The patient is nervous/anxious. The patient does not have insomnia.   All other systems reviewed and are negative.   There were no vitals taken for this visit.There is no height or weight on file to calculate BMI.  General Appearance: Fairly Groomed  Eye Contact:  Good  Speech:  Clear and Coherent  Volume:  Normal  Mood:  "beter"  Affect:  Appropriate, Congruent and Full Range  Thought Process:  Coherent  Orientation:  Full (Time, Place, and Person)  Thought Content: Logical   Suicidal Thoughts:  No  Homicidal Thoughts:  No  Memory:  Immediate;   Good  Judgement:  Good  Insight:  Fair  Psychomotor Activity:  Normal  Concentration:  Concentration: Good and Attention Span: Good  Recall:  Good  Fund of Knowledge: Good  Language: Good  Akathisia:  No  Handed:  Right  AIMS (if indicated): not done  Assets:  Communication Skills Desire for Improvement  ADL's:  Intact  Cognition: WNL  Sleep:  Fair   Screenings:   Assessment and Plan:  [name] is a 63 y.o. year old female with a history of  depression, COPD, hypertension, who presents for follow up appointment for depression.   # MDD, recurrent in partial remission There has been significant improvement in depressive symptoms after up titration of bupropion (and also coincided with recent birth of her grand daughter.).  Psychosocial stressors includes loss of her husband, and her son, who is in jail.  Will continue current medication regimen.  Will continue Lexapro to target depression.  Will continue bupropion as adjunctive treatment for depression.  Discussed potential risk of seizure.  Will continue Lorazepam as needed for anxiety.  Discussed risk of dependence and oversedation, especially with concomitant use of opioid.  Will continue trazodone as needed for insomnia.  Discussed behavioral activation.   Plan I have reviewed and updated plans as below 1. Continue lexapro 20 mg daily 2. Continue bupropion 450 mg (300 mg + 150 mg) daily  3.Continuelorazepam 1mg  dailyas needed for anxiety, 08/25/2018  4.Continuetrazodone 25-50 mg at night as needed for sleep 5.Next appointment: 11/2 at 2 PM for 20 mins, video - checked TSH by PCP in 01/2017; wnl per self report  The patient demonstrates the following risk factors for suicide: Chronic risk factors for suicide include:psychiatric disorder ofdepression, anxiety. Acute risk factorsfor suicide include: loss (financial, interpersonal, professional). Protective factorsfor this patient include: positive social support, responsibility to others (children, family), coping skills and hope for the future. Considering these factors, the overall suicide risk at this point appears to below. Patientisappropriate for outpatient follow up.  Norman Clay, MD 09/19/2018, 3:43 PM

## 2018-09-19 ENCOUNTER — Encounter (HOSPITAL_COMMUNITY): Payer: Self-pay | Admitting: Psychiatry

## 2018-09-19 ENCOUNTER — Other Ambulatory Visit: Payer: Self-pay

## 2018-09-19 ENCOUNTER — Ambulatory Visit (INDEPENDENT_AMBULATORY_CARE_PROVIDER_SITE_OTHER): Payer: 59 | Admitting: Psychiatry

## 2018-09-19 DIAGNOSIS — F3341 Major depressive disorder, recurrent, in partial remission: Secondary | ICD-10-CM | POA: Diagnosis not present

## 2018-09-19 MED ORDER — ESCITALOPRAM OXALATE 20 MG PO TABS: 20.0000 mg | ORAL_TABLET | Freq: Every day | ORAL | 0 refills | Status: DC

## 2018-09-19 MED ORDER — BUPROPION HCL ER (XL) 300 MG PO TB24: 300.0000 mg | ORAL_TABLET | Freq: Every day | ORAL | 0 refills | Status: DC

## 2018-09-19 MED ORDER — TRAZODONE HCL 50 MG PO TABS: 50.0000 mg | ORAL_TABLET | Freq: Every evening | ORAL | 0 refills | Status: DC | PRN

## 2018-09-19 MED ORDER — BUPROPION HCL ER (XL) 150 MG PO TB24: ORAL_TABLET | ORAL | 0 refills | Status: DC

## 2018-09-19 MED ORDER — LORAZEPAM 1 MG PO TABS: 1.0000 mg | ORAL_TABLET | Freq: Every day | ORAL | 2 refills | Status: DC | PRN

## 2018-09-19 NOTE — Patient Instructions (Signed)
1. Continue lexapro 20 mg daily 2. Continue bupropion 450 mg (300 mg + 150 mg) daily  3.Continuelorazepam 1mg  dailyas needed for anxiety, 08/25/2018  4.Continuetrazodone 25-50 mg at night as needed for sleep 5.Next appointment: 11/2 at 2 PM

## 2018-12-02 NOTE — Progress Notes (Signed)
Virtual Visit via Video Note  I connected with [name] on 12/09/18 at  2:00 PM EST by a video enabled telemedicine application and verified that I am speaking with the correct person using two identifiers.   I discussed the limitations of evaluation and management by telemedicine and the availability of in person appointments. The patient expressed understanding and agreed to proceed.     I discussed the assessment and treatment plan with the patient. The patient was provided an opportunity to ask questions and all were answered. The patient agreed with the plan and demonstrated an understanding of the instructions.   The patient was advised to call back or seek an in-person evaluation if the symptoms worsen or if the condition fails to improve as anticipated.  I provided 15 minutes of non-face-to-face time during this encounter.   Faith Hotter, MD    [facility] MD/PA/NP OP Progress Note  12/09/2018 2:19 PM [name]  MRN:  [id number]  Chief Complaint:  Chief Complaint    Depression; Follow-up     HPI:  This is a follow-up appointment for depression.  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 She has been feeling stressed at work and it has been overwhelming for her. She also felt frustrated about her salary while she works for more than 60 hours. She is starting vacation today, and is planning to see her son in jail in [place]. She is looking forward to seeing him as she has not been able to meet with him since January due to pandemic.  Her granddaughter and her daughter in law stays with the patient. She reports good connection with her granddaughter. She tends to feel "blue" in holiday season. She misses her husband. She feels that it is not the same, referring that they used to meet with her husband's side of large family. She wants to be "happy" and is willing to see a therapist.  She  sleeps better.  She has occasional anhedonia and low energy.  She has poor concentration.  She has occasionally decreased appetite.  She denies SI.  She feels anxious and sometimes.  She denies panic attacks.    Visit Diagnosis:    ICD-10-CM   1. MDD (major depressive disorder), recurrent episode, mild (HCC)  F33.0     Past Psychiatric History: Please see initial evaluation for full details. I have reviewed the history. No updates at this time.     Past Medical History:  Past Medical History:  Diagnosis Date  . Anxiety   . Arthritis   . Complication of anesthesia   . Depression   . Hypertension   . PONV (postoperative nausea and vomiting)     Past Surgical History:  Procedure Laterality Date  . FOOT NEUROMA SURGERY     right foot  . KNEE SURGERY     right arthroscopic  . 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 TOTAL KNEE ARTHROPLASTY Right 04/09/2018   Procedure: TOTAL KNEE ARTHROPLASTY;  Surgeon: Durene Romans, MD;  Location: [facility];  Service: Orthopedics;  Laterality: Right;  70 mins    Family Psychiatric History: Please see initial evaluation for full details. I have reviewed the history. No updates at this time.     Family History:  Family History  Problem Relation Age of Onset  . Drug abuse Son     Social History:  Social History   Socioeconomic History  . Marital status: Widowed    Spouse name: Not on file  . Number of children: Not on file  . Years of education: Not on file  . Highest education level: Not on file  Occupational History  . Not on file  Social Needs  . Financial resource strain: Not on file  . Food insecurity    Worry: Not on file    Inability: Not on file  . Transportation needs    Medical: Not on file    Non-medical: Not on file  Tobacco Use  . Smoking status: Current Every Day Smoker     Packs/day: 0.25    Years: 40.00    Pack years: 10.00    Types: Cigarettes    Start date: 10/15/2017  . Smokeless tobacco: Never Used  . Tobacco comment: smokes 2 cigarettes a day  Substance and Sexual Activity  . Alcohol use: Yes    Comment: occasional. 11-15-2016 occa  . Drug use: No    Comment: 11-15-2016 per pt no  . Sexual activity: Not Currently  Lifestyle  . Physical activity    Days per week: Not on file    Minutes per session: Not on file  . Stress: Not on file  Relationships  . Social Musicianconnections    Talks on phone: Not on file    Gets together: Not on file    Attends religious service: Not on file    Active member of club or organization: Not on file    Attends meetings of clubs or organizations: Not on file    Relationship status: Not on file  Other Topics Concern  . Not on file  Social History Narrative  . Not on file    Allergies:  Allergies  Allergen Reactions  . Percocet [Oxycodone-Acetaminophen] Nausea And Vomiting    Metabolic Disorder Labs: No results found for: HGBA1C, MPG No results found for: PROLACTIN No results found for: CHOL, TRIG, HDL, CHOLHDL, VLDL, LDLCALC  Therapeutic Level Labs: No results found for: LITHIUM No results found for: VALPROATE No components found for:  CBMZ  Current Medications: Current Outpatient Medications  Medication Sig Dispense Refill  . amLODipine (NORVASC) 10 MG tablet Take 10 mg by mouth daily.     Melene Muller. [START ON 01/15/2019] buPROPion (WELLBUTRIN XL) 150 MG 24 hr tablet Total of 450 mg daily (300 mg + 150 mg) 90 tablet 1  . [START ON 01/16/2019] buPROPion (WELLBUTRIN XL) 300 MG 24 hr tablet Take 1 tablet (300 mg total) by mouth daily. 90 tablet 1  . celecoxib (CELEBREX) 100 MG capsule Take 100 mg by mouth 2 (two) times daily.    Marland Kitchen. docusate sodium (COLACE) 100 MG capsule Take 1 capsule (100 mg total) by mouth 2 (two) times daily. 10 capsule 0  . [START ON 01/16/2019] escitalopram (LEXAPRO) 20 MG tablet Take 1 tablet  (20 mg total) by mouth daily. 90 tablet 1  . ferrous sulfate (FERROUSUL) 325 (65 FE) MG tablet Take 1 tablet (325 mg total) by mouth 3 (three) times daily with meals.  3  . HYDROcodone-acetaminophen (NORCO) 7.5-325 MG tablet Take 1-2 tablets by mouth every 4 (four) hours as needed for moderate pain. 60 tablet 0  . LORazepam (ATIVAN) 1 MG tablet Take 1 tablet (1 mg total) by mouth daily as needed for anxiety. 30 tablet 3  . methocarbamol (ROBAXIN) 500  MG tablet Take 1 tablet (500 mg total) by mouth every 6 (six) hours as needed for muscle spasms. 40 tablet 0  . metoprolol tartrate (LOPRESSOR) 25 MG tablet Take 25 mg by mouth 2 (two) times daily.    . polyethylene glycol (MIRALAX / GLYCOLAX) packet Take 17 g by mouth 2 (two) times daily. 14 each 0  . [START ON 01/16/2019] traZODone (DESYREL) 50 MG tablet Take 1 tablet (50 mg total) by mouth at bedtime as needed for sleep. 90 tablet 1   No current facility-administered medications for this visit.      Musculoskeletal: Strength & Muscle Tone: Good Gait & Station: N/A Patient leans: N/A  Psychiatric Specialty Exam: Review of Systems  Psychiatric/Behavioral: Positive for depression. Negative for hallucinations, memory loss, substance abuse and suicidal ideas. The patient is nervous/anxious. The patient does not have insomnia.   All other systems reviewed and are negative.   There were no vitals taken for this visit.There is no height or weight on file to calculate BMI.  General Appearance: Fairly Groomed  Eye Contact:  Good  Speech:  Clear and Coherent  Volume:  Normal  Mood:  Depressed  Affect:  Appropriate, Congruent and down at times, reactive  Thought Process:  Coherent  Orientation:  Full (Time, Place, and Person)  Thought Content: Logical   Suicidal Thoughts:  No  Homicidal Thoughts:  No  Memory:  Immediate;   Good  Judgement:  Good  Insight:  Good  Psychomotor Activity:  Normal  Concentration:  Concentration: Good and  Attention Span: Good  Recall:  Good  Fund of Knowledge: Good  Language: Good  Akathisia:  No  Handed:  Right  AIMS (if indicated): not done  Assets:  Communication Skills Desire for Improvement  ADL's:  Intact  Cognition: WNL  Sleep:  Good   Screenings:   Assessment and Plan:  AURA BIBBY is a 63 y.o. year old female with a history of depression, COPD, hypertension , who presents for follow up appointment for MDD (major depressive disorder), recurrent episode, mild ([place])  # MDD, recurrent, mild She reports occasional depressive symptoms in the context of upcoming holiday, and grief of the loss of her husband in March 2018.  Other psychosocial stressors include this her son, who is in jail.  Would continue Lexapro to target depression.  We will continue bupropion as adjunctive treatment for depression.  She has no known history of seizure.  We will continue lorazepam as needed for anxiety.  Discussed risk of dependence and oversedation, especially with concomitant use of opioid.  Will continue trazodone as needed for insomnia.  Validated her grief.  She will greatly benefit from CBT/supportive therapy; will make a referral.   Plan I have reviewed and updated plans as below 1. Continue lexapro 20 mg daily 2. Continue bupropion450 mg(300 mg + 150 mg)daily  3.Continuelorazepam 1mg  dailyas needed for anxiety 4.Continuetrazodone 25-50 mg at night as needed for sleep 5.Next appointment: in January 6. Referral to therapy - checked TSH by PCP in 01/2017; wnl per self report  The patient demonstrates the following risk factors for suicide: Chronic risk factors for suicide include:psychiatric disorder ofdepression, anxiety. Acute risk factorsfor suicide include: loss (financial, interpersonal, professional). Protective factorsfor this patient include: positive social support, responsibility to others (children, family), coping skills and hope for the future. Considering  these factors, the overall suicide risk at this point appears to below. Patientisappropriate for outpatient follow up.  Norman Clay, MD 12/09/2018, 2:19 PM

## 2018-12-09 ENCOUNTER — Ambulatory Visit (INDEPENDENT_AMBULATORY_CARE_PROVIDER_SITE_OTHER): Payer: 59 | Admitting: Psychiatry

## 2018-12-09 ENCOUNTER — Encounter (HOSPITAL_COMMUNITY): Payer: Self-pay | Admitting: Psychiatry

## 2018-12-09 ENCOUNTER — Other Ambulatory Visit: Payer: Self-pay

## 2018-12-09 DIAGNOSIS — F330 Major depressive disorder, recurrent, mild: Secondary | ICD-10-CM | POA: Diagnosis not present

## 2018-12-09 MED ORDER — BUPROPION HCL ER (XL) 150 MG PO TB24: ORAL_TABLET | ORAL | 1 refills | Status: DC

## 2018-12-09 MED ORDER — ESCITALOPRAM OXALATE 20 MG PO TABS: 20.0000 mg | ORAL_TABLET | Freq: Every day | ORAL | 1 refills | Status: DC

## 2018-12-09 MED ORDER — LORAZEPAM 1 MG PO TABS: 1.0000 mg | ORAL_TABLET | Freq: Every day | ORAL | 3 refills | Status: DC | PRN

## 2018-12-09 MED ORDER — BUPROPION HCL ER (XL) 300 MG PO TB24: 300.0000 mg | ORAL_TABLET | Freq: Every day | ORAL | 1 refills | Status: DC

## 2018-12-09 MED ORDER — TRAZODONE HCL 50 MG PO TABS: 50.0000 mg | ORAL_TABLET | Freq: Every evening | ORAL | 1 refills | Status: DC | PRN

## 2018-12-09 NOTE — Patient Instructions (Signed)
1. Continue lexapro 20 mg daily 2. Continue bupropion450 mg(300 mg + 150 mg)daily  3.Continuelorazepam 1mg  dailyas needed for anxiety 4.Continuetrazodone 25-50 mg at night as needed for sleep 5.Next appointment: in January 6. Referral to therapy

## 2019-02-24 ENCOUNTER — Ambulatory Visit (HOSPITAL_COMMUNITY): Payer: 59 | Admitting: Psychiatry

## 2019-03-13 ENCOUNTER — Encounter: Payer: Self-pay | Admitting: Psychiatry

## 2019-03-13 ENCOUNTER — Ambulatory Visit (INDEPENDENT_AMBULATORY_CARE_PROVIDER_SITE_OTHER): Payer: 59 | Admitting: Psychiatry

## 2019-03-13 ENCOUNTER — Other Ambulatory Visit: Payer: Self-pay

## 2019-03-13 DIAGNOSIS — F3341 Major depressive disorder, recurrent, in partial remission: Secondary | ICD-10-CM

## 2019-03-13 MED ORDER — LORAZEPAM 1 MG PO TABS: 1.0000 mg | ORAL_TABLET | Freq: Every day | ORAL | 1 refills | Status: DC | PRN

## 2019-03-13 MED ORDER — ESCITALOPRAM OXALATE 20 MG PO TABS: 20.0000 mg | ORAL_TABLET | Freq: Every day | ORAL | 0 refills | Status: DC

## 2019-03-13 MED ORDER — ARIPIPRAZOLE 2 MG PO TABS: 2.0000 mg | ORAL_TABLET | Freq: Every day | ORAL | 0 refills | Status: DC

## 2019-03-13 NOTE — Progress Notes (Signed)
BH MD OP Progress Note  Virtual Visit via Telephone Note  I connected with [name] on 03/13/19 at  1:00 PM EST by telephone and verified that I am speaking with the correct person using two identifiers.    I discussed the limitations, risks, security and privacy concerns of performing an evaluation and management service by telephone and the availability of in person appointments. I also discussed with the patient that there may be a patient responsible charge related to this service. The patient expressed understanding and agreed to proceed.   03/13/2019 1:38 PM [name]  MRN:  [id number]  Chief Complaint:  " My depression has been bad since Christmas."  HPI: Patient reported that she has been quite depressed since Christmas.  She stated that she just comes to work and then goes straight home and goes to bed almost immediately.  She stated that she spends her weekends in bed as she has no desire to do anything.  She stated that her energy levels are almost non-existent and she endorses anhedonia.  She has no appetite.  She was taking trazodone for sleep but she felt too strong so she stopped taking it for a month or so.  She uses it sometimes but when she uses it she sleeps excessively the next morning.  She sometimes uses lorazepam 1 mg in the evening time to go to bed may use it 3-4 times per week. She denied any suicidal ideations or intent. She stated that she does not think Wellbutrin is doing much for her.  She was agreeable to tapering it off and doing a trial of Abilify to help her mood along with Lexapro.   Visit Diagnosis:    ICD-10-CM   1. MDD (major depressive disorder), recurrent, in partial remission (HCC)  F33.41     Past Psychiatric History:   Past Medical History:  Past Medical History:  Diagnosis Date  . Anxiety   . Arthritis   . Complication of anesthesia   . Depression   . Hypertension   . PONV (postoperative nausea and vomiting)     Past  Surgical History:  Procedure Laterality Date  . FOOT NEUROMA SURGERY     right foot  . KNEE SURGERY     right arthroscopic  . 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 TOTAL KNEE ARTHROPLASTY Right 04/09/2018   Procedure: TOTAL KNEE ARTHROPLASTY;  Surgeon: Durene Romans, MD;  Location: [facility];  Service: Orthopedics;  Laterality: Right;  70 mins    Family Psychiatric History:see below  Family History:  Family History  Problem Relation Age of Onset  . Drug abuse Son     Social History:  Social History   Socioeconomic History  . Marital status: Widowed    Spouse name: Not on file  . Number of children: Not on file  . Years of education: Not on file  . Highest education level: Not on file  Occupational History  . Not on file  Tobacco Use  . Smoking status: Current Every Day Smoker    Packs/day: 0.25    Years: 40.00    Pack years: 10.00    Types: Cigarettes    Start date: 10/15/2017  . Smokeless tobacco: Never Used  . Tobacco comment: smokes 2 cigarettes a day  Substance and Sexual Activity  . Alcohol use: Yes  Comment: occasional. 11-15-2016 occa  . Drug use: No    Comment: 11-15-2016 per pt no  . Sexual activity: Not Currently  Other Topics Concern  . Not on file  Social History Narrative  . Not on file   Social Determinants of Health   Financial Resource Strain:   . Difficulty of Paying Living Expenses: Not on file  Food Insecurity:   . Worried About Programme researcher, broadcasting/film/video in the Last Year: Not on file  . Ran Out of Food in the Last Year: Not on file  Transportation Needs:   . Lack of Transportation (Medical): Not on file  . Lack of Transportation (Non-Medical): Not on file  Physical Activity:   . Days of Exercise per Week: Not on file  . Minutes of Exercise per Session: Not on file  Stress:   . Feeling of Stress : Not on  file  Social Connections:   . Frequency of Communication with Friends and Family: Not on file  . Frequency of Social Gatherings with Friends and Family: Not on file  . Attends Religious Services: Not on file  . Active Member of Clubs or Organizations: Not on file  . Attends Banker Meetings: Not on file  . Marital Status: Not on file    Allergies:  Allergies  Allergen Reactions  . Percocet [Oxycodone-Acetaminophen] Nausea And Vomiting    Metabolic Disorder Labs: No results found for: HGBA1C, MPG No results found for: PROLACTIN No results found for: CHOL, TRIG, HDL, CHOLHDL, VLDL, LDLCALC   Therapeutic Level Labs: No results found for: LITHIUM No results found for: VALPROATE No components found for:  CBMZ  Current Medications: Current Outpatient Medications  Medication Sig Dispense Refill  . amLODipine (NORVASC) 10 MG tablet Take 10 mg by mouth daily.     Marland Kitchen buPROPion (WELLBUTRIN XL) 150 MG 24 hr tablet Total of 450 mg daily (300 mg + 150 mg) 90 tablet 1  . buPROPion (WELLBUTRIN XL) 300 MG 24 hr tablet Take 1 tablet (300 mg total) by mouth daily. 90 tablet 1  . celecoxib (CELEBREX) 100 MG capsule Take 100 mg by mouth 2 (two) times daily.    Marland Kitchen docusate sodium (COLACE) 100 MG capsule Take 1 capsule (100 mg total) by mouth 2 (two) times daily. 10 capsule 0  . escitalopram (LEXAPRO) 20 MG tablet Take 1 tablet (20 mg total) by mouth daily. 90 tablet 1  . ferrous sulfate (FERROUSUL) 325 (65 FE) MG tablet Take 1 tablet (325 mg total) by mouth 3 (three) times daily with meals.  3  . HYDROcodone-acetaminophen (NORCO) 7.5-325 MG tablet Take 1-2 tablets by mouth every 4 (four) hours as needed for moderate pain. 60 tablet 0  . LORazepam (ATIVAN) 1 MG tablet Take 1 tablet (1 mg total) by mouth daily as needed for anxiety. 30 tablet 3  . methocarbamol (ROBAXIN) 500 MG tablet Take 1 tablet (500 mg total) by mouth every 6 (six) hours as needed for muscle spasms. 40 tablet 0  .  metoprolol tartrate (LOPRESSOR) 25 MG tablet Take 25 mg by mouth 2 (two) times daily.    . polyethylene glycol (MIRALAX / GLYCOLAX) packet Take 17 g by mouth 2 (two) times daily. 14 each 0  . traZODone (DESYREL) 50 MG tablet Take 1 tablet (50 mg total) by mouth at bedtime as needed for sleep. 90 tablet 1   No current facility-administered medications for this visit.      Psychiatric Specialty Exam: Review of Systems  There were no vitals taken for this visit.There is no height or weight on file to calculate BMI.  General Appearance: Fairly Groomed  Eye Contact:  Good  Speech:  Clear and Coherent and Normal Rate  Volume:  Normal  Mood:  Depressed  Affect:  Congruent  Thought Process:  Goal Directed and Descriptions of Associations: Intact  Orientation:  Full (Time, Place, and Person)  Thought Content: Logical   Suicidal Thoughts:  No  Homicidal Thoughts:  No  Memory:  Immediate;   Good Recent;   Good  Judgement:  Fair  Insight:  Fair  Psychomotor Activity:  Normal  Concentration:  Concentration: Good and Attention Span: Good  Recall:  Good  Fund of Knowledge: Good  Language: Good  Akathisia:  Negative  Handed:  Right  AIMS (if indicated): not done  Assets:  Communication Skills Desire for Improvement Financial Resources/Insurance Housing  ADL's:  Intact  Cognition: WNL  Sleep:  Fair    Assessment and Plan: 64 year old female with history of depression, COPD, hypertension who was contacted via phone for follow-up.  Patient reported ongoing depressive symptoms.  She was willing to try Abilify for augmentation of Lexapro.  She does not think Wellbutrin is doing much so would taper it off. Potential side effects of medication and risks vs benefits of treatment vs non-treatment were explained and discussed. All questions were answered.  1. MDD (major depressive disorder), recurrent, in partial remission (HCC)  - Continue escitalopram (LEXAPRO) 20 MG tablet; Take 1 tablet (20  mg total) by mouth daily.  Dispense: 90 tablet; Refill: 0 - LORazepam (ATIVAN) 1 MG tablet; Take 1 tablet (1 mg total) by mouth daily as needed for anxiety.  Dispense: 30 tablet; Refill: 1 - Start ARIPiprazole (ABILIFY) 2 MG tablet; Take 1 tablet (2 mg total) by mouth daily.  Dispense: 90 tablet; Refill: 0 - Taper off Wellbutrin XL to 300 mg for 5 days then 150 mg for 5 days then stop.  F/up in 2 months.  Nevada Crane, MD 03/13/2019, 1:38 PM

## 2019-05-07 NOTE — Progress Notes (Signed)
Virtual Visit via Video Note  I connected with [name] on 05/13/19 at  1:00 PM EDT by a video enabled telemedicine application and verified that I am speaking with the correct person using two identifiers.   I discussed the limitations of evaluation and management by telemedicine and the availability of in person appointments. The patient expressed understanding and agreed to proceed.     I discussed the assessment and treatment plan with the patient. The patient was provided an opportunity to ask questions and all were answered. The patient agreed with the plan and demonstrated an understanding of the instructions.   The patient was advised to call back or seek an in-person evaluation if the symptoms worsen or if the condition fails to improve as anticipated.  I provided 15 minutes of non-face-to-face time during this encounter.   Norman Clay, MD    [facility] MD/PA/NP OP Progress Note  05/13/2019 1:14 PM [name]  MRN:  [id number]  Chief Complaint:  Chief Complaint    Depression; Follow-up     HPI:  - bupropion was tapered off, and Abilify was started at the visit with Dr. Toy Care  This is a follow-up appointment for depression.  She states that she has been feeling much better after starting Abilify.  Although she used to staying sad and did not have any ambition, she now feels more energy to do things.  She had a great time at the birthday party of her great niece on Easter.  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  She feels content with this change as she was very stressed being a Freight forwarder.  She has middle insomnia.  She feels less depressed.  She has a fair concentration.  She denies SI.  She feels anxious and tense at times.  She denies panic attacks.  She reports increased appetite and weight gain, which she attributes to being on prednisone for bronchitis.  She agrees to measure weight every week.    Visit Diagnosis:    ICD-10-CM   1. MDD (major depressive disorder), recurrent episode, mild (HCC)  F33.0 ARIPiprazole (ABILIFY) 5 MG tablet    escitalopram (LEXAPRO) 20 MG tablet    Past Psychiatric History: Please see initial evaluation for full details. I have reviewed the history. No updates at this time.     Past Medical History:  Past Medical History:  Diagnosis Date  . Anxiety   . Arthritis   . Complication of anesthesia   . Depression   . Hypertension   . PONV (postoperative nausea and vomiting)     Past Surgical History:  Procedure Laterality Date  . FOOT NEUROMA SURGERY     right foot  . KNEE SURGERY     right arthroscopic  . TOTAL KNEE ARTHROPLASTY Left 03/05/2018   Procedure: TOTAL KNEE ARTHROPLASTY;  Surgeon: Paralee Cancel, MD;  Location: [facility];  Service: Orthopedics;  Laterality: Left;  70 minutes  . TOTAL KNEE ARTHROPLASTY Right 04/09/2018   Procedure: TOTAL KNEE ARTHROPLASTY;  Surgeon: Paralee Cancel, MD;  Location: [facility];  Service: Orthopedics;  Laterality: Right;  70 mins    Family Psychiatric History: Please see initial evaluation for full details. I have reviewed the history. No updates at this time.     Family History:  Family History  Problem Relation Age of Onset  . Drug abuse Son     Social History:  Social History   Socioeconomic History  . Marital  status: Widowed    Spouse name: Not on file  . Number of children: Not on file  . Years of education: Not on file  . Highest education level: Not on file  Occupational History  . Not on file  Tobacco Use  . Smoking status: Current Every Day Smoker    Packs/day: 0.25    Years: 40.00    Pack years: 10.00    Types: Cigarettes    Start date: 10/15/2017  . Smokeless tobacco: Never Used  . Tobacco comment: smokes 2 cigarettes a day  Substance and Sexual Activity  . Alcohol use: Yes    Comment: occasional. 11-15-2016 occa  . Drug use: No    Comment: 11-15-2016 per pt no  . Sexual activity: Not Currently  Other  Topics Concern  . Not on file  Social History Narrative  . Not on file   Social Determinants of Health   Financial Resource Strain:   . Difficulty of Paying Living Expenses:   Food Insecurity:   . Worried About Programme researcher, broadcasting/film/video in the Last Year:   . Barista in the Last Year:   Transportation Needs:   . Freight forwarder (Medical):   Marland Kitchen Lack of Transportation (Non-Medical):   Physical Activity:   . Days of Exercise per Week:   . Minutes of Exercise per Session:   Stress:   . Feeling of Stress :   Social Connections:   . Frequency of Communication with Friends and Family:   . Frequency of Social Gatherings with Friends and Family:   . Attends Religious Services:   . Active Member of Clubs or Organizations:   . Attends Banker Meetings:   Marland Kitchen Marital Status:     Allergies:  Allergies  Allergen Reactions  . Percocet [Oxycodone-Acetaminophen] Nausea And Vomiting    Metabolic Disorder Labs: No results found for: HGBA1C, MPG No results found for: PROLACTIN No results found for: CHOL, TRIG, HDL, CHOLHDL, VLDL, LDLCALC  Therapeutic Level Labs: No results found for: LITHIUM No results found for: VALPROATE No components found for:  CBMZ  Current Medications: Current Outpatient Medications  Medication Sig Dispense Refill  . amLODipine (NORVASC) 10 MG tablet Take 10 mg by mouth daily.     . ARIPiprazole (ABILIFY) 5 MG tablet Take 1 tablet (5 mg total) by mouth daily. 90 tablet 0  . celecoxib (CELEBREX) 100 MG capsule Take 100 mg by mouth 2 (two) times daily.    Marland Kitchen docusate sodium (COLACE) 100 MG capsule Take 1 capsule (100 mg total) by mouth 2 (two) times daily. 10 capsule 0  . [START ON 06/10/2019] escitalopram (LEXAPRO) 20 MG tablet Take 1 tablet (20 mg total) by mouth daily. 90 tablet 0  . ferrous sulfate (FERROUSUL) 325 (65 FE) MG tablet Take 1 tablet (325 mg total) by mouth 3 (three) times daily with meals.  3  . HYDROcodone-acetaminophen (NORCO)  7.5-325 MG tablet Take 1-2 tablets by mouth every 4 (four) hours as needed for moderate pain. 60 tablet 0  . LORazepam (ATIVAN) 1 MG tablet Take 1 tablet (1 mg total) by mouth daily as needed for anxiety. 30 tablet 1  . methocarbamol (ROBAXIN) 500 MG tablet Take 1 tablet (500 mg total) by mouth every 6 (six) hours as needed for muscle spasms. 40 tablet 0  . metoprolol tartrate (LOPRESSOR) 25 MG tablet Take 25 mg by mouth 2 (two) times daily.    . polyethylene glycol (MIRALAX / GLYCOLAX) packet Take  17 g by mouth 2 (two) times daily. 14 each 0  . traZODone (DESYREL) 50 MG tablet Take 1 tablet (50 mg total) by mouth at bedtime as needed for sleep. 90 tablet 1   No current facility-administered medications for this visit.     Musculoskeletal: Strength & Muscle Tone: N/A Gait & Station: N/A Patient leans: N/A  Psychiatric Specialty Exam: Review of Systems  Psychiatric/Behavioral: Positive for dysphoric mood and sleep disturbance. Negative for agitation, behavioral problems, confusion, decreased concentration, hallucinations, self-injury and suicidal ideas. The patient is nervous/anxious. The patient is not hyperactive.   All other systems reviewed and are negative.   There were no vitals taken for this visit.There is no height or weight on file to calculate BMI.  General Appearance: Fairly Groomed  Eye Contact:  Good  Speech:  Clear and Coherent  Volume:  Normal  Mood:  better  Affect:  Appropriate, Congruent and brighter  Thought Process:  Coherent  Orientation:  Full (Time, Place, and Person)  Thought Content: Logical   Suicidal Thoughts:  No  Homicidal Thoughts:  No  Memory:  Immediate;   Good  Judgement:  Good  Insight:  Good  Psychomotor Activity:  Normal  Concentration:  Concentration: Good and Attention Span: Good  Recall:  Good  Fund of Knowledge: Good  Language: Good  Akathisia:  No  Handed:  Right  AIMS (if indicated): not done  Assets:  Communication Skills Desire  for Improvement  ADL's:  Intact  Cognition: WNL  Sleep:  Poor   Screenings:   Assessment and Plan:  AVERI KILTY is a 64 y.o. year old female with a history of depression, COPD, hypertension , who presents for follow up appointment for MDD (major depressive disorder), recurrent episode, mild (HCC) - Plan: ARIPiprazole (ABILIFY) 5 MG tablet, escitalopram (LEXAPRO) 20 MG tablet  # MDD, recurrent, mild There has been steady improvement in depressive symptoms since switching from bupropion to Abilify.  This also coincided with her changing the position at work.  Psychosocial stressors includes grief of loss of her husband in March 2018, and her son, who is in jail.  Will uptitrate Abilify as adjunctive treatment for depression.  Discussed potential risk of weight gain and EPS.  Will continue Lexapro to target depression.  Will continue lorazepam as needed for anxiety.  Discussed risk of dependence and oversedation, especially with concomitant use of opioid.   Plan I have reviewed and updated plans as below 1. Continue lexapro 20 mg daily 2.Increase Abilify 5 mg daily  3. Next appointment- 6/8 at 1 PM for 20 mins, video 4. Please measure your weight weekly  - she discontinued Trazodone - checked TSH by PCP in 01/2017; wnl per self report  The patient demonstrates the following risk factors for suicide: Chronic risk factors for suicide include:psychiatric disorder ofdepression, anxiety. Acute risk factorsfor suicide include: loss (financial, interpersonal, professional). Protective factorsfor this patient include: positive social support, responsibility to others (children, family), coping skills and Faith for the future. Considering these factors, the overall suicide risk at this point appears to below. Patientisappropriate for outpatient follow up.  Neysa Hotter, MD 05/13/2019, 1:14 PM

## 2019-05-13 ENCOUNTER — Ambulatory Visit (INDEPENDENT_AMBULATORY_CARE_PROVIDER_SITE_OTHER): Payer: 59 | Admitting: Psychiatry

## 2019-05-13 ENCOUNTER — Other Ambulatory Visit: Payer: Self-pay

## 2019-05-13 ENCOUNTER — Encounter (HOSPITAL_COMMUNITY): Payer: Self-pay | Admitting: Psychiatry

## 2019-05-13 DIAGNOSIS — F330 Major depressive disorder, recurrent, mild: Secondary | ICD-10-CM

## 2019-05-13 MED ORDER — ESCITALOPRAM OXALATE 20 MG PO TABS: 20.0000 mg | ORAL_TABLET | Freq: Every day | ORAL | 0 refills | Status: DC

## 2019-05-13 MED ORDER — ARIPIPRAZOLE 5 MG PO TABS: 5.0000 mg | ORAL_TABLET | Freq: Every day | ORAL | 0 refills | Status: DC

## 2019-05-13 NOTE — Patient Instructions (Signed)
1. Continue lexapro 20 mg daily 2.Increase Abilify 5 mg daily  3. Next appointment- 6/8 at 1 PM  4. Please measure your weight weekly

## 2019-07-04 ENCOUNTER — Telehealth: Payer: Self-pay

## 2019-07-04 ENCOUNTER — Other Ambulatory Visit (HOSPITAL_COMMUNITY): Payer: Self-pay | Admitting: Psychiatry

## 2019-07-04 DIAGNOSIS — F3341 Major depressive disorder, recurrent, in partial remission: Secondary | ICD-10-CM

## 2019-07-04 MED ORDER — LORAZEPAM 1 MG PO TABS: 1.0000 mg | ORAL_TABLET | Freq: Every day | ORAL | 0 refills | Status: DC | PRN

## 2019-07-04 NOTE — Telephone Encounter (Signed)
received a fax requesting a refill on the lorazepam 1mg 

## 2019-07-04 NOTE — Telephone Encounter (Signed)
Ordered

## 2019-07-12 NOTE — Progress Notes (Signed)
Virtual Visit via Video Note  I connected with [name] on 07/15/19 at  1:00 PM EDT by a video enabled telemedicine application and verified that I am speaking with the correct person using two identifiers.   I discussed the limitations of evaluation and management by telemedicine and the availability of in person appointments. The patient expressed understanding and agreed to proceed.     I discussed the assessment and treatment plan with the patient. The patient was provided an opportunity to ask questions and all were answered. The patient agreed with the plan and demonstrated an understanding of the instructions.   The patient was advised to call back or seek an in-person evaluation if the symptoms worsen or if the condition fails to improve as anticipated.  I provided 12 minutes of non-face-to-face time during this encounter.   Location: patient- home, provider- home office   Neysa Hotter, MD    [facility] MD/PA/NP OP Progress Note  07/15/2019 1:19 PM [name]  MRN:  [id number]  Chief Complaint:  Chief Complaint    Depression; Follow-up; Anxiety     HPI:  This is a follow-up appointment for depression and anxiety.  She states that she feels fatigued when she wakes up.  It has been difficult for her to motivate to do anything.  She tends to lie down when she does not go to work, while she is able to make herself go to work every day.  Although she meets with her coworkers every couple of weeks, she feels exhausted afterwards.  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  She sleeps better after starting melatonin.  She has fair concentration.  She has good appetite.  She has not noticed any weight change.  She denies SI.  She feels anxious at times and takes lorazepam.  She takes it a few times per week as it makes her feel drowsy the next day. She denies panic attacks.   168 lbs/no change lately Wt Readings from Last 3 Encounters:   04/18/18 159 lb (72.1 kg)  04/09/18 159 lb 2.8 oz (72.2 kg)  04/04/18 159 lb 2 oz (72.2 kg)    Visit Diagnosis:    ICD-10-CM   1. MDD (major depressive disorder), recurrent episode, mild (HCC)  F33.0 ARIPiprazole (ABILIFY) 10 MG tablet  2. Anxiety  F41.9 LORazepam (ATIVAN) 0.5 MG tablet    Past Psychiatric History: Please see initial evaluation for full details. I have reviewed the history. No updates at this time.     Past Medical History:  Past Medical History:  Diagnosis Date  . Anxiety   . Arthritis   . Complication of anesthesia   . Depression   . Hypertension   . PONV (postoperative nausea and vomiting)     Past Surgical History:  Procedure Laterality Date  . FOOT NEUROMA SURGERY     right foot  . KNEE SURGERY     right arthroscopic  . 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 TOTAL KNEE ARTHROPLASTY Right 04/09/2018   Procedure: TOTAL KNEE ARTHROPLASTY;  Surgeon: Durene Romans, MD;  Location: [facility];  Service: Orthopedics;  Laterality: Right;  70 mins    Family Psychiatric History: Please see initial evaluation for full details. I have reviewed the history. No updates at this time.     Family History:  Family History  Problem Relation Age of Onset  . Drug abuse Son     Social History:  Social History   Socioeconomic History  . Marital status: Widowed    Spouse name: Not on file  . Number of children: Not on file  . Years of education: Not on file  . Highest education level: Not on file  Occupational History  . Not on file  Tobacco Use  . Smoking status: Current Every Day Smoker    Packs/day: 0.25    Years: 40.00    Pack years: 10.00    Types: Cigarettes    Start date: 10/15/2017  . Smokeless tobacco: Never Used  . Tobacco comment: smokes 2 cigarettes a day  Substance and Sexual Activity  . Alcohol use: Yes     Comment: occasional. 11-15-2016 occa  . Drug use: No    Comment: 11-15-2016 per pt no  . Sexual activity: Not Currently  Other Topics Concern  . Not on file  Social History Narrative  . Not on file   Social Determinants of Health   Financial Resource Strain:   . Difficulty of Paying Living Expenses:   Food Insecurity:   . Worried About Charity fundraiser in the Last Year:   . Arboriculturist in the Last Year:   Transportation Needs:   . Film/video editor (Medical):   Marland Kitchen Lack of Transportation (Non-Medical):   Physical Activity:   . Days of Exercise per Week:   . Minutes of Exercise per Session:   Stress:   . Feeling of Stress :   Social Connections:   . Frequency of Communication with Friends and Family:   . Frequency of Social Gatherings with Friends and Family:   . Attends Religious Services:   . Active Member of Clubs or Organizations:   . Attends Archivist Meetings:   Marland Kitchen Marital Status:     Allergies:  Allergies  Allergen Reactions  . Percocet [Oxycodone-Acetaminophen] Nausea And Vomiting    Metabolic Disorder Labs: No results found for: HGBA1C, MPG No results found for: PROLACTIN No results found for: CHOL, TRIG, HDL, CHOLHDL, VLDL, LDLCALC  Therapeutic Level Labs: No results found for: LITHIUM No results found for: VALPROATE No components found for:  CBMZ  Current Medications: Current Outpatient Medications  Medication Sig Dispense Refill  . amLODipine (NORVASC) 10 MG tablet Take 10 mg by mouth daily.     . ARIPiprazole (ABILIFY) 10 MG tablet Take 1 tablet (10 mg total) by mouth daily. 90 tablet 0  . celecoxib (CELEBREX) 100 MG capsule Take 100 mg by mouth 2 (two) times daily.    Marland Kitchen docusate sodium (COLACE) 100 MG capsule Take 1 capsule (100 mg total) by mouth 2 (two) times daily. 10 capsule 0  . escitalopram (LEXAPRO) 20 MG tablet Take 1 tablet (20 mg total) by mouth daily. 90 tablet 0  . ferrous sulfate (FERROUSUL) 325 (65 FE) MG  tablet Take 1 tablet (325 mg total) by mouth 3 (three) times daily with meals.  3  . HYDROcodone-acetaminophen (NORCO) 7.5-325 MG tablet Take 1-2 tablets by mouth every 4 (four) hours as needed for moderate pain. 60 tablet 0  . [START ON 08/02/2019] LORazepam (ATIVAN) 0.5 MG tablet Take 1 tablet (0.5 mg total) by mouth daily as needed for anxiety. 30 tablet 0  . methocarbamol (ROBAXIN) 500 MG tablet Take 1 tablet (500 mg total) by mouth every 6 (six) hours as needed for muscle spasms. 40 tablet  0  . metoprolol tartrate (LOPRESSOR) 25 MG tablet Take 25 mg by mouth 2 (two) times daily.    . polyethylene glycol (MIRALAX / GLYCOLAX) packet Take 17 g by mouth 2 (two) times daily. 14 each 0   No current facility-administered medications for this visit.     Musculoskeletal: Strength & Muscle Tone: N/A Gait & Station: N/A Patient leans: N/A  Psychiatric Specialty Exam: Review of Systems  Psychiatric/Behavioral: Positive for dysphoric mood. Negative for agitation, behavioral problems, confusion, decreased concentration, hallucinations, self-injury, sleep disturbance and suicidal ideas. The patient is nervous/anxious. The patient is not hyperactive.   All other systems reviewed and are negative.   There were no vitals taken for this visit.There is no height or weight on file to calculate BMI.  General Appearance: Fairly Groomed  Eye Contact:  Good  Speech:  Clear and Coherent  Volume:  Normal  Mood:  tired  Affect:  Appropriate, Congruent and euthymic  Thought Process:  Coherent  Orientation:  Full (Time, Place, and Person)  Thought Content: Logical   Suicidal Thoughts:  No  Homicidal Thoughts:  No  Memory:  Immediate;   Good  Judgement:  Good  Insight:  Fair  Psychomotor Activity:  Normal  Concentration:  Concentration: Good and Attention Span: Good  Recall:  Good  Fund of Knowledge: Good  Language: Good  Akathisia:  No  Handed:  Right  AIMS (if indicated): not done  Assets:   Communication Skills Desire for Improvement  ADL's:  Intact  Cognition: WNL  Sleep:  Fair   Screenings:   Assessment and Plan:  [name] is a 64 y.o. year old female with a history ofdepression, COPD, hypertension , who presents for follow up appointment for below.   1. MDD (major depressive disorder), recurrent episode, mild (HCC) 2. Anxiety She reports worsening in fatigue and anhedonia since the last visit.  Psychosocial stressors includes work, bereavement of loss of her husband in March 2018, and her son, who is in jail.  We will do further up titration of Abilify as adjunctive treatment for depression.  Discussed potential risk of weight gain and EPS. Will continue Lexapro to target depression.  Will taper down lorazepam as needed for anxiety given patient reports somnolence from 1 mg.  Discussed risk of dependence and oversedation, especially with concomitant use of opioid.   Plan I have reviewed and updated plans as below 1. Continue lexapro 20 mg daily 2.Increase Abilify 10 mg daily  3. Decrease lorazepam 0.5 mg as needed for anxiety 4. Next appointment- 7/27 at 1 PM for 20 mins, video 5. Please measure your weight weekly  - on melatonin 3 mg at night  - checked TSH by PCP in 01/2017; wnl per self report  The patient demonstrates the following risk factors for suicide: Chronic risk factors for suicide include:psychiatric disorder ofdepression, anxiety. Acute risk factorsfor suicide include: loss (financial, interpersonal, professional). Protective factorsfor this patient include: positive social support, responsibility to others (children, family), coping skills and hope for the future. Considering these factors, the overall suicide risk at this point appears to below. Patientisappropriate for outpatient follow up.  Neysa Hotter, MD 07/15/2019, 1:19 PM

## 2019-07-15 ENCOUNTER — Telehealth (INDEPENDENT_AMBULATORY_CARE_PROVIDER_SITE_OTHER): Payer: 59 | Admitting: Psychiatry

## 2019-07-15 ENCOUNTER — Encounter (HOSPITAL_COMMUNITY): Payer: Self-pay | Admitting: Psychiatry

## 2019-07-15 ENCOUNTER — Other Ambulatory Visit: Payer: Self-pay

## 2019-07-15 DIAGNOSIS — F330 Major depressive disorder, recurrent, mild: Secondary | ICD-10-CM | POA: Diagnosis not present

## 2019-07-15 DIAGNOSIS — F419 Anxiety disorder, unspecified: Secondary | ICD-10-CM

## 2019-07-15 MED ORDER — ARIPIPRAZOLE 10 MG PO TABS: 10.0000 mg | ORAL_TABLET | Freq: Every day | ORAL | 0 refills | Status: DC

## 2019-07-15 MED ORDER — LORAZEPAM 0.5 MG PO TABS: 0.5000 mg | ORAL_TABLET | Freq: Every day | ORAL | 0 refills | Status: DC | PRN

## 2019-07-15 NOTE — Patient Instructions (Signed)
1. Continue lexapro 20 mg daily 2.Increase Abilify 10 mg daily  3. Decrease lorazepam 0.5 mg as needed for anxiety 4. Next appointment- 7/27 at 1 PM  5. Please measure your weight weekly

## 2019-08-27 NOTE — Progress Notes (Signed)
Virtual Visit via Video Note  I connected with [name] on 09/02/19 at  1:00 PM EDT by a video enabled telemedicine application and verified that I am speaking with the correct person using two identifiers.   I discussed the limitations of evaluation and management by telemedicine and the availability of in person appointments. The patient expressed understanding and agreed to proceed.   I discussed the assessment and treatment plan with the patient. The patient was provided an opportunity to ask questions and all were answered. The patient agreed with the plan and demonstrated an understanding of the instructions.   The patient was advised to call back or seek an in-person evaluation if the symptoms worsen or if the condition fails to improve as anticipated.  Location: patient- car, provider- home office   I provided 11 minutes of non-face-to-face time during this encounter.   Neysa Hottereina Juline Sanderford, MD    [facility] MD/PA/NP OP Progress Note  09/02/2019 1:17 PM [name]  MRN:  [id number]  Chief Complaint:  Chief Complaint    Depression; Follow-up     HPI:  This is a follow-up appointment for depression.  She states that she feels less stressed at work.  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  She enjoyed the visit of her 856 year old grandson, who stayed with the patient last week.  She talks with her son, who is in jail every few days.  He has been doing well.  She has been concerned about financial strain.  She continues to feel fatigued.  She feels a little better since up titration of Abilify, although she denies significant difference.  She has middle insomnia.  She snores at night.  She sleeps at 9 pm and wakes up at 7 am when she goes to work, and sleeps through noon when she does not go to work. She states that she sleeps as a way of escaping.  There is a fluctuation in her appetite.  She denies any recent weight change.  She has fair  concentration and motivation.  She denies SI.  She has been able to feel relax.  She denies panic attacks.  She takes lorazepam a few times per week for anxiety. She feels comfortable with lower dose of lorazepam.    Visit Diagnosis:    ICD-10-CM   1. MDD (major depressive disorder), recurrent, in partial remission (HCC)  F33.41 ARIPiprazole (ABILIFY) 10 MG tablet    escitalopram (LEXAPRO) 20 MG tablet  2. Anxiety  F41.9 LORazepam (ATIVAN) 0.5 MG tablet    Past Psychiatric History: Please see initial evaluation for full details. I have reviewed the history. No updates at this time.     Past Medical History:  Past Medical History:  Diagnosis Date  . Anxiety   . Arthritis   . Complication of anesthesia   . Depression   . Hypertension   . PONV (postoperative nausea and vomiting)     Past Surgical History:  Procedure Laterality Date  . FOOT NEUROMA SURGERY     right foot  . KNEE SURGERY     right arthroscopic  . 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 TOTAL KNEE ARTHROPLASTY Right 04/09/2018   Procedure: TOTAL KNEE ARTHROPLASTY;  Surgeon: Durene Romanslin, Matthew, MD;  Location: [facility];  Service: Orthopedics;  Laterality: Right;  70 mins    Family Psychiatric History: Please see initial evaluation for full details. I have reviewed the history. No updates at this time.     Family History:  Family History  Problem Relation Age of Onset  . Drug abuse Son     Social History:  Social History   Socioeconomic History  . Marital status: Widowed    Spouse name: Not on file  . Number of children: Not on file  . Years of education: Not on file  . Highest education level: Not on file  Occupational History  . Not on file  Tobacco Use  . Smoking status: Current Every Day Smoker    Packs/day: 0.25    Years: 40.00    Pack years: 10.00    Types:  Cigarettes    Start date: 10/15/2017  . Smokeless tobacco: Never Used  . Tobacco comment: smokes 2 cigarettes a day  Vaping Use  . Vaping Use: Never used  Substance and Sexual Activity  . Alcohol use: Yes    Comment: occasional. 11-15-2016 occa  . Drug use: No    Comment: 11-15-2016 per pt no  . Sexual activity: Not Currently  Other Topics Concern  . Not on file  Social History Narrative  . Not on file   Social Determinants of Health   Financial Resource Strain:   . Difficulty of Paying Living Expenses:   Food Insecurity:   . Worried About Programme researcher, broadcasting/film/video in the Last Year:   . Barista in the Last Year:   Transportation Needs:   . Freight forwarder (Medical):   Marland Kitchen Lack of Transportation (Non-Medical):   Physical Activity:   . Days of Exercise per Week:   . Minutes of Exercise per Session:   Stress:   . Feeling of Stress :   Social Connections:   . Frequency of Communication with Friends and Family:   . Frequency of Social Gatherings with Friends and Family:   . Attends Religious Services:   . Active Member of Clubs or Organizations:   . Attends Banker Meetings:   Marland Kitchen Marital Status:     Allergies:  Allergies  Allergen Reactions  . Percocet [Oxycodone-Acetaminophen] Nausea And Vomiting    Metabolic Disorder Labs: No results found for: HGBA1C, MPG No results found for: PROLACTIN  Therapeutic Level Labs: No results found for: LITHIUM No results found for: VALPROATE No components found for:  CBMZ  Current Medications: Current Outpatient Medications  Medication Sig Dispense Refill  . amLODipine (NORVASC) 10 MG tablet Take 10 mg by mouth daily.     Melene Muller ON 10/15/2019] ARIPiprazole (ABILIFY) 10 MG tablet Take 1 tablet (10 mg total) by mouth daily. 90 tablet 0  . celecoxib (CELEBREX) 100 MG capsule Take 100 mg by mouth 2 (two) times daily.    Marland Kitchen docusate sodium (COLACE) 100 MG capsule Take 1 capsule (100 mg total) by mouth 2 (two) times  daily. 10 capsule 0  . escitalopram (LEXAPRO) 20 MG tablet Take 1 tablet (20 mg total) by mouth daily. 90 tablet 0  . ferrous sulfate (FERROUSUL) 325 (65 FE) MG tablet Take 1 tablet (325 mg total) by mouth 3 (three) times daily with meals.  3  . HYDROcodone-acetaminophen (NORCO) 7.5-325 MG tablet Take 1-2 tablets by mouth every 4 (four) hours as needed for moderate pain. 60 tablet 0  . LORazepam (ATIVAN) 0.5 MG tablet Take 1 tablet (0.5 mg total) by mouth daily as needed  for anxiety. 30 tablet 2  . methocarbamol (ROBAXIN) 500 MG tablet Take 1 tablet (500 mg total) by mouth every 6 (six) hours as needed for muscle spasms. 40 tablet 0  . metoprolol tartrate (LOPRESSOR) 25 MG tablet Take 25 mg by mouth 2 (two) times daily.    . polyethylene glycol (MIRALAX / GLYCOLAX) packet Take 17 g by mouth 2 (two) times daily. 14 each 0   No current facility-administered medications for this visit.     Musculoskeletal: Strength & Muscle Tone: N/A Gait & Station: N/A Patient leans: N/A  Psychiatric Specialty Exam: Review of Systems  Psychiatric/Behavioral: Positive for dysphoric mood and sleep disturbance. Negative for agitation, behavioral problems, confusion, decreased concentration, hallucinations, self-injury and suicidal ideas. The patient is nervous/anxious. The patient is not hyperactive.   All other systems reviewed and are negative.   There were no vitals taken for this visit.There is no height or weight on file to calculate BMI.  General Appearance: Fairly Groomed  Eye Contact:  Good  Speech:  Clear and Coherent  Volume:  Normal  Mood:  same  Affect:  Appropriate, Congruent and euthymic  Thought Process:  Coherent  Orientation:  Full (Time, Place, and Person)  Thought Content: Logical   Suicidal Thoughts:  No  Homicidal Thoughts:  No  Memory:  Immediate;   Good  Judgement:  Good  Insight:  Good  Psychomotor Activity:  Normal  Concentration:  Concentration: Good and Attention Span:  Good  Recall:  Good  Fund of Knowledge: Good  Language: Good  Akathisia:  No  Handed:  Right  AIMS (if indicated): not done  Assets:  Communication Skills Desire for Improvement  ADL's:  Intact  Cognition: WNL  Sleep:  hypersomnia   Screenings:   Assessment and Plan:  LYLY CANIZALES is a 64 y.o. year old female with a history of depression,COPD, hypertension, who presents for follow up appointment for below.   1. MDD (major depressive disorder), recurrent, in partial remission (HCC) 2. Anxiety She reports overall improvement in depressive symptoms and anxiety since the last visit, although she still struggles with fatigue.  Psychosocial stressors includes work, grief of loss of her husband in March 2018, and her son, who is in jail.  Will continue current medication as maintenance therapy for depression.  We will continue Lexapro to target depression.  We will continue Abilify adjunctive treatment for depression.  We will continue lorazepam as needed for anxiety.  She is aware of its risk of dependence and oversedation, especially with concomitant use of opioid.   # Insomnia She has daytime fatigue, middle insomnia, and snores at night.  Although she will greatly benefit from evaluation of sleep apnea, she is unable to afford it at this time.  We will continue to discuss as needed.   Plan I have reviewed and updated plans as below 1. Continue lexapro 20 mg daily 2.Continue Abilify 10 mg daily  3. Continue lorazepam 0.5 mg as needed for anxiety 4. Next appointment- 10/26 at 1 PM for 20 mins, video 5. Please measure your weight weekly  - on melatonin 3 mg at night  - checked TSH by PCP in 01/2017; wnl per self report  I have utilized the [place] Controlled Substances Reporting System (PMP AWARxE) to confirm adherence regarding the patient's medication. My review reveals appropriate prescription fills.   The patient demonstrates the following risk factors for suicide: Chronic  risk factors for suicide include:psychiatric disorder ofdepression, anxiety. Acute risk factorsfor suicide include:  loss (financial, interpersonal, professional). Protective factorsfor this patient include: positive social support, responsibility to others (children, family), coping skills and hope for the future. Considering these factors, the overall suicide risk at this point appears to below. Patientisappropriate for outpatient follow up.   Neysa Hotter, MD 09/02/2019, 1:17 PM

## 2019-09-02 ENCOUNTER — Telehealth (INDEPENDENT_AMBULATORY_CARE_PROVIDER_SITE_OTHER): Payer: 59 | Admitting: Psychiatry

## 2019-09-02 ENCOUNTER — Other Ambulatory Visit: Payer: Self-pay

## 2019-09-02 ENCOUNTER — Encounter (HOSPITAL_COMMUNITY): Payer: Self-pay | Admitting: Psychiatry

## 2019-09-02 DIAGNOSIS — F419 Anxiety disorder, unspecified: Secondary | ICD-10-CM

## 2019-09-02 DIAGNOSIS — F3341 Major depressive disorder, recurrent, in partial remission: Secondary | ICD-10-CM

## 2019-09-02 MED ORDER — LORAZEPAM 0.5 MG PO TABS: 0.5000 mg | ORAL_TABLET | Freq: Every day | ORAL | 2 refills | Status: DC | PRN

## 2019-09-02 MED ORDER — ESCITALOPRAM OXALATE 20 MG PO TABS: 20.0000 mg | ORAL_TABLET | Freq: Every day | ORAL | 0 refills | Status: DC

## 2019-09-02 MED ORDER — ARIPIPRAZOLE 10 MG PO TABS: 10.0000 mg | ORAL_TABLET | Freq: Every day | ORAL | 0 refills | Status: DC

## 2019-09-02 NOTE — Patient Instructions (Signed)
1. Continue lexapro 20 mg daily 2.Continue Abilify 10 mg daily  3. Continue lorazepam 0.5 mg as needed for anxiety 4. Next appointment- 10/26 at 1 PM

## 2019-11-26 NOTE — Progress Notes (Signed)
Virtual Visit via Video Note  I connected with [name] on 12/02/19 at  1:00 PM EDT by a video enabled telemedicine application and verified that I am speaking with the correct person using two identifiers.  Location: Patient: work Provider: home office   I discussed the limitations of evaluation and management by telemedicine and the availability of in person appointments. The patient expressed understanding and agreed to proceed.    I discussed the assessment and treatment plan with the patient. The patient was provided an opportunity to ask questions and all were answered. The patient agreed with the plan and demonstrated an understanding of the instructions.   The patient was advised to call back or seek an in-person evaluation if the symptoms worsen or if the condition fails to improve as anticipated.  I provided 13 minutes of non-face-to-face time during this encounter.   Neysa Hotter, MD    [facility] MD/PA/NP OP Progress Note  12/02/2019 1:19 PM [name]  MRN:  [id number]  Chief Complaint:  Chief Complaint    Depression; Follow-up     HPI:  This is a follow-up appointment for depression.  She states that she is not doing well at all.  She tends to stay in the bed, and had to take a few days off due to feeling depressed.  She has been feeling this way for months.  She cannot think of any triggers.  Although she does see her grandchild, she is making an effort to make this happen.  She contacts with her son in jail 4 times a week.  He has been doing well.  She has hypersomnia.  She feels fatigue.  She has difficulty in concentration.  She has anhedonia.  She denies any change in appetite/weight.  She denies SI.  She feels less anxious.  When she is asked about movement of her mouth (appears as if oral dyskinesia), she states that she has shortness of breath due to COPD. She thinks that her physical condition is good otherwise.   Daily routine: stays in the bed  most of the time Employment: Social research officer, government for seven years  Support: neighbor Household: by herself Marital status: widow Number of children: 1 son   159 lbs Wt Readings from Last 3 Encounters:  04/18/18 159 lb (72.1 kg)  04/09/18 159 lb 2.8 oz (72.2 kg)  04/04/18 159 lb 2 oz (72.2 kg)    Visit Diagnosis:    ICD-10-CM   1. MDD (major depressive disorder), recurrent episode, mild (HCC)  F33.0 ARIPiprazole (ABILIFY) 15 MG tablet    escitalopram (LEXAPRO) 20 MG tablet  2. Anxiety  F41.9 LORazepam (ATIVAN) 0.5 MG tablet    Past Psychiatric History: Please see initial evaluation for full details. I have reviewed the history. No updates at this time.     Past Medical History:  Past Medical History:  Diagnosis Date  . Anxiety   . Arthritis   . Complication of anesthesia   . Depression   . Hypertension   . PONV (postoperative nausea and vomiting)     Past Surgical History:  Procedure Laterality Date  . FOOT NEUROMA SURGERY     right foot  . KNEE SURGERY     right arthroscopic  . 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 TOTAL KNEE ARTHROPLASTY Right 04/09/2018   Procedure: TOTAL KNEE ARTHROPLASTY;  Surgeon: Durene Romans, MD;  Location: [facility];  Service: Orthopedics;  Laterality: Right;  70 mins    Family Psychiatric History: Please see initial evaluation for full details. I have reviewed the history. No updates at this time.     Family History:  Family History  Problem Relation Age of Onset  . Drug abuse Son     Social History:  Social History   Socioeconomic History  . Marital status: Widowed    Spouse name: Not on file  . Number of children: Not on file  . Years of education: Not on file  . Highest education level: Not on file  Occupational History  . Not on file  Tobacco Use  . Smoking status: Current Every Day  Smoker    Packs/day: 0.25    Years: 40.00    Pack years: 10.00    Types: Cigarettes    Start date: 10/15/2017  . Smokeless tobacco: Never Used  . Tobacco comment: smokes 2 cigarettes a day  Vaping Use  . Vaping Use: Never used  Substance and Sexual Activity  . Alcohol use: Yes    Comment: occasional. 11-15-2016 occa  . Drug use: No    Comment: 11-15-2016 per pt no  . Sexual activity: Not Currently  Other Topics Concern  . Not on file  Social History Narrative  . Not on file   Social Determinants of Health   Financial Resource Strain:   . Difficulty of Paying Living Expenses: Not on file  Food Insecurity:   . Worried About Programme researcher, broadcasting/film/video in the Last Year: Not on file  . Ran Out of Food in the Last Year: Not on file  Transportation Needs:   . Lack of Transportation (Medical): Not on file  . Lack of Transportation (Non-Medical): Not on file  Physical Activity:   . Days of Exercise per Week: Not on file  . Minutes of Exercise per Session: Not on file  Stress:   . Feeling of Stress : Not on file  Social Connections:   . Frequency of Communication with Friends and Family: Not on file  . Frequency of Social Gatherings with Friends and Family: Not on file  . Attends Religious Services: Not on file  . Active Member of Clubs or Organizations: Not on file  . Attends Banker Meetings: Not on file  . Marital Status: Not on file    Allergies:  Allergies  Allergen Reactions  . Percocet [Oxycodone-Acetaminophen] Nausea And Vomiting    Metabolic Disorder Labs: No results found for: HGBA1C, MPG No results found for: PROLACTIN No results found for: CHOL, TRIG, HDL, CHOLHDL, VLDL, LDLCALC  Therapeutic Level Labs: No results found for: LITHIUM No results found for: VALPROATE No components found for:  CBMZ  Current Medications: Current Outpatient Medications  Medication Sig Dispense Refill  . amLODipine (NORVASC) 10 MG tablet Take 10 mg by mouth daily.     .  ARIPiprazole (ABILIFY) 15 MG tablet Take 1 tablet (15 mg total) by mouth daily. 30 tablet 1  . celecoxib (CELEBREX) 100 MG capsule Take 100 mg by mouth 2 (two) times daily.    Marland Kitchen docusate sodium (COLACE) 100 MG capsule Take 1 capsule (100 mg total) by mouth 2 (two) times daily. 10 capsule 0  . escitalopram (LEXAPRO) 20 MG tablet Take 1 tablet (20 mg total) by mouth daily. 90 tablet 0  . ferrous sulfate (FERROUSUL) 325 (65 FE) MG tablet Take 1 tablet (325 mg total) by mouth  3 (three) times daily with meals.  3  . HYDROcodone-acetaminophen (NORCO) 7.5-325 MG tablet Take 1-2 tablets by mouth every 4 (four) hours as needed for moderate pain. 60 tablet 0  . LORazepam (ATIVAN) 0.5 MG tablet Take 1 tablet (0.5 mg total) by mouth daily as needed for anxiety. 30 tablet 1  . methocarbamol (ROBAXIN) 500 MG tablet Take 1 tablet (500 mg total) by mouth every 6 (six) hours as needed for muscle spasms. 40 tablet 0  . metoprolol tartrate (LOPRESSOR) 25 MG tablet Take 25 mg by mouth 2 (two) times daily.    . polyethylene glycol (MIRALAX / GLYCOLAX) packet Take 17 g by mouth 2 (two) times daily. 14 each 0   No current facility-administered medications for this visit.     Musculoskeletal: Strength & Muscle Tone: N/A Gait & Station: N/A Patient leans: N/A  Psychiatric Specialty Exam: Review of Systems  Psychiatric/Behavioral: Positive for decreased concentration, dysphoric mood and sleep disturbance. Negative for agitation, behavioral problems, confusion, hallucinations, self-injury and suicidal ideas. The patient is not nervous/anxious and is not hyperactive.   All other systems reviewed and are negative.   There were no vitals taken for this visit.There is no height or weight on file to calculate BMI.  General Appearance: Fairly Groomed  Eye Contact:  Good  Speech:  Clear and Coherent  Volume:  Normal  Mood:  Depressed  Affect:  Appropriate, Congruent and down  Thought Process:  Coherent  Orientation:   Full (Time, Place, and Person)  Thought Content: Logical   Suicidal Thoughts:  No  Homicidal Thoughts:  No  Memory:  Immediate;   Good  Judgement:  Good  Insight:  Fair  Psychomotor Activity:  Normal  Concentration:  Concentration: Good and Attention Span: Good  Recall:  Good  Fund of Knowledge: Good  Language: Good  Akathisia:  No  Handed:  Right  AIMS (if indicated): not done  Assets:  Communication Skills Desire for Improvement  ADL's:  Intact  Cognition: WNL  Sleep:  Poor   Screenings:   Assessment and Plan:  [name] is a 64 y.o. year old female with a history of depression,COPD, hypertension, who presents for follow up appointment for below.    1. MDD (major depressive disorder), recurrent episode, mild (HCC) 2. Anxiety She reports worsening in depressive symptoms without significant triggers since the last visit.  Psychosocial stressors includes work, grief of the loss of her husband in March 2018, and her son, who is in jail.  Will uptitrate Abilify to optimize treatment for depression.  Discussed potential metabolic side effect and EPS.  Will continue Lexapro to target depression and anxiety.  Will continue temazepam as needed for anxiety.  She is aware of its potential risk of dependence and oversedation, especially with concomitant use of opioid.   # Insomnia She has daytime fatigue, middle insomnia, and snores at night.  Although she will greatly benefit from evaluation of sleep apnea, she is unable to afford it at this time.  We will continue to discuss as needed.   Plan I have reviewed and updated plans as below 1. Continue lexapro 20 mg daily 2.Increase Abilify15mg  daily  3. Continue lorazepam 0.5 mg as needed for anxiety 4. Next appointment-12/7 at 1 PMfor 20 mins, video - on melatonin 3 mg at night - She will have annual exam in Nov. She is advised to check TSH   The patient demonstrates the following risk factors for suicide:  Chronic risk factors for  suicide include:psychiatric disorder ofdepression, anxiety. Acute risk factorsfor suicide include: loss (financial, interpersonal, professional). Protective factorsfor this patient include: positive social support, responsibility to others (children, family), coping skills and hope for the future. Considering these factors, the overall suicide risk at this point appears to below. Patientisappropriate for outpatient follow up.  Neysa Hotter, MD 12/02/2019, 1:19 PM

## 2019-12-02 ENCOUNTER — Telehealth (INDEPENDENT_AMBULATORY_CARE_PROVIDER_SITE_OTHER): Payer: 59 | Admitting: Psychiatry

## 2019-12-02 ENCOUNTER — Other Ambulatory Visit: Payer: Self-pay

## 2019-12-02 ENCOUNTER — Encounter (HOSPITAL_COMMUNITY): Payer: Self-pay | Admitting: Psychiatry

## 2019-12-02 DIAGNOSIS — F419 Anxiety disorder, unspecified: Secondary | ICD-10-CM

## 2019-12-02 DIAGNOSIS — F330 Major depressive disorder, recurrent, mild: Secondary | ICD-10-CM

## 2019-12-02 MED ORDER — ARIPIPRAZOLE 15 MG PO TABS: 15.0000 mg | ORAL_TABLET | Freq: Every day | ORAL | 1 refills | Status: DC

## 2019-12-02 MED ORDER — ESCITALOPRAM OXALATE 20 MG PO TABS: 20.0000 mg | ORAL_TABLET | Freq: Every day | ORAL | 0 refills | Status: DC

## 2019-12-02 MED ORDER — LORAZEPAM 0.5 MG PO TABS: 0.5000 mg | ORAL_TABLET | Freq: Every day | ORAL | 1 refills | Status: DC | PRN

## 2019-12-02 NOTE — Patient Instructions (Signed)
1. Continue lexapro 20 mg daily 2.Increase Abilify15mg  daily  3. Continue lorazepam 0.5 mg as needed for anxiety 4. Next appointment-12/7 at 1 PM

## 2020-01-05 NOTE — Progress Notes (Signed)
Virtual Visit via Telephone Note  I connected with [name] on 01/13/20 at  1:00 PM EST by telephone and verified that I am speaking with the correct person using two identifiers.  Location: Patient: home Provider: office Persons participated in the visit- patient, provider   I discussed the limitations, risks, security and privacy concerns of performing an evaluation and management service by telephone and the availability of in person appointments. I also discussed with the patient that there may be a patient responsible charge related to this service. The patient expressed understanding and agreed to proceed.     I discussed the assessment and treatment plan with the patient. The patient was provided an opportunity to ask questions and all were answered. The patient agreed with the plan and demonstrated an understanding of the instructions.   The patient was advised to call back or seek an in-person evaluation if the symptoms worsen or if the condition fails to improve as anticipated.  I provided 12 minutes of non-face-to-face time during this encounter.   Faith Hotter, MD    [facility] MD/PA/NP OP Progress Note  01/13/2020 1:21 PM [name]  MRN:  [id number]  Chief Complaint:  Chief Complaint    Follow-up; Depression     HPI:  This is a follow-up appointment for depression.  She states that she has been feeling better since up titration of Abilify.  She has been trying to go out when she has time.  She had a good Thanksgiving with her niece and her family.  Although she still feels stressed at work, it has been more manageable.  She has occasional pain on her foot.  She sleeps better.  She denies feeling depressed.  She has good concentration.  She had weight gain, which she attributes to unhealthy diet.  She is willing to work on this.  She denies SI.  She denies panic attacks.  She takes Ativan few times per week for anxiety.  She denies alcohol use or drug use.    Daily routine: stays in the bed most of the time Employment: Social research officer, government for seven years  Support: neighbor Household: by herself Marital status: widow Number of children: 1 son in prison, 15 more years due to robbing the drug dealer   164 lbs Wt Readings from Last 3 Encounters:  04/18/18 159 lb (72.1 kg)  04/09/18 159 lb 2.8 oz (72.2 kg)  04/04/18 159 lb 2 oz (72.2 kg)   Visit Diagnosis:    ICD-10-CM   1. MDD (major depressive disorder), recurrent, in partial remission (HCC)  F33.41 ARIPiprazole (ABILIFY) 15 MG tablet    escitalopram (LEXAPRO) 20 MG tablet  2. Anxiety  F41.9 LORazepam (ATIVAN) 0.5 MG tablet    Past Psychiatric History: Please see initial evaluation for full details. I have reviewed the history. No updates at this time.     Past Medical History:  Past Medical History:  Diagnosis Date  . Anxiety   . Arthritis   . Complication of anesthesia   . Depression   . Hypertension   . PONV (postoperative nausea and vomiting)     Past Surgical History:  Procedure Laterality Date  . FOOT NEUROMA SURGERY     right foot  . KNEE SURGERY     right arthroscopic  . 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 TOTAL KNEE ARTHROPLASTY  Right 04/09/2018   Procedure: TOTAL KNEE ARTHROPLASTY;  Surgeon: Durene Romans, MD;  Location: [facility];  Service: Orthopedics;  Laterality: Right;  70 mins    Family Psychiatric History: Please see initial evaluation for full details. I have reviewed the history. No updates at this time.     Family History:  Family History  Problem Relation Age of Onset  . Drug abuse Son     Social History:  Social History   Socioeconomic History  . Marital status: Widowed    Spouse name: Not on file  . Number of children: Not on file  . Years of education: Not on file  . Highest education level: Not on  file  Occupational History  . Not on file  Tobacco Use  . Smoking status: Current Every Day Smoker    Packs/day: 0.25    Years: 40.00    Pack years: 10.00    Types: Cigarettes    Start date: 10/15/2017  . Smokeless tobacco: Never Used  . Tobacco comment: smokes 2 cigarettes a day  Vaping Use  . Vaping Use: Never used  Substance and Sexual Activity  . Alcohol use: Yes    Comment: occasional. 11-15-2016 occa  . Drug use: No    Comment: 11-15-2016 per pt no  . Sexual activity: Not Currently  Other Topics Concern  . Not on file  Social History Narrative  . Not on file   Social Determinants of Health   Financial Resource Strain:   . Difficulty of Paying Living Expenses: Not on file  Food Insecurity:   . Worried About Programme researcher, broadcasting/film/video in the Last Year: Not on file  . Ran Out of Food in the Last Year: Not on file  Transportation Needs:   . Lack of Transportation (Medical): Not on file  . Lack of Transportation (Non-Medical): Not on file  Physical Activity:   . Days of Exercise per Week: Not on file  . Minutes of Exercise per Session: Not on file  Stress:   . Feeling of Stress : Not on file  Social Connections:   . Frequency of Communication with Friends and Family: Not on file  . Frequency of Social Gatherings with Friends and Family: Not on file  . Attends Religious Services: Not on file  . Active Member of Clubs or Organizations: Not on file  . Attends Banker Meetings: Not on file  . Marital Status: Not on file    Allergies:  Allergies  Allergen Reactions  . Percocet [Oxycodone-Acetaminophen] Nausea And Vomiting    Metabolic Disorder Labs: No results found for: HGBA1C, MPG No results found for: PROLACTIN No results found for: CHOL, TRIG, HDL, CHOLHDL, VLDL, LDLCALC   Therapeutic Level Labs: No results found for: LITHIUM No results found for: VALPROATE No components found for:  CBMZ  Current Medications: Current Outpatient Medications   Medication Sig Dispense Refill  . amLODipine (NORVASC) 10 MG tablet Take 10 mg by mouth daily.     Melene Muller ON 01/30/2020] ARIPiprazole (ABILIFY) 15 MG tablet Take 1 tablet (15 mg total) by mouth daily. 30 tablet 1  . celecoxib (CELEBREX) 100 MG capsule Take 100 mg by mouth 2 (two) times daily.    Marland Kitchen docusate sodium (COLACE) 100 MG capsule Take 1 capsule (100 mg total) by mouth 2 (two) times daily. 10 capsule 0  . [START ON 03/01/2020] escitalopram (LEXAPRO) 20 MG tablet Take 1 tablet (20 mg total) by mouth daily. 90 tablet 0  .  ferrous sulfate (FERROUSUL) 325 (65 FE) MG tablet Take 1 tablet (325 mg total) by mouth 3 (three) times daily with meals.  3  . HYDROcodone-acetaminophen (NORCO) 7.5-325 MG tablet Take 1-2 tablets by mouth every 4 (four) hours as needed for moderate pain. 60 tablet 0  . [START ON 02/12/2020] LORazepam (ATIVAN) 0.5 MG tablet Take 1 tablet (0.5 mg total) by mouth daily as needed for anxiety. 30 tablet 0  . methocarbamol (ROBAXIN) 500 MG tablet Take 1 tablet (500 mg total) by mouth every 6 (six) hours as needed for muscle spasms. 40 tablet 0  . metoprolol tartrate (LOPRESSOR) 25 MG tablet Take 25 mg by mouth 2 (two) times daily.    . polyethylene glycol (MIRALAX / GLYCOLAX) packet Take 17 g by mouth 2 (two) times daily. 14 each 0   No current facility-administered medications for this visit.     Musculoskeletal: Strength & Muscle Tone: N/A Gait & Station: N/A Patient leans: N/A  Psychiatric Specialty Exam: Review of Systems  Psychiatric/Behavioral: Negative for agitation, behavioral problems, confusion, decreased concentration, dysphoric mood, hallucinations, self-injury, sleep disturbance and suicidal ideas. The patient is nervous/anxious. The patient is not hyperactive.   All other systems reviewed and are negative.   There were no vitals taken for this visit.There is no height or weight on file to calculate BMI.  General Appearance: NA  Eye Contact:  NA  Speech:   Clear and Coherent  Volume:  Normal  Mood:  better  Affect:  NA  Thought Process:  Coherent  Orientation:  Full (Time, Place, and Person)  Thought Content: Logical   Suicidal Thoughts:  No  Homicidal Thoughts:  No  Memory:  Immediate;   Good  Judgement:  Good  Insight:  Good  Psychomotor Activity:  Normal  Concentration:  Concentration: Good and Attention Span: Good  Recall:  Good  Fund of Knowledge: Good  Language: Fair  Akathisia:  No  Handed:  Right  AIMS (if indicated): not done  Assets:  Communication Skills Desire for Improvement  ADL's:  Intact  Cognition: WNL  Sleep:  Good   Screenings:   Assessment and Plan:  DAVEN PINCKNEY is a 64 y.o. year old female with a history of depression,COPD, hypertension, who presents for follow up appointment for below.   1. MDD (major depressive disorder), recurrent, in partial remission (HCC) 3. Anxiety There has been significant improvement in depressive symptoms and anxiety since up titration of Abilify. Psychosocial stressors includes work, grief of the loss of her husband in March 2018, and her son, who is in jail.    Will continue current medication regimen.  Will continue Lexapro to target depression and anxiety.  Will continue Abilify as adjunctive treatment for depression.  Discussed potential metabolic side effect and EPS.  Will continue lorazepam as needed for anxiety.  Discussed risk of dependence and oversedation especially with concomitant use of opioid.   # Insomnia She has daytime fatigue,middle insomnia,and snores at night.Although she will greatly benefit from evaluation of sleep apnea,she is unable to afford it at this time.We will continue to discuss as needed.   Plan .I have reviewed and updated plans as below 1. Continue lexapro 20 mg daily 2.Continue Abilify15mg  daily - monitor weight gain 3.Continuelorazepam 0.5 mg as needed for anxiety 4. Next appointment- 2/1  at 1 PMfor 20 mins,  video - on melatonin 3 mg at night - She will have annual exam in Jan. She is advised to check TSH  I have  utilized the [place] Controlled Substances Reporting System (PMP [place]) to confirm adherence regarding the patient's medication. My review reveals appropriate prescription fills.   The patient demonstrates the following risk factors for suicide: Chronic risk factors for suicide include:psychiatric disorder ofdepression, anxiety. Acute risk factorsfor suicide include: loss (financial, interpersonal, professional). Protective factorsfor this patient include: positive social support, responsibility to others (children, family), coping skills and hope for the future. Considering these factors, the overall suicide risk at this point appears to below. Patientisappropriate for outpatient follow up.  Faith Hottereina Anupama Piehl, MD 01/13/2020, 1:21 PM

## 2020-01-13 ENCOUNTER — Encounter: Payer: Self-pay | Admitting: Psychiatry

## 2020-01-13 ENCOUNTER — Other Ambulatory Visit: Payer: Self-pay

## 2020-01-13 ENCOUNTER — Telehealth (INDEPENDENT_AMBULATORY_CARE_PROVIDER_SITE_OTHER): Payer: 59 | Admitting: Psychiatry

## 2020-01-13 DIAGNOSIS — F3341 Major depressive disorder, recurrent, in partial remission: Secondary | ICD-10-CM

## 2020-01-13 DIAGNOSIS — F419 Anxiety disorder, unspecified: Secondary | ICD-10-CM

## 2020-01-13 MED ORDER — LORAZEPAM 0.5 MG PO TABS: 0.5000 mg | ORAL_TABLET | Freq: Every day | ORAL | 0 refills | Status: DC | PRN

## 2020-01-13 MED ORDER — ARIPIPRAZOLE 15 MG PO TABS: 15.0000 mg | ORAL_TABLET | Freq: Every day | ORAL | 1 refills | Status: DC

## 2020-01-13 MED ORDER — ESCITALOPRAM OXALATE 20 MG PO TABS: 20.0000 mg | ORAL_TABLET | Freq: Every day | ORAL | 0 refills | Status: DC

## 2020-01-13 NOTE — Patient Instructions (Signed)
1. Continue lexapro 20 mg daily 2.Continue Abilify15mg  daily  3.Continuelorazepam 0.5 mg as needed for anxiety 4. Next appointment- 2/1  at 1 PM

## 2020-03-08 NOTE — Progress Notes (Unsigned)
Virtual Visit via Video Note  I connected with [name] on 03/09/20 at  1:00 PM EST by a video enabled telemedicine application and verified that I am speaking with the correct person using two identifiers.  Location: Patient: home Provider: office Persons participated in the visit- patient, provider   I discussed the limitations of evaluation and management by telemedicine and the availability of in person appointments. The patient expressed understanding and agreed to proceed.     I discussed the assessment and treatment plan with the patient. The patient was provided an opportunity to ask questions and all were answered. The patient agreed with the plan and demonstrated an understanding of the instructions.   The patient was advised to call back or seek an in-person evaluation if the symptoms worsen or if the condition fails to improve as anticipated.  I provided 15 minutes of non-face-to-face time during this encounter.   Neysa Hotter, MD    [facility] MD/PA/NP OP Progress Note  03/09/2020 1:25 PM [name]  MRN:  [id number]  Chief Complaint:  Chief Complaint    Depression; Follow-up     HPI:  This is a follow-up appointment for depression.  She states that holiday was hurt as she missed her husband.  She enjoyed the time with her niece and her daughter-in-law.  She tends to think about her husband almost every day.  She tends to take a nap when she misses him.  She talks with her son in jail, a few times per week.  She states that her mood is "not great "and depressed without any reason.  She feels fatigue during the day.  She does not shift occasionally, and has had issues with insomnia.  She denies any change in appetite or weight.  She has fair concentration.  She denies SI.  She does not do any exercise except she is very active during the work. She feels anxious and tense at times.  She denies alcohol use or drug use.  She denies any tremors except when she  feels anxious.    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 MDD (major depressive disorder), recurrent episode, mild (HCC)  F33.0 ARIPiprazole (ABILIFY) 15 MG tablet  2. Anxiety  F41.9   3. Insomnia, unspecified type  G47.00     Past Psychiatric History: Please see initial evaluation for full details. I have reviewed the history. No updates at this time.     Past Medical History:  Past Medical History:  Diagnosis Date  . Anxiety   . Arthritis   . Complication of anesthesia   . Depression   . Hypertension   . PONV (postoperative nausea and vomiting)     Past Surgical History:  Procedure Laterality Date  . FOOT NEUROMA SURGERY     right foot  . KNEE SURGERY     right arthroscopic  . 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 TOTAL KNEE ARTHROPLASTY Right 04/09/2018   Procedure: TOTAL KNEE ARTHROPLASTY;  Surgeon: Durene Romans, MD;  Location: [facility];  Service: Orthopedics;  Laterality: Right;  70 mins    Family Psychiatric History: Please see initial  evaluation for full details. I have reviewed the history. No updates at this time.     Family History:  Family History  Problem Relation Age of Onset  . Drug abuse Son     Social History:  Social History   Socioeconomic History  . Marital status: Widowed    Spouse name: Not on file  . Number of children: Not on file  . Years of education: Not on file  . Highest education level: Not on file  Occupational History  . Not on file  Tobacco Use  . Smoking status: Current Every Day Smoker    Packs/day: 0.25    Years: 40.00    Pack years: 10.00     Types: Cigarettes    Start date: 10/15/2017  . Smokeless tobacco: Never Used  . Tobacco comment: smokes 2 cigarettes a day  Vaping Use  . Vaping Use: Never used  Substance and Sexual Activity  . Alcohol use: Yes    Comment: occasional. 11-15-2016 occa  . Drug use: No    Comment: 11-15-2016 per pt no  . Sexual activity: Not Currently  Other Topics Concern  . Not on file  Social History Narrative  . Not on file   Social Determinants of Health   Financial Resource Strain: Not on file  Food Insecurity: Not on file  Transportation Needs: Not on file  Physical Activity: Not on file  Stress: Not on file  Social Connections: Not on file    Allergies:  Allergies  Allergen Reactions  . Percocet [Oxycodone-Acetaminophen] Nausea And Vomiting    Metabolic Disorder Labs: No results found for: HGBA1C, MPG No results found for: PROLACTIN No results found for: CHOL, TRIG, HDL, CHOLHDL, VLDL, LDLCALC  Therapeutic Level Labs: No results found for: LITHIUM No results found for: VALPROATE No components found for:  CBMZ  Current Medications: Current Outpatient Medications  Medication Sig Dispense Refill  . traZODone (DESYREL) 50 MG tablet 25-50 mg at night as needed for sleep 90 tablet 0  . amLODipine (NORVASC) 10 MG tablet Take 10 mg by mouth daily.     . ARIPiprazole (ABILIFY) 15 MG tablet Take 1 tablet (15 mg total) by mouth daily. 90 tablet 0  . celecoxib (CELEBREX) 100 MG capsule Take 100 mg by mouth 2 (two) times daily.    Marland Kitchen docusate sodium (COLACE) 100 MG capsule Take 1 capsule (100 mg total) by mouth 2 (two) times daily. 10 capsule 0  . escitalopram (LEXAPRO) 20 MG tablet Take 1 tablet (20 mg total) by mouth daily. 90 tablet 0  . ferrous sulfate (FERROUSUL) 325 (65 FE) MG tablet Take 1 tablet (325 mg total) by mouth 3 (three) times daily with meals.  3  . HYDROcodone-acetaminophen (NORCO) 7.5-325 MG tablet Take 1-2 tablets by mouth every 4 (four) hours as needed for moderate  pain. 60 tablet 0  . LORazepam (ATIVAN) 0.5 MG tablet Take 1 tablet (0.5 mg total) by mouth daily as needed for anxiety. 30 tablet 0  . methocarbamol (ROBAXIN) 500 MG tablet Take 1 tablet (500 mg total) by mouth every 6 (six) hours as needed for muscle spasms. 40 tablet 0  . metoprolol tartrate (LOPRESSOR) 25 MG tablet Take 25 mg by mouth 2 (two) times daily.    . polyethylene glycol (MIRALAX / GLYCOLAX) packet Take 17 g by mouth 2 (two) times daily. 14 each 0   No current facility-administered medications for this visit.     Musculoskeletal: Strength & Muscle Tone: N/A Gait &  Station: N/A Patient leans: N/A  Psychiatric Specialty Exam: Review of Systems  Psychiatric/Behavioral: Positive for dysphoric mood and sleep disturbance. Negative for agitation, behavioral problems, confusion, decreased concentration, hallucinations, self-injury and suicidal ideas. The patient is nervous/anxious. The patient is not hyperactive.   All other systems reviewed and are negative.   There were no vitals taken for this visit.There is no height or weight on file to calculate BMI.  General Appearance: Fairly Groomed  Eye Contact:  Good  Speech:  Clear and Coherent  Volume:  Normal  Mood:  Depressed  Affect:  Appropriate, Congruent and euthymic  Thought Process:  Coherent  Orientation:  Full (Time, Place, and Person)  Thought Content: Logical   Suicidal Thoughts:  No  Homicidal Thoughts:  No  Memory:  Immediate;   Good  Judgement:  Good  Insight:  Good  Psychomotor Activity:  Normal  Concentration:  Concentration: Good and Attention Span: Good  Recall:  Good  Fund of Knowledge: Good  Language: Good  Akathisia:  No  Handed:  Right  AIMS (if indicated): not done  Assets:  Communication Skills Desire for Improvement  ADL's:  Intact  Cognition: WNL  Sleep:  Poor   Screenings:   Assessment and Plan:  THANYA CEGIELSKI is a 65 y.o. year old female with a history of  depression,COPD,  hypertension, who presents for follow up appointment for below.   1. Anxiety 2. MDD (major depressive disorder), recurrent episode, mild (HCC) She reports slight worsening in depressive symptoms in the context of insomnia, and having gone through holiday without her husband.  Other psychosocial stressors includes work, grief of the loss of her husband in March 2018,and her son,who is in jail.Will continue current medication regimen with the hope that her mood improves as her sleep hygiene improves.  Will continue Lexapro to target depression and anxiety.  Will continue Abilify as adjunctive treatment for depression.  Discussed potential metabolic side effect and EPS.  Will continue lorazepam as needed for anxiety.  She is aware of its risk of dependence and oversedation especially with concomitant use of opioid.   3. Insomnia, unspecified type She has daytime fatigue, middle insomnia, and snoring.  Although she will greatly benefit from evaluation of sleep apnea, she would like to hold this at this time.  We will start trazodone as needed for insomnia.  She is also advised to try melatonin for insomnia related to shift change.   Plan I have reviewed and updated plans as below 1. Continue lexapro 20 mg daily 2.Continue Abilify15mg  daily - monitor weight gain 3. Start Trazodone 25 -50 mg at night as needed for sleep 4.Continuelorazepam 0.5 mg as needed for anxiety- one refill left 5. Next appointment-3/15  at 1 PMfor 20 mins, video - on melatonin 3 mg at night - She will have annual exam in Jan. She is advised to check TSH   The patient demonstrates the following risk factors for suicide: Chronic risk factors for suicide include:psychiatric disorder ofdepression, anxiety. Acute risk factorsfor suicide include: loss (financial, interpersonal, professional). Protective factorsfor this patient include: positive social support, responsibility to others (children, family), coping  skills and hope for the future. Considering these factors, the overall suicide risk at this point appears to below. Patientisappropriate for outpatient follow up.   Neysa Hotter, MD 03/09/2020, 1:25 PM

## 2020-03-09 ENCOUNTER — Encounter: Payer: Self-pay | Admitting: Psychiatry

## 2020-03-09 ENCOUNTER — Other Ambulatory Visit: Payer: Self-pay

## 2020-03-09 ENCOUNTER — Telehealth (INDEPENDENT_AMBULATORY_CARE_PROVIDER_SITE_OTHER): Payer: 59 | Admitting: Psychiatry

## 2020-03-09 DIAGNOSIS — F330 Major depressive disorder, recurrent, mild: Secondary | ICD-10-CM

## 2020-03-09 DIAGNOSIS — F419 Anxiety disorder, unspecified: Secondary | ICD-10-CM | POA: Diagnosis not present

## 2020-03-09 DIAGNOSIS — G4700 Insomnia, unspecified: Secondary | ICD-10-CM

## 2020-03-09 MED ORDER — ARIPIPRAZOLE 15 MG PO TABS: 15.0000 mg | ORAL_TABLET | Freq: Every day | ORAL | 0 refills | Status: DC

## 2020-03-09 MED ORDER — TRAZODONE HCL 50 MG PO TABS: ORAL_TABLET | ORAL | 0 refills | Status: DC

## 2020-03-09 NOTE — Patient Instructions (Signed)
1. Continue lexapro 20 mg daily 2.Continue Abilify15mg  daily - monitor weight gain 3. Start Trazodone 25 -50 mg at night as needed for sleep 4.Continuelorazepam 0.5 mg as needed for anxiety 5. Next appointment-3/15  at 1 PM

## 2020-04-07 NOTE — Progress Notes (Signed)
Virtual Visit via Video Note  I connected with [name] on 04/20/20 at  1:00 PM EDT by a video enabled telemedicine application and verified that I am speaking with the correct person using two identifiers.  Location: Patient: home Provider: office Persons participated in the visit- patient, provider   I discussed the limitations of evaluation and management by telemedicine and the availability of in person appointments. The patient expressed understanding and agreed to proceed.    I discussed the assessment and treatment plan with the patient. The patient was provided an opportunity to ask questions and all were answered. The patient agreed with the plan and demonstrated an understanding of the instructions.   The patient was advised to call back or seek an in-person evaluation if the symptoms worsen or if the condition fails to improve as anticipated.  I provided 15 minutes of non-face-to-face time during this encounter.   Faith Hotter, MD    [facility] MD/PA/NP OP Progress Note  04/20/2020 2:43 PM [name]  MRN:  [id number]  Chief Complaint:  Chief Complaint    Follow-up; Depression     HPI:  This is a follow-up appointment for depression.   She states that she is not doing good.  She heard from her son that he was doing drug in prison.  She feels sad about the situation.  She told him that she cannot do anything while she would support him.  Although she goes to work every day, she does not feel like being there.  She has depressive symptoms as in PHQ-9.  She denies SI.  She has a head tremors at least for the past few months.  She thinks it has gotten worse since up titration of Abilify.  She agrees to try lowering the dose, and starting mirtazapine.  She takes lorazepam every day or less for anxiety.  She denies drug use or alcohol use.    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 Anxiety  F41.9 LORazepam (ATIVAN) 0.5 MG tablet  2. MDD (major depressive disorder), recurrent episode, mild (HCC)  F33.0     Past Psychiatric History: Please see initial evaluation for full details. I have reviewed the history. No updates at this time.     Past Medical History:  Past Medical History:  Diagnosis Date  . Anxiety   . Arthritis   . Complication of anesthesia   . Depression   . Hypertension   . PONV (postoperative nausea and vomiting)     Past Surgical History:  Procedure Laterality Date  . FOOT NEUROMA SURGERY     right foot  . KNEE SURGERY     right arthroscopic  . 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 TOTAL KNEE ARTHROPLASTY Right 04/09/2018   Procedure: TOTAL KNEE ARTHROPLASTY;  Surgeon: Durene Romans, MD;  Location: [facility];  Service: Orthopedics;  Laterality: Right;  70 mins    Family Psychiatric History: Please see initial evaluation for full details. I have reviewed the history. No updates at this time.     Family History:  Family History  Problem Relation Age of Onset  . Drug abuse Son  Social History:  Social History   Socioeconomic History  . Marital status: Widowed    Spouse name: Not on file  . Number of children: Not on file  . Years of education: Not on file  . Highest education level: Not on file  Occupational History  . Not on file  Tobacco Use  . Smoking status: Current Every Day Smoker    Packs/day: 0.25    Years: 40.00    Pack years: 10.00    Types: Cigarettes    Start date: 10/15/2017  . Smokeless tobacco: Never Used  . Tobacco comment: smokes 2 cigarettes a day  Vaping Use  . Vaping Use:  Never used  Substance and Sexual Activity  . Alcohol use: Yes    Comment: occasional. 11-15-2016 occa  . Drug use: No    Comment: 11-15-2016 per pt no  . Sexual activity: Not Currently  Other Topics Concern  . Not on file  Social History Narrative  . Not on file   Social Determinants of Health   Financial Resource Strain: Not on file  Food Insecurity: Not on file  Transportation Needs: Not on file  Physical Activity: Not on file  Stress: Not on file  Social Connections: Not on file    Allergies:  Allergies  Allergen Reactions  . Percocet [Oxycodone-Acetaminophen] Nausea And Vomiting    Metabolic Disorder Labs: No results found for: HGBA1C, MPG No results found for: PROLACTIN No results found for: CHOL, TRIG, HDL, CHOLHDL, VLDL, LDLCALC  Therapeutic Level Labs: No results found for: LITHIUM No results found for: VALPROATE No components found for:  CBMZ  Current Medications: Current Outpatient Medications  Medication Sig Dispense Refill  . ARIPiprazole (ABILIFY) 10 MG tablet Take 1 tablet (10 mg total) by mouth daily. 30 tablet 1  . mirtazapine (REMERON) 15 MG tablet 7.5 mg at night for one week, then 15 mg at night 30 tablet 1  . amLODipine (NORVASC) 10 MG tablet Take 10 mg by mouth daily.     . ARIPiprazole (ABILIFY) 15 MG tablet Take 1 tablet (15 mg total) by mouth daily. 90 tablet 0  . celecoxib (CELEBREX) 100 MG capsule Take 100 mg by mouth 2 (two) times daily.    Marland Kitchen docusate sodium (COLACE) 100 MG capsule Take 1 capsule (100 mg total) by mouth 2 (two) times daily. 10 capsule 0  . escitalopram (LEXAPRO) 20 MG tablet Take 1 tablet (20 mg total) by mouth daily. 90 tablet 0  . ferrous sulfate (FERROUSUL) 325 (65 FE) MG tablet Take 1 tablet (325 mg total) by mouth 3 (three) times daily with meals.  3  . HYDROcodone-acetaminophen (NORCO) 7.5-325 MG tablet Take 1-2 tablets by mouth every 4 (four) hours as needed for moderate pain. 60 tablet 0  . LORazepam (ATIVAN) 0.5 MG  tablet Take 1 tablet (0.5 mg total) by mouth daily as needed for anxiety. 30 tablet 0  . methocarbamol (ROBAXIN) 500 MG tablet Take 1 tablet (500 mg total) by mouth every 6 (six) hours as needed for muscle spasms. 40 tablet 0  . metoprolol tartrate (LOPRESSOR) 25 MG tablet Take 25 mg by mouth 2 (two) times daily.    . polyethylene glycol (MIRALAX / GLYCOLAX) packet Take 17 g by mouth 2 (two) times daily. 14 each 0   No current facility-administered medications for this visit.     Musculoskeletal: Strength & Muscle Tone: N/A Gait & Station: N/A Patient leans: N/A  Psychiatric Specialty Exam: Review of Systems  Psychiatric/Behavioral: Positive  for dysphoric mood and sleep disturbance. Negative for agitation, behavioral problems, confusion, decreased concentration, hallucinations, self-injury and suicidal ideas. The patient is nervous/anxious. The patient is not hyperactive.   All other systems reviewed and are negative.   There were no vitals taken for this visit.There is no height or weight on file to calculate BMI.  General Appearance: Fairly Groomed  Eye Contact:  Good  Speech:  Clear and Coherent  Volume:  Normal  Mood:  Depressed  Affect:  Appropriate, Congruent and down  Thought Process:  Coherent  Orientation:  Full (Time, Place, and Person)  Thought Content: Logical   Suicidal Thoughts:  No  Homicidal Thoughts:  No  Memory:  Immediate;   Good  Judgement:  Good  Insight:  Good  Psychomotor Activity:  Normal  Concentration:  Concentration: Good and Attention Span: Good  Recall:  Good  Fund of Knowledge: Good  Language: Good  Akathisia:  No  Handed:  Right  AIMS (if indicated): not done  Assets:  Communication Skills Desire for Improvement  ADL's:  Intact  Cognition: WNL  Sleep:  Poor   Screenings: PHQ2-9   Flowsheet Row Video Visit from 04/20/2020 in [facility]  PHQ-2 Total Score 6  PHQ-9 Total Score 11    Flowsheet Row Video  Visit from 04/20/2020 in [facility]  C-SSRS RISK CATEGORY No Risk       Assessment and Plan:  SKARLETTE LATTNER is a 65 y.o. year old female with a history of depression,COPD, hypertension, who presents for follow up appointment for below.   1. Anxiety 2. MDD (major depressive disorder), recurrent episode, mild (HCC) She reports slight worsening in depressive symptoms in the context of her son relapsed in drug use in prison.  Other psychosocial stressors includes work, and grief of loss of her husband in March 2018.  Will add mirtazapine to target depression.  Discussed potential metabolic side effect and drowsiness.  Will taper down Abilify given head tremors she has.  Discussed potential metabolic side effect and EPS.  Will continue temazepam as needed for anxiety.  She is aware of its potential risk of dependence and oversedation especially with concomitant use of opioid.  Will discontinue trazodone due to adverse reaction of drowsiness.    Plan I have reviewed and updated plans as below 1. Continue lexapro 20 mg daily 2.Decrease Abilify10 mg daily- monitor weight gain, EPS 3. Discontinue Trazodone  4. Start mirtazapine 7.5 mg at night for one week, then 15 mg at night  5. Continuelorazepam 0.5 mg as needed for anxiety  5. Next appointment-4/18 at 3 PM for 30 mins, in person  - on melatonin 3 mg at night - She will have annual exam in April. She is advised to check TSH  Past trials of medication:sertraline, fluoxetine (became emotional), bupropion,  Abilify, trazodone (drowsiness), Lunesta  The patient demonstrates the following risk factors for suicide: Chronic risk factors for suicide include:psychiatric disorder ofdepression, anxiety. Acute risk factorsfor suicide include: loss (financial, interpersonal, professional). Protective factorsfor this patient include: positive social support, responsibility to others (children, family),  coping skills and hope for the future. Considering these factors, the overall suicide risk at this point appears to below. Patientisappropriate for outpatient follow up.   Faith Hotter, MD 04/20/2020, 2:43 PM

## 2020-04-20 ENCOUNTER — Telehealth (INDEPENDENT_AMBULATORY_CARE_PROVIDER_SITE_OTHER): Payer: 59 | Admitting: Psychiatry

## 2020-04-20 ENCOUNTER — Encounter: Payer: Self-pay | Admitting: Psychiatry

## 2020-04-20 ENCOUNTER — Other Ambulatory Visit: Payer: Self-pay

## 2020-04-20 DIAGNOSIS — F419 Anxiety disorder, unspecified: Secondary | ICD-10-CM | POA: Diagnosis not present

## 2020-04-20 DIAGNOSIS — F330 Major depressive disorder, recurrent, mild: Secondary | ICD-10-CM | POA: Diagnosis not present

## 2020-04-20 MED ORDER — MIRTAZAPINE 15 MG PO TABS: ORAL_TABLET | ORAL | 1 refills | Status: DC

## 2020-04-20 MED ORDER — LORAZEPAM 0.5 MG PO TABS: 0.5000 mg | ORAL_TABLET | Freq: Every day | ORAL | 0 refills | Status: DC | PRN

## 2020-04-20 MED ORDER — ARIPIPRAZOLE 10 MG PO TABS: 10.0000 mg | ORAL_TABLET | Freq: Every day | ORAL | 1 refills | Status: DC

## 2020-04-20 NOTE — Patient Instructions (Addendum)
1. Continue lexapro 20 mg daily 2.Decrease Abilify10 mg daily 3. Discontinue Trazodone  4. Start mirtazapine 7.5 mg at night for one week, then 15 mg at night  5. Continuelorazepam 0.5 mg as needed for anxiety  5. Next appointment-4/18 at 3 PM, in person visit

## 2020-05-19 NOTE — Progress Notes (Deleted)
BH MD/PA/NP OP Progress Note  05/19/2020 11:10 AM NUSRAT ENCARNACION  MRN:  [id number]  Chief Complaint:  HPI: *** Visit Diagnosis: No diagnosis found.  Past Psychiatric History: Please see initial evaluation for full details. I have reviewed the history. No updates at this time.     Past Medical History:  Past Medical History:  Diagnosis Date  . Anxiety   . Arthritis   . Complication of anesthesia   . Depression   . Hypertension   . PONV (postoperative nausea and vomiting)     Past Surgical History:  Procedure Laterality Date  . FOOT NEUROMA SURGERY     right foot  . KNEE SURGERY     right arthroscopic  . 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 TOTAL KNEE ARTHROPLASTY Right 04/09/2018   Procedure: TOTAL KNEE ARTHROPLASTY;  Surgeon: Durene Romans, MD;  Location: [facility];  Service: Orthopedics;  Laterality: Right;  70 mins    Family Psychiatric History: Please see initial evaluation for full details. I have reviewed the history. No updates at this time.     Family History:  Family History  Problem Relation Age of Onset  . Drug abuse Son     Social History:  Social History   Socioeconomic History  . Marital status: Widowed    Spouse name: Not on file  . Number of children: Not on file  . Years of education: Not on file  . Highest education level: Not on file  Occupational History  . Not on file  Tobacco Use  . Smoking status: Current Every Day Smoker    Packs/day: 0.25    Years: 40.00    Pack years: 10.00    Types: Cigarettes    Start date: 10/15/2017  . Smokeless tobacco: Never Used  . Tobacco comment: smokes 2 cigarettes a day  Vaping Use  . Vaping Use: Never used  Substance and Sexual Activity  . Alcohol use: Yes    Comment: occasional. 11-15-2016 occa  . Drug use: No    Comment: 11-15-2016 per pt no  . Sexual  activity: Not Currently  Other Topics Concern  . Not on file  Social History Narrative  . Not on file   Social Determinants of Health   Financial Resource Strain: Not on file  Food Insecurity: Not on file  Transportation Needs: Not on file  Physical Activity: Not on file  Stress: Not on file  Social Connections: Not on file    Allergies:  Allergies  Allergen Reactions  . Percocet [Oxycodone-Acetaminophen] Nausea And Vomiting    Metabolic Disorder Labs: No results found for: HGBA1C, MPG No results found for: PROLACTIN No results found for: CHOL, TRIG, HDL, CHOLHDL, VLDL, LDLCALC Lab Results  Component Value Date   TSH 1.889 ***Test methodology is 3rd generation TSH*** 07/18/2007    Therapeutic Level Labs: No results found for: LITHIUM No results found for: VALPROATE No components found for:  CBMZ  Current Medications: Current Outpatient Medications  Medication Sig Dispense Refill  . amLODipine (NORVASC) 10 MG tablet Take 10 mg by mouth daily.     . ARIPiprazole (ABILIFY) 10 MG tablet Take 1 tablet (10 mg total) by mouth daily. 30 tablet 1  . ARIPiprazole (ABILIFY) 15 MG tablet Take 1 tablet (15 mg total) by mouth daily. 90 tablet 0  . celecoxib (CELEBREX) 100 MG capsule  Take 100 mg by mouth 2 (two) times daily.    Marland Kitchen docusate sodium (COLACE) 100 MG capsule Take 1 capsule (100 mg total) by mouth 2 (two) times daily. 10 capsule 0  . escitalopram (LEXAPRO) 20 MG tablet Take 1 tablet (20 mg total) by mouth daily. 90 tablet 0  . ferrous sulfate (FERROUSUL) 325 (65 FE) MG tablet Take 1 tablet (325 mg total) by mouth 3 (three) times daily with meals.  3  . HYDROcodone-acetaminophen (NORCO) 7.5-325 MG tablet Take 1-2 tablets by mouth every 4 (four) hours as needed for moderate pain. 60 tablet 0  . LORazepam (ATIVAN) 0.5 MG tablet Take 1 tablet (0.5 mg total) by mouth daily as needed for anxiety. 30 tablet 0  . methocarbamol (ROBAXIN) 500 MG tablet Take 1 tablet (500 mg total) by  mouth every 6 (six) hours as needed for muscle spasms. 40 tablet 0  . metoprolol tartrate (LOPRESSOR) 25 MG tablet Take 25 mg by mouth 2 (two) times daily.    . mirtazapine (REMERON) 15 MG tablet 7.5 mg at night for one week, then 15 mg at night 30 tablet 1  . polyethylene glycol (MIRALAX / GLYCOLAX) packet Take 17 g by mouth 2 (two) times daily. 14 each 0   No current facility-administered medications for this visit.     Musculoskeletal: Strength & Muscle Tone: N/A Gait & Station: N/A Patient leans: N/A  Psychiatric Specialty Exam: Review of Systems  There were no vitals taken for this visit.There is no height or weight on file to calculate BMI.  General Appearance: {Appearance:22683}  Eye Contact:  {BHH EYE CONTACT:22684}  Speech:  Clear and Coherent  Volume:  Normal  Mood:  {BHH MOOD:22306}  Affect:  {Affect (PAA):22687}  Thought Process:  Coherent  Orientation:  Full (Time, Place, and Person)  Thought Content: Logical   Suicidal Thoughts:  {ST/HT (PAA):22692}  Homicidal Thoughts:  {ST/HT (PAA):22692}  Memory:  Immediate;   Good  Judgement:  {Judgement (PAA):22694}  Insight:  {Insight (PAA):22695}  Psychomotor Activity:  Normal  Concentration:  Concentration: Good and Attention Span: Good  Recall:  Good  Fund of Knowledge: Good  Language: Good  Akathisia:  No  Handed:  Right  AIMS (if indicated): not done  Assets:  Communication Skills Desire for Improvement  ADL's:  Intact  Cognition: WNL  Sleep:  {B[facility] GOOD/FAIR/POOR:22877}   Screenings: PHQ2-9   Flowsheet Row Video Visit from 04/20/2020 in [facility]  PHQ-2 Total Score 6  PHQ-9 Total Score 11    Flowsheet Row Video Visit from 04/20/2020 in [facility]  C-SSRS RISK CATEGORY No Risk       Assessment and Plan:  ARDYN FORGE is a 65 y.o. year old female with a history of depression,COPD, hypertension, who presents for follow up appointment for  below.    1. Anxiety 2. MDD (major depressive disorder), recurrent episode, mild (HCC) She reports slight worsening in depressive symptoms in the context of her son relapsed in drug use in prison.  Other psychosocial stressors includes work, and grief of loss of her husband in March 2018.  Will add mirtazapine to target depression.  Discussed potential metabolic side effect and drowsiness.  Will taper down Abilify given head tremors she has.  Discussed potential metabolic side effect and EPS.  Will continue temazepam as needed for anxiety.  She is aware of its potential risk of dependence and oversedation especially with concomitant use of opioid.  Will discontinue trazodone due  to adverse reaction of drowsiness.    Plan I have reviewed and updated plans as below 1. Continue lexapro 20 mg daily 2.Decrease Abilify10 mg daily- monitor weight gain, EPS 3. Discontinue Trazodone  4. Start mirtazapine 7.5 mg at night for one week, then 15 mg at night  5. Continuelorazepam 0.5 mg as needed for anxiety  5. Next appointment-4/18 at 3 PM for 30 mins, in person  - on melatonin 3 mg at night - She will have annual exam in April. She is advised to check TSH  Past trials of medication:sertraline, fluoxetine (became emotional), bupropion,  Abilify, trazodone (drowsiness), Lunesta  The patient demonstrates the following risk factors for suicide: Chronic risk factors for suicide include:psychiatric disorder ofdepression, anxiety. Acute risk factorsfor suicide include: loss (financial, interpersonal, professional). Protective factorsfor this patient include: positive social support, responsibility to others (children, family), coping skills and hope for the future. Considering these factors, the overall suicide risk at this point appears to below. Patientisappropriate for outpatient follow up.   Neysa Hotter, MD 05/19/2020, 11:10 AM

## 2020-05-24 ENCOUNTER — Other Ambulatory Visit: Payer: Self-pay

## 2020-05-24 ENCOUNTER — Ambulatory Visit: Payer: 59 | Admitting: Psychiatry

## 2020-05-31 ENCOUNTER — Other Ambulatory Visit: Payer: Self-pay | Admitting: Psychiatry

## 2020-05-31 ENCOUNTER — Telehealth: Payer: Self-pay

## 2020-05-31 DIAGNOSIS — F3341 Major depressive disorder, recurrent, in partial remission: Secondary | ICD-10-CM

## 2020-05-31 MED ORDER — ESCITALOPRAM OXALATE 20 MG PO TABS: 20.0000 mg | ORAL_TABLET | Freq: Every day | ORAL | 0 refills | Status: DC

## 2020-05-31 NOTE — Progress Notes (Addendum)
BH MD/PA/NP OP Progress Note  06/07/2020 5:00 PM [name]  MRN:  [id number]  Chief Complaint:  Chief Complaint    Follow-up; Depression     HPI:  This is a follow-up appointment for depression and anxiety . She states that she has been doing better since starting mirtazapine.  However, she continues to have lack of motivation.  She has been able to make herself go to work, although she feels stressed there.  She enjoyed going to baby shower, and birthday party of her family.  She talks with her son every other day.  She occasionally wishes that she can be more helpful to him.  She wishes to be more emotional help to him.  She agrees that he may use drug due to this emotional issues.  However, she also agrees to have healthy boundary as he is an adult.  She has depressive symptoms as in PHQ-9.  She feels that her tremors has been improved since lowering the dose of Abilify.  She occasionally feels anxious; she takes lorazepam when she drives or overwhelmed at work.  She denies alcohol use or drug use.    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 MDD (major depressive disorder), recurrent episode, mild (HCC)  F33.0   2. Anxiety  F41.9     Past Psychiatric History: Please see initial evaluation for full details. I have reviewed the history. No updates at this time.     Past Medical History:  Past Medical History:  Diagnosis Date  . Anxiety   . Arthritis   . Complication of anesthesia   . Depression   . Hypertension   . PONV (postoperative nausea and vomiting)     Past Surgical History:  Procedure  Laterality Date  . FOOT NEUROMA SURGERY     right foot  . KNEE SURGERY     right arthroscopic  . 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 TOTAL KNEE ARTHROPLASTY Right 04/09/2018   Procedure: TOTAL KNEE ARTHROPLASTY;  Surgeon: Durene Romans, MD;  Location: [facility];  Service: Orthopedics;  Laterality: Right;  70 mins    Family Psychiatric History: Please see initial evaluation for full details. I have reviewed the history. No updates at this time.     Family History:  Family History  Problem Relation Age of Onset  . Drug abuse Son     Social History:  Social History   Socioeconomic History  . Marital status: Widowed    Spouse name: Not on file  . Number of children: Not on file  . Years of education: Not on file  . Highest education level: Not on file  Occupational History  . Not on file  Tobacco Use  . Smoking status: Current Every Day Smoker    Packs/day: 0.25    Years: 40.00    Pack years: 10.00    Types: Cigarettes    Start date: 10/15/2017  . Smokeless tobacco: Never Used  . Tobacco  comment: smokes 2 cigarettes a day  Vaping Use  . Vaping Use: Never used  Substance and Sexual Activity  . Alcohol use: Yes    Comment: occasional. 11-15-2016 occa  . Drug use: No    Comment: 11-15-2016 per pt no  . Sexual activity: Not Currently  Other Topics Concern  . Not on file  Social History Narrative  . Not on file   Social Determinants of Health   Financial Resource Strain: Not on file  Food Insecurity: Not on file  Transportation Needs: Not on file  Physical Activity: Not on file  Stress: Not on file  Social Connections: Not on file    Allergies:  Allergies  Allergen Reactions  . Percocet [Oxycodone-Acetaminophen] Nausea And Vomiting    Metabolic Disorder Labs: No results found for: HGBA1C, MPG No results found for:  PROLACTIN No results found for: CHOL, TRIG, HDL, CHOLHDL, VLDL, LDLCALC  Therapeutic Level Labs: No results found for: LITHIUM No results found for: VALPROATE No components found for:  CBMZ  Current Medications: Current Outpatient Medications  Medication Sig Dispense Refill  . albuterol (VENTOLIN HFA) 108 (90 Base) MCG/ACT inhaler albuterol sulfate HFA 90 mcg/actuation aerosol inhaler  INL 2 PFS PO QID    . amLODipine (NORVASC) 10 MG tablet Take 10 mg by mouth daily.     . ARIPiprazole (ABILIFY) 10 MG tablet Take 1 tablet (10 mg total) by mouth daily. 30 tablet 1  . ARIPiprazole (ABILIFY) 5 MG tablet Take 1 tablet (5 mg total) by mouth daily. 90 tablet 0  . escitalopram (LEXAPRO) 20 MG tablet Take 1 tablet (20 mg total) by mouth daily. 90 tablet 0  . LORazepam (ATIVAN) 0.5 MG tablet Take 1 tablet (0.5 mg total) by mouth daily as needed for anxiety. 30 tablet 0  . metoprolol tartrate (LOPRESSOR) 25 MG tablet Take 25 mg by mouth 2 (two) times daily.    . SYMBICORT 160-4.5 MCG/ACT inhaler Inhale 1 puff into the lungs daily.    . mirtazapine (REMERON) 30 MG tablet Take 1 tablet (30 mg total) by mouth at bedtime. 90 tablet 0   No current facility-administered medications for this visit.     Musculoskeletal: Strength & Muscle Tone: N/A Gait & Station: N/A Patient leans: N/A  Psychiatric Specialty Exam: Review of Systems  Psychiatric/Behavioral: Positive for dysphoric mood. Negative for agitation, behavioral problems, confusion, decreased concentration, hallucinations, self-injury, sleep disturbance and suicidal ideas. The patient is nervous/anxious. The patient is not hyperactive.   All other systems reviewed and are negative.   Blood pressure 125/64, pulse 80, temperature 98.1 F (36.7 C), temperature source Temporal, weight 146 lb 12.8 oz (66.6 kg).Body mass index is 28.67 kg/m.  General Appearance: Fairly Groomed, constantly moving her knee  Eye Contact:  Good  Speech:  Clear  and Coherent  Volume:  Normal  Mood:  better  Affect:  Appropriate, Congruent and euthymic  Thought Process:  Coherent  Orientation:  Full (Time, Place, and Person)  Thought Content: Logical   Suicidal Thoughts:  No  Homicidal Thoughts:  No  Memory:  Immediate;   Good  Judgement:  Good  Insight:  Good  Psychomotor Activity:  Normal. Cogwheel rigidity on bilateral arms, no resting tremors  Concentration:  Concentration: Good and Attention Span: Good  Recall:  Good  Fund of Knowledge: Good  Language: Good  Akathisia:  No  Handed:  Right  AIMS (if indicated): not done  Assets:  Communication Skills Desire for Improvement  ADL's:  Intact  Cognition: WNL  Sleep:  Fair   Screenings: PHQ2-9   Flowsheet Row Office Visit from 06/07/2020 in [facility] Video Visit from 04/20/2020 in [facility]  PHQ-2 Total Score 2 6  PHQ-9 Total Score 4 11    Flowsheet Row Office Visit from 06/07/2020 in [facility] Video Visit from 04/20/2020 in [facility]  C-SSRS RISK CATEGORY No Risk No Risk       Assessment and Plan:  [name] is a 65 y.o. year old female with a history of depression,COPD, hypertension, who presents for follow up appointment for below.   1. Anxiety 2. MDD (major depressive disorder), recurrent episode, mild (HCC) She reports improvement in depressive symptoms since starting mirtazapine.  Psychosocial stressors includes her son, who relapsed in drug use while in prison, work, grief of loss of her husband in March 2018.  Will uptitrate mirtazapine to optimize treatment for depression and anxiety.  Discussed potential metabolic side effect and drowsiness.  Will taper down Abilify given cogwheel rigidity on exam.  Will continue lorazepam as needed for anxiety.   This clinician has discussed the side effect associated with medication prescribed during this  encounter. Please refer to notes in the previous encounters for more details.   Plan I have reviewed and updated plans as below 1. Continue lexapro 20 mg daily 2.Decrease Abilify5 mg daily- monitor cogwheel rigidity 3. Increase mirtazapine 30 mg at night  4. Continuelorazepam 0.5 mg as needed for anxiety  5. Next appointment-6/14 at 4:30 for 30 mins, video- will consider in person visit for the following appt - on melatonin 3 mg at night - She will have annual exam in April. She is advised to check TSH  Past trials of medication:sertraline, fluoxetine (became emotional), bupropion,  Abilify, trazodone (drowsiness), Lunesta  The patient demonstrates the following risk factors for suicide: Chronic risk factors for suicide include:psychiatric disorder ofdepression, anxiety. Acute risk factorsfor suicide include: loss (financial, interpersonal, professional). Protective factorsfor this patient include: positive social support, responsibility to others (children, family), coping skills and hope for the future. Considering these factors, the overall suicide risk at this point appears to below. Patientisappropriate for outpatient follow up.   Neysa Hotter, MD 06/07/2020, 5:00 PM

## 2020-05-31 NOTE — Telephone Encounter (Signed)
Ordered

## 2020-06-07 ENCOUNTER — Other Ambulatory Visit: Payer: Self-pay

## 2020-06-07 ENCOUNTER — Encounter: Payer: Self-pay | Admitting: Psychiatry

## 2020-06-07 ENCOUNTER — Ambulatory Visit (INDEPENDENT_AMBULATORY_CARE_PROVIDER_SITE_OTHER): Payer: 59 | Admitting: Psychiatry

## 2020-06-07 VITALS — BP 125/64 | HR 80 | Temp 98.1°F | Wt 146.8 lb

## 2020-06-07 DIAGNOSIS — F330 Major depressive disorder, recurrent, mild: Secondary | ICD-10-CM

## 2020-06-07 DIAGNOSIS — F419 Anxiety disorder, unspecified: Secondary | ICD-10-CM

## 2020-06-07 MED ORDER — MIRTAZAPINE 30 MG PO TABS: 30.0000 mg | ORAL_TABLET | Freq: Every day | ORAL | 0 refills | Status: DC

## 2020-06-07 MED ORDER — ARIPIPRAZOLE 5 MG PO TABS: 5.0000 mg | ORAL_TABLET | Freq: Every day | ORAL | 0 refills | Status: DC

## 2020-06-08 ENCOUNTER — Ambulatory Visit: Payer: 59 | Admitting: Psychiatry

## 2020-06-08 MED ORDER — LORAZEPAM 0.5 MG PO TABS: 0.5000 mg | ORAL_TABLET | Freq: Every day | ORAL | 2 refills | Status: DC | PRN

## 2020-06-08 NOTE — Addendum Note (Signed)
Addended by: Neysa Hotter on: 06/08/2020 09:01 AM   Modules accepted: Orders

## 2020-07-20 ENCOUNTER — Telehealth: Payer: 59 | Admitting: Psychiatry

## 2020-07-29 ENCOUNTER — Encounter: Payer: Self-pay | Admitting: Psychiatry

## 2020-07-29 ENCOUNTER — Telehealth (INDEPENDENT_AMBULATORY_CARE_PROVIDER_SITE_OTHER): Payer: 59 | Admitting: Psychiatry

## 2020-07-29 ENCOUNTER — Other Ambulatory Visit: Payer: Self-pay

## 2020-07-29 DIAGNOSIS — F419 Anxiety disorder, unspecified: Secondary | ICD-10-CM | POA: Diagnosis not present

## 2020-07-29 DIAGNOSIS — F3341 Major depressive disorder, recurrent, in partial remission: Secondary | ICD-10-CM | POA: Diagnosis not present

## 2020-07-29 MED ORDER — MIRTAZAPINE 30 MG PO TABS: 30.0000 mg | ORAL_TABLET | Freq: Every day | ORAL | 0 refills | Status: DC

## 2020-07-29 MED ORDER — ARIPIPRAZOLE 5 MG PO TABS: 5.0000 mg | ORAL_TABLET | Freq: Every day | ORAL | 0 refills | Status: DC

## 2020-07-29 NOTE — Patient Instructions (Signed)
1. Continue lexapro 20 mg daily  2. Continue Abilify 5  mg daily  3  Continue mirtazapine 30 mg at night  4. Continue lorazepam 0.5 mg as needed for anxiety 5. Next appointment- 8/18 at 1 PM

## 2020-07-29 NOTE — Progress Notes (Signed)
Virtual Visit via Video Note  I connected with [name] on 07/29/20 at  9:40 AM EDT by a video enabled telemedicine application and verified that I am speaking with the correct person using two identifiers.  Location: Patient: home Provider: office Persons participated in the visit- patient, provider    I discussed the limitations of evaluation and management by telemedicine and the availability of in person appointments. The patient expressed understanding and agreed to proceed.    I discussed the assessment and treatment plan with the patient. The patient was provided an opportunity to ask questions and all were answered. The patient agreed with the plan and demonstrated an understanding of the instructions.   The patient was advised to call back or seek an in-person evaluation if the symptoms worsen or if the condition fails to improve as anticipated.  I provided 10 minutes of non-face-to-face time during this encounter.   Neysa Hotter, MD     [facility] MD/PA/NP OP Progress Note  07/29/2020 10:02 AM [name]  MRN:  [id number]  Chief Complaint:  Chief Complaint   Follow-up; Depression; Anxiety    HPI:  This is a follow-up appointment for depression and anxiety.  She states that she has been doing better since the last visit.  She is more up during the day.  She goes to shopping.  She enjoyed seeing her brother-in-law the other day.  She is hoping to do more things during the day.  She denies any issues at work.  She was able to reduce the dose of Abilify; she has not noticed much change.  When she was asked about tremors in her hands, she states that she is this way in the morning, although it usually subsides later in the day.  She sleeps better.  She denies feeling depressed.  She denies change in appetite.  She denies SI.  Although she occasionally feels anxious, it has been better.  She takes lorazepam once a week or less.  She denies panic attacks.  She denies  alcohol use or drug use.    Daily routine: stays in the bed most of the time Employment: Social research officer, government for seven years  Support: neighbor Household: by herself Marital status: widow Number of children: 1 son in prison, 15 more years due to robbing the drug dealer   Visit Diagnosis:    ICD-10-CM   1. MDD (major depressive disorder), recurrent, in partial remission (HCC)  F33.41     2. Anxiety  F41.9       Past Psychiatric History: Please see initial evaluation for full details. I have reviewed the history. No updates at this time.     Past Medical History:  Past Medical History:  Diagnosis Date   Anxiety    Arthritis    Complication of anesthesia    Depression    Hypertension    PONV (postoperative nausea and vomiting)     Past Surgical History:  Procedure Laterality Date   FOOT NEUROMA SURGERY     right foot   KNEE SURGERY     right arthroscopic   TOTAL KNEE ARTHROPLASTY Left 03/05/2018   Procedure: TOTAL KNEE ARTHROPLASTY;  Surgeon: Durene Romans, MD;  Location: [facility];  Service: Orthopedics;  Laterality: Left;  70 minutes   TOTAL KNEE ARTHROPLASTY Right 04/09/2018   Procedure: TOTAL KNEE ARTHROPLASTY;  Surgeon: Durene Romans, MD;  Location: [facility];  Service: Orthopedics;  Laterality: Right;  70 mins    Family Psychiatric History: Please see  initial evaluation for full details. I have reviewed the history. No updates at this time.     Family History:  Family History  Problem Relation Age of Onset   Drug abuse Son     Social History:  Social History   Socioeconomic History   Marital status: Widowed    Spouse name: Not on file   Number of children: Not on file   Years of education: Not on file   Highest education level: Not on file  Occupational History   Not on file  Tobacco Use   Smoking status: Every Day    Packs/day: 0.25    Years: 40.00    Pack years: 10.00    Types: Cigarettes    Start date: 10/15/2017   Smokeless tobacco: Never   Tobacco  comments:    smokes 2 cigarettes a day  Vaping Use   Vaping Use: Never used  Substance and Sexual Activity   Alcohol use: Yes    Comment: occasional. 11-15-2016 occa   Drug use: No    Comment: 11-15-2016 per pt no   Sexual activity: Not Currently  Other Topics Concern   Not on file  Social History Narrative   Not on file   Social Determinants of Health   Financial Resource Strain: Not on file  Food Insecurity: Not on file  Transportation Needs: Not on file  Physical Activity: Not on file  Stress: Not on file  Social Connections: Not on file    Allergies:  Allergies  Allergen Reactions   Percocet [Oxycodone-Acetaminophen] Nausea And Vomiting    Metabolic Disorder Labs: No results found for: HGBA1C, MPG No results found for: PROLACTIN No results found for: CHOL, TRIG, HDL, CHOLHDL, VLDL, LDLCALC tic Level Labs: No results found for: LITHIUM No results found for: VALPROATE No components found for:  CBMZ  Current Medications: Current Outpatient Medications  Medication Sig Dispense Refill   albuterol (VENTOLIN HFA) 108 (90 Base) MCG/ACT inhaler albuterol sulfate HFA 90 mcg/actuation aerosol inhaler  INL 2 PFS PO QID     amLODipine (NORVASC) 10 MG tablet Take 10 mg by mouth daily.      [START ON 09/06/2020] ARIPiprazole (ABILIFY) 5 MG tablet Take 1 tablet (5 mg total) by mouth daily. 90 tablet 0   escitalopram (LEXAPRO) 20 MG tablet Take 1 tablet (20 mg total) by mouth daily. 90 tablet 0   LORazepam (ATIVAN) 0.5 MG tablet Take 1 tablet (0.5 mg total) by mouth daily as needed for anxiety. 30 tablet 2   metoprolol tartrate (LOPRESSOR) 25 MG tablet Take 25 mg by mouth 2 (two) times daily.     [START ON 09/06/2020] mirtazapine (REMERON) 30 MG tablet Take 1 tablet (30 mg total) by mouth at bedtime. 90 tablet 0   SYMBICORT 160-4.5 MCG/ACT inhaler Inhale 1 puff into the lungs daily.     No current facility-administered medications for this visit.     Musculoskeletal: Strength  & Muscle Tone:  N/A Gait & Station:  N/A Patient leans: N/A  Psychiatric Specialty Exam: Review of Systems  Psychiatric/Behavioral:  Positive for sleep disturbance. Negative for agitation, behavioral problems, confusion, decreased concentration, dysphoric mood, hallucinations, self-injury and suicidal ideas. The patient is nervous/anxious. The patient is not hyperactive.   All other systems reviewed and are negative.  There were no vitals taken for this visit.There is no height or weight on file to calculate BMI.  General Appearance: Fairly Groomed  Eye Contact:  Good  Speech:  Clear and Coherent  Volume:  Normal  Mood:   better  Affect:  Appropriate, Congruent, and euthymic  Thought Process:  Coherent and Goal Directed  Orientation:  Full (Time, Place, and Person)  Thought Content: Logical   Suicidal Thoughts:  No  Homicidal Thoughts:  No  Memory:  Immediate;   Good  Judgement:  Good  Insight:  Good  Psychomotor Activity:  Normal- visible hand tremors on video.  Concentration:  Concentration: Good and Attention Span: Good  Recall:  Good  Fund of Knowledge: Good  Language: Good  Akathisia:  No  Handed:  Right  AIMS (if indicated): not done  Assets:  Communication Skills Desire for Improvement  ADL's:  Intact  Cognition: WNL  Sleep:  Fair   Screenings: PHQ2-9    Flowsheet Row Video Visit from 07/29/2020 in [facility] Office Visit from 06/07/2020 in [facility] Video Visit from 04/20/2020 in [facility]  PHQ-2 Total Score 0 2 6  PHQ-9 Total Score -- 4 11      Flowsheet Row Video Visit from 07/29/2020 in [facility] Office Visit from 06/07/2020 in [facility] Video Visit from 04/20/2020 in [facility]  C-SSRS RISK CATEGORY No Risk No Risk No Risk        Assessment and Plan:  [name] is a 65  y.o. year old female with a history of depression, COPD, hypertension,, who presents for follow up appointment for below.   1. MDD (major depressive disorder), recurrent, in partial remission (HCC) 2. Anxiety There has been overall improvement in depression and anxiety since up titration of mirtazapine. Psychosocial stressors includes her son, who relapsed in drug use while in prison, work, grief of loss of her husband in March 2018.  Will continue current dose of Lexapro and mirtazapine to target depression and anxiety.  Will continue Abilify as adjunctive treatment for depression given the patient preference; will consider tapering down in the future to avoid long-term side effect.  Will continue lorazepam as needed for anxiety.   This clinician has discussed the side effect associated with medication prescribed during this encounter. Please refer to notes in the previous encounters for more details.     Plan I have reviewed and updated plans as below 1. Continue lexapro 20 mg daily  2. Continue Abilify 5  mg daily - monitor cogwheel rigidity (was on 10 mg) 3  Continue mirtazapine 30 mg at night  4. Continue lorazepam 0.5 mg as needed for anxiety - refills left 5. Next appointment- 8/18 at 1 PM for 20 mins, video- will consider in person visit for the following appt - on melatonin 3 mg at night  - She will have annual exam in April. She is advised to check TSH   Past trials of medication: sertraline, fluoxetine (became emotional), bupropion,  Abilify, trazodone (drowsiness), Lunesta   The patient demonstrates the following risk factors for suicide: Chronic risk factors for suicide include: psychiatric disorder of depression, anxiety . Acute risk factors for suicide include: loss (financial, interpersonal, professional). Protective factors for this patient include: positive social support, responsibility to others (children, family), coping skills and hope for the future. Considering these  factors, the overall suicide risk at this point appears to be low. Patient is appropriate for outpatient follow up.  Neysa Hotter, MD 07/29/2020, 10:02 AM

## 2020-09-06 ENCOUNTER — Telehealth: Payer: Self-pay

## 2020-09-06 DIAGNOSIS — F3341 Major depressive disorder, recurrent, in partial remission: Secondary | ICD-10-CM

## 2020-09-06 MED ORDER — ESCITALOPRAM OXALATE 20 MG PO TABS: 20.0000 mg | ORAL_TABLET | Freq: Every day | ORAL | 0 refills | Status: DC

## 2020-09-06 NOTE — Telephone Encounter (Signed)
I have sent Lexapro to pharmacy. 

## 2020-09-06 NOTE — Telephone Encounter (Signed)
pt needs a refill on the secitalopram 20mg  dr. next appt is 09-23-20

## 2020-09-22 NOTE — Progress Notes (Signed)
Virtual Visit via Video Note  I connected with [name] on 09/23/20 at  1:00 PM EDT by a video enabled telemedicine application and verified that I am speaking with the correct person using two identifiers.  Location: Patient: home Provider: office Persons participated in the visit- patient, provider    I discussed the limitations of evaluation and management by telemedicine and the availability of in person appointments. The patient expressed understanding and agreed to proceed.     I discussed the assessment and treatment plan with the patient. The patient was provided an opportunity to ask questions and all were answered. The patient agreed with the plan and demonstrated an understanding of the instructions.   The patient was advised to call back or seek an in-person evaluation if the symptoms worsen or if the condition fails to improve as anticipated.  I provided 9 minutes of non-face-to-face time during this encounter.   Neysa Hotter, MD    [facility] MD/PA/NP OP Progress Note  09/23/2020 1:17 PM [name]  MRN:  [id number]  Chief Complaint:  Chief Complaint   Follow-up; Depression    HPI:  This is a follow-up appointment for depression and anxiety.  She states that she has been doing well.  She went on a vacation to [place] with her daughter-in-law, and her grandson.  She enjoyed the time there.  She has not been able to contact with her son as he is on some program.  She is planning to talk with him again on the phone once he has this privilege back in a few weeks.  She goes to work regularly.  She has been handling things well.  Although she feels fatigued during the day, she denies any concern at this time.  She wants to stay on the Abilify as she is concern of feeling low again.  She has fair sleep.  She denies change in weight or appetite.  She has fair concentration.  She denies SI.  She feels less anxious.  She rarely needed to take lorazepam since the  last visit for anxiety.  She denies panic attacks.    Employment: Social research officer, government for seven years  Support: neighbor Household: by herself Marital status: widow Number of children: 1 son in prison, 15 more years due to robbing the drug dealer   Visit Diagnosis:    ICD-10-CM   1. MDD (major depressive disorder), recurrent, in partial remission (HCC)  F33.41     2. Anxiety  F41.9 LORazepam (ATIVAN) 0.5 MG tablet      Past Psychiatric History: Please see initial evaluation for full details. I have reviewed the history. No updates at this time.     Past Medical History:  Past Medical History:  Diagnosis Date   Anxiety    Arthritis    Complication of anesthesia    Depression    Hypertension    PONV (postoperative nausea and vomiting)     Past Surgical History:  Procedure Laterality Date   FOOT NEUROMA SURGERY     right foot   KNEE SURGERY     right arthroscopic   TOTAL KNEE ARTHROPLASTY Left 03/05/2018   Procedure: TOTAL KNEE ARTHROPLASTY;  Surgeon: Durene Romans, MD;  Location: [facility];  Service: Orthopedics;  Laterality: Left;  70 minutes   TOTAL KNEE ARTHROPLASTY Right 04/09/2018   Procedure: TOTAL KNEE ARTHROPLASTY;  Surgeon: Durene Romans, MD;  Location: [facility];  Service: Orthopedics;  Laterality: Right;  70 mins    Family Psychiatric History:  Please see initial evaluation for full details. I have reviewed the history. No updates at this time.     Family History:  Family History  Problem Relation Age of Onset   Drug abuse Son     Social History:  Social History   Socioeconomic History   Marital status: Widowed    Spouse name: Not on file   Number of children: Not on file   Years of education: Not on file   Highest education level: Not on file  Occupational History   Not on file  Tobacco Use   Smoking status: Every Day    Packs/day: 0.25    Years: 40.00    Pack years: 10.00    Types: Cigarettes    Start date: 10/15/2017   Smokeless tobacco: Never   Tobacco  comments:    smokes 2 cigarettes a day  Vaping Use   Vaping Use: Never used  Substance and Sexual Activity   Alcohol use: Yes    Comment: occasional. 11-15-2016 occa   Drug use: No    Comment: 11-15-2016 per pt no   Sexual activity: Not Currently  Other Topics Concern   Not on file  Social History Narrative   Not on file   Social Determinants of Health   Financial Resource Strain: Not on file  Food Insecurity: Not on file  Transportation Needs: Not on file  Physical Activity: Not on file  Stress: Not on file  Social Connections: Not on file    Allergies:  Allergies  Allergen Reactions   Percocet [Oxycodone-Acetaminophen] Nausea And Vomiting    Metabolic Disorder Labs: No results found for: HGBA1C, MPG No results found for: PROLACTIN No results found for: CHOL, TRIG, HDL, CHOLHDL, VLDL, LDLCALC   Therapeutic Level Labs: No results found for: LITHIUM No results found for: VALPROATE No components found for:  CBMZ  Current Medications: Current Outpatient Medications  Medication Sig Dispense Refill   albuterol (VENTOLIN HFA) 108 (90 Base) MCG/ACT inhaler albuterol sulfate HFA 90 mcg/actuation aerosol inhaler  INL 2 PFS PO QID     amLODipine (NORVASC) 10 MG tablet Take 10 mg by mouth daily.      ARIPiprazole (ABILIFY) 5 MG tablet Take 1 tablet (5 mg total) by mouth daily. 90 tablet 0   escitalopram (LEXAPRO) 20 MG tablet Take 1 tablet (20 mg total) by mouth daily. 90 tablet 0   [START ON 10/07/2020] LORazepam (ATIVAN) 0.5 MG tablet Take 1 tablet (0.5 mg total) by mouth daily as needed for anxiety. 30 tablet 2   metoprolol tartrate (LOPRESSOR) 25 MG tablet Take 25 mg by mouth 2 (two) times daily.     mirtazapine (REMERON) 30 MG tablet Take 1 tablet (30 mg total) by mouth at bedtime. 90 tablet 0   SYMBICORT 160-4.5 MCG/ACT inhaler Inhale 1 puff into the lungs daily.     No current facility-administered medications for this visit.     Musculoskeletal: Strength &  Muscle Tone:  N/A Gait & Station:  N/A Patient leans: N/A  Psychiatric Specialty Exam: Review of Systems  Psychiatric/Behavioral:  Negative for agitation, behavioral problems, confusion, decreased concentration, dysphoric mood, hallucinations, self-injury, sleep disturbance and suicidal ideas. The patient is nervous/anxious. The patient is not hyperactive.   All other systems reviewed and are negative.  There were no vitals taken for this visit.There is no height or weight on file to calculate BMI.  General Appearance: Fairly Groomed  Eye Contact:  Good  Speech:  Clear and Coherent  Volume:  Normal  Mood:   good  Affect:  Appropriate, Congruent, and euthymic  Thought Process:  Coherent  Orientation:  Full (Time, Place, and Person)  Thought Content: Logical   Suicidal Thoughts:  No  Homicidal Thoughts:  No  Memory:  Immediate;   Good  Judgement:  Good  Insight:  Good  Psychomotor Activity:  Normal  Concentration:  Concentration: Good and Attention Span: Good  Recall:  Good  Fund of Knowledge: Good  Language: Good  Akathisia:  No  Handed:  Right  AIMS (if indicated): not done  Assets:  Communication Skills Desire for Improvement  ADL's:  Intact  Cognition: WNL  Sleep:  Good   Screenings: PHQ2-9    Flowsheet Row Video Visit from 09/23/2020 in [facility] Video Visit from 07/29/2020 in [facility] Office Visit from 06/07/2020 in [facility] Video Visit from 04/20/2020 in [facility]  PHQ-2 Total Score 0 0 2 6  PHQ-9 Total Score -- -- 4 11      Flowsheet Row Video Visit from 09/23/2020 in [facility] Video Visit from 07/29/2020 in [facility] Office Visit from 06/07/2020 in [facility]  C-SSRS RISK CATEGORY No Risk No Risk No Risk        Assessment and Plan:  [name] is a 64 y.o. year old female with a history of depression, COPD, hypertension, who presents for follow up appointment for below.   1. Anxiety 2. MDD (major depressive disorder), recurrent, in partial remission (HCC) There has been overall improvement in depressive symptoms and an anxiety since up titration of mirtazapine. Psychosocial stressors includes her son, who relapsed in drug use while in prison, work, grief of loss of her husband in March 2018.  Will continue current dose of Lexapro and mirtazapine to target depression and anxiety.  Will continue Abilify adjunctive treatment for depression.  Noted that although it was discussed with tapering down this medication with concern of EPS, she would like to stay on the current dose at this time.  Will continue to discuss as needed.  Will continue lorazepam as needed for anxiety.    This clinician has discussed the side effect associated with medication prescribed during this encounter. Please refer to notes in the previous encounters for more details.    Plan I have reviewed and updated plans as below  1. Continue lexapro 20 mg daily  2. Continue Abilify 5  mg daily - monitor cogwheel rigidity (was on 10 mg) 3  Continue mirtazapine 30 mg at night  4. Continue lorazepam 0.5 mg as needed for anxiety 5. Next appointment- 10/20 at 1:40, video  - on melatonin 3 mg at night  - She will have annual exam She is advised to check TSH   Past trials of medication: sertraline, fluoxetine (became emotional), bupropion,  Abilify, trazodone (drowsiness), Lunesta   The patient demonstrates the following risk factors for suicide: Chronic risk factors for suicide include: psychiatric disorder of depression, anxiety . Acute risk factors for suicide include: loss (financial, interpersonal, professional). Protective factors for this patient include: positive social support, responsibility to others (children, family), coping skills and hope for the future.  Considering these factors, the overall suicide risk at this point appears to be low. Patient is appropriate for outpatient follow up.       Neysa Hotter, MD 09/23/2020, 1:17 PM

## 2020-09-23 ENCOUNTER — Encounter: Payer: Self-pay | Admitting: Psychiatry

## 2020-09-23 ENCOUNTER — Other Ambulatory Visit: Payer: Self-pay

## 2020-09-23 ENCOUNTER — Telehealth (INDEPENDENT_AMBULATORY_CARE_PROVIDER_SITE_OTHER): Payer: 59 | Admitting: Psychiatry

## 2020-09-23 DIAGNOSIS — F419 Anxiety disorder, unspecified: Secondary | ICD-10-CM | POA: Diagnosis not present

## 2020-09-23 DIAGNOSIS — F3341 Major depressive disorder, recurrent, in partial remission: Secondary | ICD-10-CM

## 2020-09-23 MED ORDER — LORAZEPAM 0.5 MG PO TABS: 0.5000 mg | ORAL_TABLET | Freq: Every day | ORAL | 2 refills | Status: AC | PRN

## 2020-09-23 NOTE — Patient Instructions (Signed)
1. Continue lexapro 20 mg daily  2. Continue Abilify 5  mg daily - monitor cogwheel rigidity  3  Continue mirtazapine 30 mg at night  4. Continue lorazepam 0.5 mg as needed for anxiety 5. Next appointment- 10/20 at 1:40

## 2020-11-24 NOTE — Progress Notes (Signed)
Virtual Visit via Video Note  I connected with [name] on 11/25/20 at  1:40 PM EDT by a video enabled telemedicine application and verified that I am speaking with the correct person using two identifiers.  Location: Patient: home Provider: office Persons participated in the visit- patient, provider    I discussed the limitations of evaluation and management by telemedicine and the availability of in person appointments. The patient expressed understanding and agreed to proceed.   I discussed the assessment and treatment plan with the patient. The patient was provided an opportunity to ask questions and all were answered. The patient agreed with the plan and demonstrated an understanding of the instructions.   The patient was advised to call back or seek an in-person evaluation if the symptoms worsen or if the condition fails to improve as anticipated.  I provided 10 minutes of non-face-to-face time during this encounter.   Faith Hotter, MD   [facility] MD/PA/NP OP Progress Note  11/25/2020 1:56 PM [name]  MRN:  [id number]  Chief Complaint:  Chief Complaint   Follow-up; Depression    HPI:  This is a follow-up appointment for depression.  She states that she has been doing well.  She tries to go outside more frequently.  She meets with her daughter-in-law and her children.  She has been doing well compared to before.  Her son will be in jail for another 10 years.  Although she feels sad about this, there is nothing she can do.  She contacts him twice a week.  He has been doing well.  Her work has been doing good.  She denies feeling depressed.  She has occasional anhedonia.  She tends to feel anxious when she encounters new things at work.  She denies panic attacks.  She has not taken Ativan as much since the last visit.  She denies change in weight or appetite.  She denies SI.  She is now willing to try a lower dose of Abilify.   Employment: Social research officer, government for seven  years  Support: neighbor Household: by herself Marital status: widow Number of children: 1 son in prison, 15 more years due to robbing the drug dealer   Visit Diagnosis:    ICD-10-CM   1. Anxiety  F41.9     2. MDD (major depressive disorder), recurrent, in partial remission (HCC)  F33.41 escitalopram (LEXAPRO) 20 MG tablet      Past Psychiatric History: Please see initial evaluation for full details. I have reviewed the history. No updates at this time.     Past Medical History:  Past Medical History:  Diagnosis Date   Anxiety    Arthritis    Complication of anesthesia    Depression    Hypertension    PONV (postoperative nausea and vomiting)     Past Surgical History:  Procedure Laterality Date   FOOT NEUROMA SURGERY     right foot   KNEE SURGERY     right arthroscopic   TOTAL KNEE ARTHROPLASTY Left 03/05/2018   Procedure: TOTAL KNEE ARTHROPLASTY;  Surgeon: Durene Romans, MD;  Location: [facility];  Service: Orthopedics;  Laterality: Left;  70 minutes   TOTAL KNEE ARTHROPLASTY Right 04/09/2018   Procedure: TOTAL KNEE ARTHROPLASTY;  Surgeon: Durene Romans, MD;  Location: [facility];  Service: Orthopedics;  Laterality: Right;  70 mins    Family Psychiatric History: Please see initial evaluation for full details. I have reviewed the history. No updates at this time.  Family History:  Family History  Problem Relation Age of Onset   Drug abuse Son     Social History:  Social History   Socioeconomic History   Marital status: Widowed    Spouse name: Not on file   Number of children: Not on file   Years of education: Not on file   Highest education level: Not on file  Occupational History   Not on file  Tobacco Use   Smoking status: Every Day    Packs/day: 0.25    Years: 40.00    Pack years: 10.00    Types: Cigarettes    Start date: 10/15/2017   Smokeless tobacco: Never   Tobacco comments:    smokes 2 cigarettes a day  Vaping Use   Vaping Use: Never used   Substance and Sexual Activity   Alcohol use: Yes    Comment: occasional. 11-15-2016 occa   Drug use: No    Comment: 11-15-2016 per pt no   Sexual activity: Not Currently  Other Topics Concern   Not on file  Social History Narrative   Not on file   Social Determinants of Health   Financial Resource Strain: Not on file  Food Insecurity: Not on file  Transportation Needs: Not on file  Physical Activity: Not on file  Stress: Not on file  Social Connections: Not on file    Allergies:  Allergies  Allergen Reactions   Percocet [Oxycodone-Acetaminophen] Nausea And Vomiting    Metabolic Disorder Labs: No results found for: HGBA1C, MPG No results found for: PROLACTIN No results found for: CHOL, TRIG, HDL, CHOLHDL, VLDL, LDLCALC  Therapeutic Level Labs: No results found for: LITHIUM No results found for: VALPROATE No components found for:  CBMZ  Current Medications: Current Outpatient Medications  Medication Sig Dispense Refill   albuterol (VENTOLIN HFA) 108 (90 Base) MCG/ACT inhaler albuterol sulfate HFA 90 mcg/actuation aerosol inhaler  INL 2 PFS PO QID     amLODipine (NORVASC) 10 MG tablet Take 10 mg by mouth daily.      ARIPiprazole (ABILIFY) 2 MG tablet Take 1 tablet (2 mg total) by mouth daily. 90 tablet 0   [START ON 12/07/2020] escitalopram (LEXAPRO) 20 MG tablet Take 1 tablet (20 mg total) by mouth daily. 90 tablet 0   LORazepam (ATIVAN) 0.5 MG tablet Take 1 tablet (0.5 mg total) by mouth daily as needed for anxiety. 30 tablet 2   metoprolol tartrate (LOPRESSOR) 25 MG tablet Take 25 mg by mouth 2 (two) times daily.     [START ON 12/07/2020] mirtazapine (REMERON) 30 MG tablet Take 1 tablet (30 mg total) by mouth at bedtime. 90 tablet 0   SYMBICORT 160-4.5 MCG/ACT inhaler Inhale 1 puff into the lungs daily.     No current facility-administered medications for this visit.     Musculoskeletal: Strength & Muscle Tone:  N/A Gait & Station:  N/A Patient leans:  N/A  Psychiatric Specialty Exam: Review of Systems  Psychiatric/Behavioral:  Negative for agitation, behavioral problems, confusion, decreased concentration, dysphoric mood, hallucinations, self-injury, sleep disturbance and suicidal ideas. The patient is nervous/anxious. The patient is not hyperactive.   All other systems reviewed and are negative.  There were no vitals taken for this visit.There is no height or weight on file to calculate BMI.  General Appearance: Fairly Groomed  Eye Contact:  Good  Speech:  Clear and Coherent  Volume:  Normal  Mood:   good  Affect:  Appropriate, Congruent, and euthymic  Thought Process:  Coherent  Orientation:  Full (Time, Place, and Person)  Thought Content: Logical   Suicidal Thoughts:  No  Homicidal Thoughts:  No  Memory:  Immediate;   Good  Judgement:  Good  Insight:  Good  Psychomotor Activity:  Normal. Resting vs postural tremors on video  Concentration:  Concentration: Good and Attention Span: Good  Recall:  Good  Fund of Knowledge: Good  Language: Good  Akathisia:  No  Handed:  Right  AIMS (if indicated): not done  Assets:  Communication Skills Desire for Improvement  ADL's:  Intact  Cognition: WNL  Sleep:  Good   Screenings: PHQ2-9    Flowsheet Row Video Visit from 09/23/2020 in [facility] Video Visit from 07/29/2020 in [facility] Office Visit from 06/07/2020 in [facility] Video Visit from 04/20/2020 in [facility]  PHQ-2 Total Score 0 0 2 6  PHQ-9 Total Score -- -- 4 11      Flowsheet Row Video Visit from 09/23/2020 in [facility] Video Visit from 07/29/2020 in [facility] Office Visit from 06/07/2020 in [facility]  C-SSRS RISK CATEGORY No Risk No Risk No Risk        Assessment and Plan:  LIBI CORSO is a 65 y.o.  year old female with a history of depression, COPD, hypertension, who presents for follow up appointment for below.   1. MDD (major depressive disorder), recurrent, in partial remission (HCC) 2. Anxiety There has been overall improvement in depressive symptoms and an anxiety since the last visit. Psychosocial stressors includes her son, who relapsed in drug use while in prison, work, grief of loss of her husband in March 2018.  She agrees to lower the dose of Abilify at this time to avoid long-term side effect.  Noted that she has some tremors on virtual exam.  Although she declined to do in person visit next time for further evaluation, she agrees to do so in the near future.  Will continue Lexapro and mirtazapine to target depression and anxiety.  Will continue lorazepam as needed for anxiety.   This clinician has discussed the side effect associated with medication prescribed during this encounter. Please refer to notes in the previous encounters for more details.     Plan I have reviewed and updated plans as below  Continue Lexapro 20 mg daily Continue mirtazapine 30 mg at night Decrease Abilify 2 mg daily monitor cogwheel rigidity (was on 10 mg) Continue lorazepam 0.5 mg as needed for anxiety- 2 refills left Next appointment: 1/19 at 1 PM for 20 mins, video.  Although she declined for in person visit next time, she agrees to do that in the future . - on melatonin 3 mg at night  - She will have annual exam in Nov. She is advised to check TSH   Past trials of medication: sertraline, fluoxetine (became emotional), bupropion,  Abilify, trazodone (drowsiness), Lunesta   The patient demonstrates the following risk factors for suicide: Chronic risk factors for suicide include: psychiatric disorder of depression, anxiety . Acute risk factors for suicide include: loss (financial, interpersonal, professional). Protective factors for this patient include: positive social support, responsibility to  others (children, family), coping skills and hope for the future. Considering these factors, the overall suicide risk at this point appears to be low. Patient is appropriate for outpatient follow up.  Faith Hotter, MD 11/25/2020, 1:56 PM

## 2020-11-25 ENCOUNTER — Encounter: Payer: Self-pay | Admitting: Psychiatry

## 2020-11-25 ENCOUNTER — Other Ambulatory Visit: Payer: Self-pay

## 2020-11-25 ENCOUNTER — Telehealth (INDEPENDENT_AMBULATORY_CARE_PROVIDER_SITE_OTHER): Payer: 59 | Admitting: Psychiatry

## 2020-11-25 DIAGNOSIS — F3341 Major depressive disorder, recurrent, in partial remission: Secondary | ICD-10-CM

## 2020-11-25 DIAGNOSIS — F419 Anxiety disorder, unspecified: Secondary | ICD-10-CM

## 2020-11-25 MED ORDER — MIRTAZAPINE 30 MG PO TABS: 30.0000 mg | ORAL_TABLET | Freq: Every day | ORAL | 0 refills | Status: DC

## 2020-11-25 MED ORDER — ARIPIPRAZOLE 2 MG PO TABS: 2.0000 mg | ORAL_TABLET | Freq: Every day | ORAL | 0 refills | Status: DC

## 2020-11-25 MED ORDER — ESCITALOPRAM OXALATE 20 MG PO TABS: 20.0000 mg | ORAL_TABLET | Freq: Every day | ORAL | 0 refills | Status: DC

## 2021-02-22 NOTE — Progress Notes (Signed)
Virtual Visit via Video Note  I connected with [name] on 02/24/21 at  1:00 PM EST by a video enabled telemedicine application and verified that I am speaking with the correct person using two identifiers.  Location: Patient: home Provider: office Persons participated in the visit- patient, provider    I discussed the limitations of evaluation and management by telemedicine and the availability of in person appointments. The patient expressed understanding and agreed to proceed.     I discussed the assessment and treatment plan with the patient. The patient was provided an opportunity to ask questions and all were answered. The patient agreed with the plan and demonstrated an understanding of the instructions.   The patient was advised to call back or seek an in-person evaluation if the symptoms worsen or if the condition fails to improve as anticipated.  I provided 12 minutes of non-face-to-face time during this encounter.   Faith Hottereina Tamicka Shimon, MD    [facility] Progress Note  02/24/2021 1:17 PM [name]  MRN:  [id number]  Chief Complaint:  Chief Complaint   Follow-up; Depression    HPI:  This is a follow-up appointment for depression and anxiety.  She states that she has been doing well except she has sinusitis.  She has been out of work due to this condition for the past week.  She is doing well at work otherwise.  She had a good holiday with her family, including her daughter-in-law, and grandchildren.  She visit her son as a surprise last week; it went well.  She thinks she has better outlook.  She goes out for dinner with her friends.  She also has a couple friends, who she goes out together.  She notices that her tremor has improved since tapering down Abilify.  She has not noticed any change in her mood.  She denies feeling depressed or anxiety.  She denies insomnia.  She has good concentration.  She denies change in appetite.  She denies SI.  She has  not taken lorazepam for the past few weeks.  She feels comfortable to stay on the medication as it is.    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 Anxiety  F41.9     2. MDD (major depressive disorder), recurrent, in partial remission (HCC)  F33.41 escitalopram (LEXAPRO) 20 MG tablet      Past Psychiatric History: Please see initial evaluation for full details. I have reviewed the history. No updates at this time.     Past Medical History:  Past Medical History:  Diagnosis Date   Anxiety    Arthritis    Complication of anesthesia    Depression    Hypertension    PONV (postoperative nausea and vomiting)     Past Surgical History:  Procedure Laterality Date   FOOT NEUROMA SURGERY     right foot   KNEE SURGERY     right arthroscopic   TOTAL KNEE ARTHROPLASTY Left 03/05/2018   Procedure: TOTAL KNEE ARTHROPLASTY;  Surgeon: Durene Romanslin, Matthew, MD;  Location: [facility];  Service: Orthopedics;  Laterality: Left;  70 minutes   TOTAL KNEE ARTHROPLASTY Right 04/09/2018   Procedure: TOTAL KNEE ARTHROPLASTY;  Surgeon: Durene Romanslin, Matthew, MD;  Location: [facility];  Service: Orthopedics;  Laterality: Right;  70 mins    Family Psychiatric History: Please see initial evaluation for full details. I have reviewed the history. No updates at this time.     Family History:  Family History  Problem Relation Age of Onset   Drug abuse Son     Social History:  Social History   Socioeconomic History   Marital status: Widowed    Spouse name: Not on file   Number of children: Not on file   Years of education: Not on file   Highest education level: Not on file  Occupational History   Not on file  Tobacco Use   Smoking status: Every Day    Packs/day: 0.25    Years: 40.00     Pack years: 10.00    Types: Cigarettes    Start date: 10/15/2017   Smokeless tobacco: Never   Tobacco comments:    smokes 2 cigarettes a day  Vaping Use   Vaping Use: Never used  Substance and Sexual Activity   Alcohol use: Yes    Comment: occasional. 11-15-2016 occa   Drug use: No    Comment: 11-15-2016 per pt no   Sexual activity: Not Currently  Other Topics Concern   Not on file  Social History Narrative   Not on file   Social Determinants of Health   Financial Resource Strain: Not on file  Food Insecurity: Not on file  Transportation Needs: Not on file  Physical Activity: Not on file  Stress: Not on file  Social Connections: Not on file    Allergies:  Allergies  Allergen Reactions   Percocet [Oxycodone-Acetaminophen] Nausea And Vomiting    Metabolic Disorder Labs: No results found for: HGBA1C, MPG No results found for: PROLACTIN No results found for: CHOL, TRIG, HDL, CHOLHDL, VLDL, LDLCALC   Therapeutic Level Labs: No results found for: LITHIUM No results found for: VALPROATE No components found for:  CBMZ  Current Medications: Current Outpatient Medications  Medication Sig Dispense Refill   LORazepam (ATIVAN) 0.5 MG tablet Take 1 tablet (0.5 mg total) by mouth daily as needed for anxiety. 30 tablet 2   albuterol (VENTOLIN HFA) 108 (90 Base) MCG/ACT inhaler albuterol sulfate HFA 90 mcg/actuation aerosol inhaler  INL 2 PFS PO QID     amLODipine (NORVASC) 10 MG tablet Take 10 mg by mouth daily.      ARIPiprazole (ABILIFY) 2 MG tablet Take 1 tablet (2 mg total) by mouth daily. 90 tablet 0   [START ON 03/07/2021] escitalopram (LEXAPRO) 20 MG tablet Take 1 tablet (20 mg total) by mouth daily. 90 tablet 1   metoprolol tartrate (LOPRESSOR) 25 MG tablet Take 25 mg by mouth 2 (two) times daily.     [START ON 03/08/2021] mirtazapine (REMERON) 30 MG tablet Take 1 tablet (30 mg total) by mouth at bedtime. 90 tablet 1   SYMBICORT 160-4.5 MCG/ACT inhaler Inhale 1 puff into  the lungs daily.     No current facility-administered medications for this visit.     Musculoskeletal: Strength & Muscle Tone:  N/A Gait & Station:  N/AA Patient leans: N/A  Psychiatric Specialty Exam: Review of Systems  Psychiatric/Behavioral:  Negative for agitation, behavioral problems, confusion, decreased concentration, dysphoric mood, hallucinations, self-injury, sleep disturbance and suicidal ideas. The patient is not nervous/anxious and is not hyperactive.   All other systems reviewed and are negative.  There were no vitals taken for this visit.There is no height or weight on file to calculate BMI.  General Appearance: Fairly  Groomed  Eye Contact:  Good  Speech:  Clear and Coherent  Volume:  Normal  Mood:   good  Affect:  Appropriate, Congruent, and calm  Thought Process:  Coherent  Orientation:  Full (Time, Place, and Person)  Thought Content: Logical   Suicidal Thoughts:  No  Homicidal Thoughts:  No  Memory:  Immediate;   Good  Judgement:  Good  Insight:  Good  Psychomotor Activity:  Normal  Concentration:  Concentration: Good and Attention Span: Good  Recall:  Good  Fund of Knowledge: Good  Language: Good  Akathisia:  No  Handed:  Right  AIMS (if indicated): not done  Assets:  Communication Skills Desire for Improvement  ADL's:  Intact  Cognition: WNL  Sleep:  Good   Screenings: PHQ2-9    Flowsheet Row Video Visit from 09/23/2020 in [facility] Video Visit from 07/29/2020 in [facility] Office Visit from 06/07/2020 in [facility] Video Visit from 04/20/2020 in [facility]  PHQ-2 Total Score 0 0 2 6  PHQ-9 Total Score -- -- 4 11      Flowsheet Row Video Visit from 09/23/2020 in [facility] Video Visit from 07/29/2020 in [facility] Office Visit from 06/07/2020 in [facility]  C-SSRS RISK CATEGORY No Risk No Risk No Risk        Assessment and Plan:  CARLICIA LEAVENS is a 66 y.o. year old female with a history of depression, COPD, hypertension, who presents for follow up appointment for below.   1. MDD (major depressive disorder), recurrent, in partial remission (HCC) 2. Anxiety There has been steady improvement in depressive symptoms and anxiety since the last visit despite tapering down Abilify due to concern of EPS. Psychosocial stressors includes her son, who relapsed in drug use while in prison, work, grief of loss of her husband in March 2018.  She has started to engage in social activity including meeting with her friends.  Will continue current dose of Lexapro and mirtazapine to target depression and anxiety.  Will continue Abilify adjunctive treatment for depression.  Noted that she reports significant improvement in EPS, denies any noticeable tremors.  Will continue lorazepam as needed for anxiety.    This clinician has discussed the side effect associated with medication prescribed during this encounter. Please refer to notes in the previous encounters for more details.    Plan  Continue Lexapro 20 mg daily Continue mirtazapine 30 mg at night Continue Abilify 2 mg daily monitor cogwheel rigidity (was on 10 mg) Continue lorazepam 0.5 mg as needed for anxiety Next appointment: 4/10 at 2 PM for 30 mins, in person.  - on melatonin 3 mg at night  - She agrees to have an annual exam. She is advised to check metabolic panels, TSH   Past trials of medication: sertraline, fluoxetine (became emotional), bupropion,  Abilify, trazodone (drowsiness), Lunesta   The patient demonstrates the following risk factors for suicide: Chronic risk factors for suicide include: psychiatric disorder of depression, anxiety . Acute risk factors for suicide include: loss (financial, interpersonal, professional). Protective factors for this patient include: positive  social support, responsibility to others (children, family), coping skills and hope for the future. Considering these factors, the overall suicide risk at this point appears to be low. Patient is appropriate for outpatient follow up.    Faith Hotter, MD 02/24/2021, 1:17 PM

## 2021-02-24 ENCOUNTER — Telehealth (INDEPENDENT_AMBULATORY_CARE_PROVIDER_SITE_OTHER): Payer: 59 | Admitting: Psychiatry

## 2021-02-24 ENCOUNTER — Encounter: Payer: Self-pay | Admitting: Psychiatry

## 2021-02-24 ENCOUNTER — Other Ambulatory Visit: Payer: Self-pay

## 2021-02-24 DIAGNOSIS — F419 Anxiety disorder, unspecified: Secondary | ICD-10-CM | POA: Diagnosis not present

## 2021-02-24 DIAGNOSIS — F3341 Major depressive disorder, recurrent, in partial remission: Secondary | ICD-10-CM | POA: Diagnosis not present

## 2021-02-24 MED ORDER — ARIPIPRAZOLE 2 MG PO TABS: 2.0000 mg | ORAL_TABLET | Freq: Every day | ORAL | 0 refills | Status: DC

## 2021-02-24 MED ORDER — MIRTAZAPINE 30 MG PO TABS: 30.0000 mg | ORAL_TABLET | Freq: Every day | ORAL | 1 refills | Status: DC

## 2021-02-24 MED ORDER — LORAZEPAM 0.5 MG PO TABS: 0.5000 mg | ORAL_TABLET | Freq: Every day | ORAL | 2 refills | Status: DC | PRN

## 2021-02-24 MED ORDER — ESCITALOPRAM OXALATE 20 MG PO TABS: 20.0000 mg | ORAL_TABLET | Freq: Every day | ORAL | 1 refills | Status: DC

## 2021-02-24 NOTE — Patient Instructions (Signed)
Continue Lexapro 20 mg daily Continue mirtazapine 30 mg at night Continue Abilify 2 mg daily monitor cogwheel rigidity  Continue lorazepam 0.5 mg as needed for anxiety Next appointment: 4/10 at 2 PM

## 2021-05-13 NOTE — Progress Notes (Signed)
? ? ?BH MD/PA/NP OP Progress Note ? ?05/16/2021 2:25 PM ?[name]  ?MRN:  [id number] ? ?Chief Complaint:  ?Chief Complaint  ?Patient presents with  ? Follow-up  ? Depression  ? ?HPI:  ?This is a follow-up appointment for depression and anxiety.  ?She states that she has been doing well.  Her mood has been better since she has been more active.  She enjoys seeing her grandchildren, who is 29 and 68 year old.  She has been busy to arrange for admission for graduation.  She has been doing good at work.  She has taken extra at times.  Her son has been doing good, and she denies any concern of relapse at this time.  She has minimal depressive symptoms as in PHQ-9.  She denies SI.  She thinks her anxiety has been better.  She takes lorazepam a few times last week for anxiety.  She denies panic attacks.  She has not noticed any tremor since morning Abilify.  She feels comfortable to stay on the current medication regimen.  She has not been able to see her PCP  due to work despite her attempt to make an appointment.   ? ? ?Wt Readings from Last 3 Encounters:  ?05/16/21 154 lb 9.6 oz (70.1 kg)  ?06/07/20 146 lb 12.8 oz (66.6 kg)  ?04/18/18 159 lb (72.1 kg)  ?  ?Visit Diagnosis:  ?  ICD-10-CM   ?1. MDD (major depressive disorder), recurrent, in partial remission (HCC)  F33.41   ?  ?2. Anxiety  F41.9   ?  ? ? ?Past Psychiatric History: Please see initial evaluation for full details. I have reviewed the history. No updates at this time.  ?  ? ?Past Medical History:  ?Past Medical History:  ?Diagnosis Date  ? Anxiety   ? Arthritis   ? Complication of anesthesia   ? Depression   ? Hypertension   ? PONV (postoperative nausea and vomiting)   ?  ?Past Surgical History:  ?Procedure Laterality Date  ? FOOT NEUROMA SURGERY    ? right foot  ? KNEE SURGERY    ? right arthroscopic  ? TOTAL KNEE ARTHROPLASTY Left 03/05/2018  ? Procedure: TOTAL KNEE ARTHROPLASTY;  Surgeon: Durene Romans, MD;  Location: [facility];  Service: Orthopedics;   Laterality: Left;  70 minutes  ? TOTAL KNEE ARTHROPLASTY Right 04/09/2018  ? Procedure: TOTAL KNEE ARTHROPLASTY;  Surgeon: Durene Romans, MD;  Location: [facility];  Service: Orthopedics;  Laterality: Right;  70 mins  ? ? ?Family Psychiatric History: Please see initial evaluation for full details. I have reviewed the history. No updates at this time.  ? ? ?Family History:  ?Family History  ?Problem Relation Age of Onset  ? Drug abuse Son   ? ? ?Social History:  ?Social History  ? ?Socioeconomic History  ? Marital status: Widowed  ?  Spouse name: Not on file  ? Number of children: Not on file  ? Years of education: Not on file  ? Highest education level: Not on file  ?Occupational History  ? Not on file  ?Tobacco Use  ? Smoking status: Every Day  ?  Packs/day: 0.25  ?  Years: 40.00  ?  Pack years: 10.00  ?  Types: Cigarettes  ?  Start date: 10/15/2017  ? Smokeless tobacco: Never  ? Tobacco comments:  ?  smokes 2 cigarettes a day  ?Vaping Use  ? Vaping Use: Never used  ?Substance and Sexual Activity  ? Alcohol use: Yes  ?  Comment: occasional. 11-15-2016 occa  ? Drug use: No  ?  Comment: 11-15-2016 per pt no  ? Sexual activity: Not Currently  ?Other Topics Concern  ? Not on file  ?Social History Narrative  ? Not on file  ? ?Social Determinants of Health  ? ?Financial Resource Strain: Not on file  ?Food Insecurity: Not on file  ?Transportation Needs: Not on file  ?Physical Activity: Not on file  ?Stress: Not on file  ?Social Connections: Not on file  ? ? ?Allergies:  ?Allergies  ?Allergen Reactions  ? Percocet [Oxycodone-Acetaminophen] Nausea And Vomiting  ? ? ?Metabolic Disorder Labs: ?No results found for: HGBA1C, MPG ?No results found for: PROLACTIN ?No results found for: CHOL, TRIG, HDL, CHOLHDL, VLDL, LDLCALC ? ? ?Therapeutic Level Labs: ?No results found for: LITHIUM ?No results found for: VALPROATE ?No components found for:  CBMZ ? ?Current Medications: ?Current Outpatient Medications  ?Medication Sig Dispense Refill   ? albuterol (VENTOLIN HFA) 108 (90 Base) MCG/ACT inhaler albuterol sulfate HFA 90 mcg/actuation aerosol inhaler ? INL 2 PFS PO QID    ? amLODipine (NORVASC) 10 MG tablet Take 10 mg by mouth daily.     ? escitalopram (LEXAPRO) 20 MG tablet Take 1 tablet (20 mg total) by mouth daily. 90 tablet 1  ? LORazepam (ATIVAN) 0.5 MG tablet Take 1 tablet (0.5 mg total) by mouth daily as needed for anxiety. 30 tablet 2  ? metoprolol tartrate (LOPRESSOR) 25 MG tablet Take 25 mg by mouth 2 (two) times daily.    ? mirtazapine (REMERON) 30 MG tablet Take 1 tablet (30 mg total) by mouth at bedtime. 90 tablet 1  ? SYMBICORT 160-4.5 MCG/ACT inhaler Inhale 1 puff into the lungs daily.    ? [START ON 05/26/2021] ARIPiprazole (ABILIFY) 2 MG tablet Take 1 tablet (2 mg total) by mouth daily. 90 tablet 1  ? ?No current facility-administered medications for this visit.  ? ? ? ?Musculoskeletal: ?Strength & Muscle Tone: within normal limits ?Gait & Station: normal ?Patient leans: N/A ? ?Psychiatric Specialty Exam: ?Review of Systems  ?Psychiatric/Behavioral:  Positive for dysphoric mood. Negative for agitation, behavioral problems, confusion, decreased concentration, hallucinations, self-injury, sleep disturbance and suicidal ideas. The patient is nervous/anxious. The patient is not hyperactive.   ?All other systems reviewed and are negative.  ?Blood pressure 123/70, pulse (!) 40, temperature 98.1 ?F (36.7 ?C), temperature source Temporal, height 5' 0.24" (1.53 m), weight 154 lb 9.6 oz (70.1 kg).Body mass index is 29.96 kg/m?.  ?General Appearance: Fairly Groomed  ?Eye Contact:  Good  ?Speech:  Clear and Coherent  ?Volume:  Normal  ?Mood:   good  ?Affect:  Appropriate, Congruent, and euthymic  ?Thought Process:  Coherent  ?Orientation:  Full (Time, Place, and Person)  ?Thought Content: Logical   ?Suicidal Thoughts:  No  ?Homicidal Thoughts:  No  ?Memory:  Immediate;   Good  ?Judgement:  Good  ?Insight:  Good  ?Psychomotor Activity:  Normal, no  rigidity, no tremors  ?Concentration:  Concentration: Good and Attention Span: Good  ?Recall:  Good  ?Fund of Knowledge: Good  ?Language: Good  ?Akathisia:  No  ?Handed:  Right  ?AIMS (if indicated): 0   ?Assets:  Communication Skills ?Desire for Improvement  ?ADL's:  Intact  ?Cognition: WNL  ?Sleep:  Good  ? ?Screenings: ?PHQ2-9   ? ?Flowsheet Row Office Visit from 05/16/2021 in [facility] Video Visit from 09/23/2020 in [facility] Video Visit from 07/29/2020 in [facility]/07/2020 [facility]  ?PHQ-2 Total Score 2 0 0 2 6  ?PHQ-9 Total Score 6 -- -- 4 11  ? ?  ? ?Flowsheet Row [facility]2020 [facility] Video [facility]2020 [facility] Office Visit from [facility]07/2020 [facility]  ?C-SSRS RISK CATEGORY No Risk No Risk No Risk  ? ?  ? ? ? ?Assessment and Plan:  ?[name] is a 66 y.o. year old female with a history of depression, COPD, hypertension, who presents for follow up appointment for below.  ? ?1. MDD (major depressive disorder), recurrent, in partial remission (HCC) ?2. Anxiety ?There has been steady improvement in depressive symptoms and anxiety since last visit. Psychosocial stressors includes her son, who relapsed in drug use while in prison, work, grief of loss of her husband in March 2018.  She has started to engage in social activity including meeting with her friends.  Will continue Lexapro and mirtazapine as maintenance treatment for depression and anxiety.  Will continue Abilify as adjunctive treatment for depression.  Will continue lorazepam as needed for anxiety.  ? ?This clinician has discussed the side effect associated with medication prescribed during this encounter.  Please refer to notes in the previous encounters for more details.   ?  ?Plan ?  ?Continue Lexapro 20 mg daily ?Continue mirtazapine 30 mg at night ?Continue Abilify 2 mg daily (used to have cogwheel rigid

## 2021-05-16 ENCOUNTER — Encounter: Payer: Self-pay | Admitting: Psychiatry

## 2021-05-16 ENCOUNTER — Ambulatory Visit (INDEPENDENT_AMBULATORY_CARE_PROVIDER_SITE_OTHER): Payer: 59 | Admitting: Psychiatry

## 2021-05-16 VITALS — BP 123/70 | HR 40 | Temp 98.1°F | Ht 60.24 in | Wt 154.6 lb

## 2021-05-16 DIAGNOSIS — F419 Anxiety disorder, unspecified: Secondary | ICD-10-CM | POA: Diagnosis not present

## 2021-05-16 DIAGNOSIS — F3341 Major depressive disorder, recurrent, in partial remission: Secondary | ICD-10-CM

## 2021-05-16 MED ORDER — ARIPIPRAZOLE 2 MG PO TABS: 2.0000 mg | ORAL_TABLET | Freq: Every day | ORAL | 1 refills | Status: DC

## 2021-05-16 NOTE — Patient Instructions (Signed)
Continue Lexapro 20 mg daily ?Continue mirtazapine 30 mg at night ?Continue Abilify 2 mg daily  ?Continue lorazepam 0.5 mg as needed for anxiety ?Next appointment: 6/26 at 8:40  ?

## 2021-06-17 ENCOUNTER — Other Ambulatory Visit: Payer: Self-pay | Admitting: Psychiatry

## 2021-06-17 NOTE — Telephone Encounter (Signed)
Ordered lorazepam.  ?I have utilized the [place] Controlled Substances Reporting System (PMP AWARxE) to confirm adherence regarding the patient's medication. My review reveals appropriate prescription fills.   ?

## 2021-07-26 NOTE — Progress Notes (Unsigned)
BH MD/PA/NP OP Progress Note  07/26/2021 8:11 AM [name]  MRN:  [id number]  Chief Complaint: No chief complaint on file.  HPI: *** Visit Diagnosis: No diagnosis found.  Past Psychiatric History: Please see initial evaluation for full details. I have reviewed the history. No updates at this time.     Past Medical History:  Past Medical History:  Diagnosis Date   Anxiety    Arthritis    Complication of anesthesia    Depression    Hypertension    PONV (postoperative nausea and vomiting)     Past Surgical History:  Procedure Laterality Date   FOOT NEUROMA SURGERY     right foot   KNEE SURGERY     right arthroscopic   TOTAL KNEE ARTHROPLASTY Left 03/05/2018   Procedure: TOTAL KNEE ARTHROPLASTY;  Surgeon: Durene Romans, MD;  Location: [facility];  Service: Orthopedics;  Laterality: Left;  70 minutes   TOTAL KNEE ARTHROPLASTY Right 04/09/2018   Procedure: TOTAL KNEE ARTHROPLASTY;  Surgeon: Durene Romans, MD;  Location: [facility];  Service: Orthopedics;  Laterality: Right;  70 mins    Family Psychiatric History: Please see initial evaluation for full details. I have reviewed the history. No updates at this time.     Family History:  Family History  Problem Relation Age of Onset   Drug abuse Son     Social History:  Social History   Socioeconomic History   Marital status: Widowed    Spouse name: Not on file   Number of children: Not on file   Years of education: Not on file   Highest education level: Not on file  Occupational History   Not on file  Tobacco Use   Smoking status: Every Day    Packs/day: 0.25    Years: 40.00    Total pack years: 10.00    Types: Cigarettes    Start date: 10/15/2017   Smokeless tobacco: Never   Tobacco comments:    smokes 2 cigarettes a day  Vaping Use   Vaping Use: Never used  Substance and Sexual Activity   Alcohol use: Yes    Comment: occasional. 11-15-2016 occa   Drug use: No    Comment: 11-15-2016 per pt no   Sexual  activity: Not Currently  Other Topics Concern   Not on file  Social History Narrative   Not on file   Social Determinants of Health   Financial Resource Strain: Not on file  Food Insecurity: Not on file  Transportation Needs: Not on file  Physical Activity: Not on file  Stress: Not on file  Social Connections: Not on file    Allergies:  Allergies  Allergen Reactions   Percocet [Oxycodone-Acetaminophen] Nausea And Vomiting    Metabolic Disorder Labs: No results found for: "HGBA1C", "MPG" No results found for: "PROLACTIN" No results found for: "CHOL", "TRIG", "HDL", "CHOLHDL", "VLDL", "LDLCALC" Lab Results  Component Value Date   TSH 1.889 ***Test methodology is 3rd generation TSH*** 07/18/2007    Therapeutic Level Labs: No results found for: "LITHIUM" No results found for: "VALPROATE" No results found for: "CBMZ"  Current Medications: Current Outpatient Medications  Medication Sig Dispense Refill   albuterol (VENTOLIN HFA) 108 (90 Base) MCG/ACT inhaler albuterol sulfate HFA 90 mcg/actuation aerosol inhaler  INL 2 PFS PO QID     amLODipine (NORVASC) 10 MG tablet Take 10 mg by mouth daily.      ARIPiprazole (ABILIFY) 2 MG tablet Take 1 tablet (2 mg total) by mouth  daily. 90 tablet 1   escitalopram (LEXAPRO) 20 MG tablet Take 1 tablet (20 mg total) by mouth daily. 90 tablet 1   LORazepam (ATIVAN) 0.5 MG tablet Take 1 tablet (0.5 mg total) by mouth daily as needed for anxiety. 30 tablet 2   metoprolol tartrate (LOPRESSOR) 25 MG tablet Take 25 mg by mouth 2 (two) times daily.     mirtazapine (REMERON) 30 MG tablet Take 1 tablet (30 mg total) by mouth at bedtime. 90 tablet 1   SYMBICORT 160-4.5 MCG/ACT inhaler Inhale 1 puff into the lungs daily.     No current facility-administered medications for this visit.     Musculoskeletal: Strength & Muscle Tone:  N/A Gait & Station:  N/A Patient leans: N/A  Psychiatric Specialty Exam: Review of Systems  There were no  vitals taken for this visit.There is no height or weight on file to calculate BMI.  General Appearance: {Appearance:22683}  Eye Contact:  {BHH EYE CONTACT:22684}  Speech:  Clear and Coherent  Volume:  Normal  Mood:  {BHH MOOD:22306}  Affect:  {Affect (PAA):22687}  Thought Process:  Coherent  Orientation:  Full (Time, Place, and Person)  Thought Content: Logical   Suicidal Thoughts:  {ST/HT (PAA):22692}  Homicidal Thoughts:  {ST/HT (PAA):22692}  Memory:  Immediate;   Good  Judgement:  {Judgement (PAA):22694}  Insight:  {Insight (PAA):22695}  Psychomotor Activity:  Normal  Concentration:  Concentration: Good and Attention Span: Good  Recall:  Good  Fund of Knowledge: Good  Language: Good  Akathisia:  No  Handed:  Right  AIMS (if indicated): not done  Assets:  Communication Skills Desire for Improvement  ADL's:  Intact  Cognition: WNL  Sleep:  {B[facility] GOOD/FAIR/POOR:22877}   Screenings: [employer]    Flowsheet Row Office Visit from 05/16/2021 in [facility] Video Visit from 09/23/2020 in [facility] Video Visit from 07/29/2020 in [facility] Office Visit from 06/07/2020 in [facility] Video Visit from 04/20/2020 in [facility]  PHQ-2 Total Score 2 0 0 2 6  PHQ-9 Total Score 6 -- -- 4 11      Flowsheet Row Video Visit from 09/23/2020 in [facility] Video Visit from 07/29/2020 in [facility] Office Visit from 06/07/2020 in [facility]  C-SSRS RISK CATEGORY No Risk No Risk No Risk        Assessment and Plan:  [name] is a 66 y.o. year old female with a history of depression, COPD, hypertension, who presents for follow up appointment for below.     1. MDD (major depressive disorder), recurrent, in partial remission (HCC) 2. Anxiety There has been  steady improvement in depressive symptoms and anxiety since last visit. Psychosocial stressors includes her son, who relapsed in drug use while in prison, work, grief of loss of her husband in March 2018.  She has started to engage in social activity including meeting with her friends.  Will continue Lexapro and mirtazapine as maintenance treatment for depression and anxiety.  Will continue Abilify as adjunctive treatment for depression.  Will continue lorazepam as needed for anxiety.    This clinician has discussed the side effect associated with medication prescribed during this encounter. Please refer to notes in the previous encounters for more details.     Plan   Continue Lexapro 20 mg daily Continue mirtazapine 30 mg at night Continue Abilify 2 mg daily (used to have cogwheel rigidity at higher  dose) Continue lorazepam 0.5 mg as needed for anxiety Next appointment: 6/26 at 8:40 for 20 mins, video - She agrees to have an annual exam. She is advised to check metabolic panels, TSH   Past trials of medication: sertraline, fluoxetine (became emotional), bupropion,  Abilify, trazodone (drowsiness), Lunesta   The patient demonstrates the following risk factors for suicide: Chronic risk factors for suicide include: psychiatric disorder of depression, anxiety . Acute risk factors for suicide include: loss (financial, interpersonal, professional). Protective factors for this patient include: positive social support, responsibility to others (children, family), coping skills and hope for the future. Considering these factors, the overall suicide risk at this point appears to be low. Patient is appropriate for outpatient follow up.          Collaboration of Care: Collaboration of Care: {BH OP Collaboration of Care:21014065}  Patient/Guardian was advised Release of Information must be obtained prior to any record release in order to collaborate their care with an outside provider. Patient/Guardian  was advised if they have not already done so to contact the registration department to sign all necessary forms in order for [place] to release information regarding their care.   Consent: Patient/Guardian gives verbal consent for treatment and assignment of benefits for services provided during this visit. Patient/Guardian expressed understanding and agreed to proceed.    Neysa Hotter, MD 07/26/2021, 8:11 AM

## 2021-08-01 ENCOUNTER — Telehealth (INDEPENDENT_AMBULATORY_CARE_PROVIDER_SITE_OTHER): Payer: 59 | Admitting: Psychiatry

## 2021-08-01 ENCOUNTER — Encounter: Payer: Self-pay | Admitting: Psychiatry

## 2021-08-01 DIAGNOSIS — F419 Anxiety disorder, unspecified: Secondary | ICD-10-CM

## 2021-08-01 DIAGNOSIS — F3341 Major depressive disorder, recurrent, in partial remission: Secondary | ICD-10-CM

## 2021-08-01 MED ORDER — MIRTAZAPINE 30 MG PO TABS: 30.0000 mg | ORAL_TABLET | Freq: Every day | ORAL | 1 refills | Status: DC

## 2021-08-01 MED ORDER — ESCITALOPRAM OXALATE 20 MG PO TABS: 20.0000 mg | ORAL_TABLET | Freq: Every day | ORAL | 1 refills | Status: DC

## 2021-10-17 NOTE — Progress Notes (Unsigned)
Virtual Visit via Video Note  I connected with [name] on 10/19/21 at 11:00 AM EDT by a video enabled telemedicine application and verified that I am speaking with the correct person using two identifiers.  Location: Patient: home Provider: office Persons participated in the visit- patient, provider    I discussed the limitations of evaluation and management by telemedicine and the availability of in person appointments. The patient expressed understanding and agreed to proceed.     I discussed the assessment and treatment plan with the patient. The patient was provided an opportunity to ask questions and all were answered. The patient agreed with the plan and demonstrated an understanding of the instructions.   The patient was advised to call back or seek an in-person evaluation if the symptoms worsen or if the condition fails to improve as anticipated.  I provided 7 minutes of non-face-to-face time during this encounter.   Neysa Hotter, MD    [facility] MD/PA/NP OP Progress Note  10/19/2021 11:33 AM [name]  MRN:  [id number]  Chief Complaint:  Chief Complaint  Patient presents with   Depression   Follow-up   HPI:  This is a follow-up appointment for depression and anxiety.  She states that she has been doing well.  She enjoyed going to [facility] with her family.  She also had a good time at her granddaughter's birthday party.  Her son is doing well.  She has been busy at work.  She has been able to manage things well.  She thinks the current combination of medication is working well.  She denies feeling depressed or anxiety.  She eats well.  She sleeps well.  She denies SI.  There were only a few times she took lorazepam for anxiety since the last visit.  She feels comfortable to stay on the current medication regimen.   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 MDD (major depressive disorder), recurrent, in partial remission (HCC)  F33.41     2. Anxiety  F41.9       Past Psychiatric History: Please see initial evaluation for full details. I have reviewed the history. No updates at this time.     Past Medical History:  Past Medical History:  Diagnosis Date   Anxiety    Arthritis    Complication of anesthesia    Depression    Hypertension    PONV (postoperative nausea and vomiting)     Past Surgical History:  Procedure Laterality Date   FOOT NEUROMA SURGERY     right foot   KNEE SURGERY     right arthroscopic   TOTAL KNEE ARTHROPLASTY Left 03/05/2018   Procedure: TOTAL KNEE ARTHROPLASTY;  Surgeon: Durene Romans, MD;  Location: [facility];  Service: Orthopedics;  Laterality: Left;  70 minutes   TOTAL KNEE ARTHROPLASTY Right 04/09/2018   Procedure: TOTAL KNEE ARTHROPLASTY;  Surgeon: Durene Romans, MD;  Location: [facility];  Service: Orthopedics;  Laterality: Right;  70 mins    Family Psychiatric History: Please see initial evaluation for full details. I have reviewed the history. No updates at this time.     Family History:  Family History  Problem Relation Age of Onset   Drug abuse Son  Social History:  Social History   Socioeconomic History   Marital status: Widowed    Spouse name: Not on file   Number of children: Not on file   Years of education: Not on file   Highest education level: Not on file  Occupational History   Not on file  Tobacco Use   Smoking status: Every Day    Packs/day: 0.25    Years: 40.00    Total pack years: 10.00    Types: Cigarettes    Start date: 10/15/2017   Smokeless tobacco: Never   Tobacco comments:    smokes 2 cigarettes a day  Vaping Use   Vaping Use: Never used  Substance and Sexual Activity   Alcohol use: Yes    Comment: occasional. 11-15-2016  occa   Drug use: No    Comment: 11-15-2016 per pt no   Sexual activity: Not Currently  Other Topics Concern   Not on file  Social History Narrative   Not on file   Social Determinants of Health   Financial Resource Strain: Not on file  Food Insecurity: Not on file  Transportation Needs: Not on file  Physical Activity: Not on file  Stress: Not on file  Social Connections: Not on file    Allergies:  Allergies  Allergen Reactions   Percocet [Oxycodone-Acetaminophen] Nausea And Vomiting    Metabolic Disorder Labs: No results found for: "HGBA1C", "MPG" No results found for: "PROLACTIN" No results found for: "CHOL", "TRIG", "HDL", "CHOLHDL", "VLDL", "LDLCALC"   Therapeutic Level Labs: No results found for: "LITHIUM" No results found for: "VALPROATE" No results found for: "CBMZ"  Current Medications: Current Outpatient Medications  Medication Sig Dispense Refill   albuterol (VENTOLIN HFA) 108 (90 Base) MCG/ACT inhaler albuterol sulfate HFA 90 mcg/actuation aerosol inhaler  INL 2 PFS PO QID     amLODipine (NORVASC) 10 MG tablet Take 10 mg by mouth daily.      [START ON 11/23/2021] ARIPiprazole (ABILIFY) 2 MG tablet Take 1 tablet (2 mg total) by mouth daily. 90 tablet 1   escitalopram (LEXAPRO) 20 MG tablet Take 1 tablet (20 mg total) by mouth daily. 90 tablet 1   metoprolol tartrate (LOPRESSOR) 25 MG tablet Take 25 mg by mouth 2 (two) times daily.     mirtazapine (REMERON) 30 MG tablet Take 1 tablet (30 mg total) by mouth at bedtime. 90 tablet 1   SYMBICORT 160-4.5 MCG/ACT inhaler Inhale 1 puff into the lungs daily.     No current facility-administered medications for this visit.     Musculoskeletal: Strength & Muscle Tone:  N/A Gait & Station:  N/A Patient leans: N/A  Psychiatric Specialty Exam: Review of Systems  Psychiatric/Behavioral: Negative.    All other systems reviewed and are negative.   There were no vitals taken for this visit.There is no height or  weight on file to calculate BMI.  General Appearance: Fairly Groomed  Eye Contact:  Good  Speech:  Clear and Coherent  Volume:  Normal  Mood:   good  Affect:  Appropriate, Congruent, and Full Range  Thought Process:  Coherent  Orientation:  Full (Time, Place, and Person)  Thought Content: Logical   Suicidal Thoughts:  No  Homicidal Thoughts:  No  Memory:  Immediate;   Good  Judgement:  Good  Insight:  Good  Psychomotor Activity:  Normal  Concentration:  Concentration: Good and Attention Span: Good  Recall:  Good  Fund of Knowledge: Good  Language: Good  Akathisia:  No  Handed:  Right  AIMS (if indicated): not done  Assets:  Communication Skills Desire for Improvement  ADL's:  Intact  Cognition: WNL  Sleep:  Good   Screenings: PHQ2-9    Flowsheet Row Office Visit from 05/16/2021 in [facility] Video Visit from 09/23/2020 in [facility] Video Visit from 07/29/2020 in [facility] Office Visit from 06/07/2020 in [facility] Video Visit from 04/20/2020 in [facility]  PHQ-2 Total Score 2 0 0 2 6  PHQ-9 Total Score 6 -- -- 4 11      Flowsheet Row Video Visit from 09/23/2020 in [facility] Video Visit from 07/29/2020 in [facility] Office Visit from 06/07/2020 in [facility]  C-SSRS RISK CATEGORY No Risk No Risk No Risk        Assessment and Plan:  [name] is a 66 y.o. year old female with a history of depression, COPD, hypertension, who presents for follow up appointment for below.   1. MDD (major depressive disorder), recurrent, in partial remission (HCC) 2. Anxiety There has been steady improvement in depressive symptoms and anxiety since the last visit. Psychosocial stressors includes her son, who relapsed in drug use while in prison, work,  grief of loss of her husband in March 2018.  She has started to engage in social activity including meeting with her friends.  Will continue Lexapro and mirtazapine as maintenance treatment for depression and anxiety.  Will continue Abilify as adjunctive treatment for depression.  Will continue lorazepam as needed for anxiety.      Plan   Continue Lexapro 20 mg daily Continue mirtazapine 30 mg at night Continue Abilify 2 mg daily (used to have cogwheel rigidity at higher dose) Continue lorazepam 0.5 mg as needed for anxiety- she rarely takes this medication Next appointment: 12/6 at 9 AM, video - She will be seen by PCP in several days   Past trials of medication: sertraline, fluoxetine (became emotional), bupropion,  Abilify, trazodone (drowsiness), Lunesta   The patient demonstrates the following risk factors for suicide: Chronic risk factors for suicide include: psychiatric disorder of depression, anxiety . Acute risk factors for suicide include: loss (financial, interpersonal, professional). Protective factors for this patient include: positive social support, responsibility to others (children, family), coping skills and hope for the future. Considering these factors, the overall suicide risk at this point appears to be low. Patient is appropriate for outpatient follow up.          Collaboration of Care: Collaboration of Care: Other N/A  Patient/Guardian was advised Release of Information must be obtained prior to any record release in order to collaborate their care with an outside provider. Patient/Guardian was advised if they have not already done so to contact the registration department to sign all necessary forms in order for [place] to release information regarding their care.   Consent: Patient/Guardian gives verbal consent for treatment and assignment of benefits for services provided during this visit. Patient/Guardian expressed understanding and agreed to proceed.    Neysa Hotter, MD 10/19/2021, 11:33 AM N/A

## 2021-10-19 ENCOUNTER — Encounter: Payer: Self-pay | Admitting: Psychiatry

## 2021-10-19 ENCOUNTER — Telehealth: Payer: 59 | Admitting: Psychiatry

## 2021-10-19 DIAGNOSIS — F3341 Major depressive disorder, recurrent, in partial remission: Secondary | ICD-10-CM

## 2021-10-19 DIAGNOSIS — F419 Anxiety disorder, unspecified: Secondary | ICD-10-CM

## 2021-10-19 MED ORDER — ARIPIPRAZOLE 2 MG PO TABS: 2.0000 mg | ORAL_TABLET | Freq: Every day | ORAL | 1 refills | Status: DC

## 2022-01-09 NOTE — Progress Notes (Unsigned)
Virtual Visit via Video Note  I connected with [name] on 01/11/22 at  9:00 AM EST by a video enabled telemedicine application and verified that I am speaking with the correct person using two identifiers.  Location: Patient: home Provider: office Persons participated in the visit- patient, provider    I discussed the limitations of evaluation and management by telemedicine and the availability of in person appointments. The patient expressed understanding and agreed to proceed.     I discussed the assessment and treatment plan with the patient. The patient was provided an opportunity to ask questions and all were answered. The patient agreed with the plan and demonstrated an understanding of the instructions.   The patient was advised to call back or seek an in-person evaluation if the symptoms worsen or if the condition fails to improve as anticipated.  I provided 15 minutes of non-face-to-face time during this encounter.   Neysa Hotter, MD    [facility] MD/PA/NP OP Progress Note  01/11/2022 9:23 AM [name]  MRN:  [id number]  Chief Complaint:  Chief Complaint  Patient presents with   Depression   HPI:  This is a follow-up appointment for depression and anxiety.  She states that she has been doing well.  She had a good Thanksgiving at her friend's place.  Her son has been doing good.  She is doing good at work.  Her niece, age 2 keeps her busy.  She also takes care of her granddaughter, who she meets every week.  She turned 66-year-old.  She has been more active lately.  She sleeps well.  She denies feeling depressed.  She denies change in appetite.  She denies SI.  She occasionally feels anxious, and has taken Ativan 4 times since the last visit.  She denies alcohol use or drug use.   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 MDD (major depressive disorder), recurrent, in partial remission (HCC)  F33.41     2. Anxiety  F41.9     3. Cigarette nicotine dependence without complication  F17.210       Past Psychiatric History: Please see initial evaluation for full details. I have reviewed the history. No updates at this time.     Past Medical History:  Past Medical History:  Diagnosis Date   Anxiety    Arthritis    Complication of anesthesia    Depression    Hypertension    PONV (postoperative nausea and vomiting)     Past Surgical History:  Procedure Laterality Date   FOOT NEUROMA SURGERY     right foot   KNEE SURGERY     right arthroscopic   TOTAL KNEE ARTHROPLASTY Left 03/05/2018   Procedure: TOTAL KNEE ARTHROPLASTY;  Surgeon: Durene Romans, MD;  Location: [facility];  Service: Orthopedics;  Laterality: Left;  70 minutes   TOTAL KNEE ARTHROPLASTY Right 04/09/2018   Procedure: TOTAL KNEE ARTHROPLASTY;  Surgeon: Durene Romans, MD;  Location: [facility];  Service: Orthopedics;  Laterality: Right;  70 mins    Family Psychiatric History: Please see initial evaluation for full details. I have reviewed the history. No updates at this time.     Family History:  Family History  Problem Relation Age of Onset  Drug abuse Son     Social History:  Social History   Socioeconomic History   Marital status: Widowed    Spouse name: Not on file   Number of children: Not on file   Years of education: Not on file   Highest education level: Not on file  Occupational History   Not on file  Tobacco Use   Smoking status: Every Day    Packs/day: 0.25    Years: 40.00    Total pack years: 10.00    Types: Cigarettes    Start date: 10/15/2017   Smokeless tobacco: Never   Tobacco comments:    smokes 2 cigarettes a day  Vaping Use   Vaping Use: Never used  Substance and Sexual Activity   Alcohol use: Yes    Comment:  occasional. 11-15-2016 occa   Drug use: No    Comment: 11-15-2016 per pt no   Sexual activity: Not Currently  Other Topics Concern   Not on file  Social History Narrative   Not on file   Social Determinants of Health   Financial Resource Strain: Not on file  Food Insecurity: Not on file  Transportation Needs: Not on file  Physical Activity: Not on file  Stress: Not on file  Social Connections: Not on file    Allergies:  Allergies  Allergen Reactions   Percocet [Oxycodone-Acetaminophen] Nausea And Vomiting    Metabolic Disorder Labs: No results found for: "HGBA1C", "MPG" No results found for: "PROLACTIN" No results found for: "CHOL", "TRIG", "HDL", "CHOLHDL", "VLDL", "LDLCALC"   Therapeutic Level Labs: No results found for: "LITHIUM" No results found for: "VALPROATE" No results found for: "CBMZ"  Current Medications: Current Outpatient Medications  Medication Sig Dispense Refill   albuterol (VENTOLIN HFA) 108 (90 Base) MCG/ACT inhaler albuterol sulfate HFA 90 mcg/actuation aerosol inhaler  INL 2 PFS PO QID     amLODipine (NORVASC) 10 MG tablet Take 10 mg by mouth daily.      ARIPiprazole (ABILIFY) 2 MG tablet Take 1 tablet (2 mg total) by mouth daily. 90 tablet 1   escitalopram (LEXAPRO) 20 MG tablet Take 1 tablet (20 mg total) by mouth daily. 90 tablet 1   metoprolol tartrate (LOPRESSOR) 25 MG tablet Take 25 mg by mouth 2 (two) times daily.     mirtazapine (REMERON) 30 MG tablet Take 1 tablet (30 mg total) by mouth at bedtime. 90 tablet 1   SYMBICORT 160-4.5 MCG/ACT inhaler Inhale 1 puff into the lungs daily.     No current facility-administered medications for this visit.     Musculoskeletal: Strength & Muscle Tone:  N/A Gait & Station:  N/A Patient leans: N/A  Psychiatric Specialty Exam: Review of Systems  Psychiatric/Behavioral:  Negative for agitation, behavioral problems, confusion, decreased concentration, dysphoric mood, hallucinations, self-injury,  sleep disturbance and suicidal ideas. The patient is nervous/anxious. The patient is not hyperactive.   All other systems reviewed and are negative.   There were no vitals taken for this visit.There is no height or weight on file to calculate BMI.  General Appearance: Fairly Groomed  Eye Contact:  Good  Speech:  Clear and Coherent  Volume:  Normal  Mood:   good  Affect:  Appropriate, Congruent, and Full Range  Thought Process:  Coherent  Orientation:  Full (Time, Place, and Person)  Thought Content: Logical   Suicidal Thoughts:  No  Homicidal Thoughts:  No  Memory:  Immediate;   Good  Judgement:  Good  Insight:  Good  Psychomotor Activity:  Normal  Concentration:  Concentration: Good and Attention Span: Good  Recall:  Good  Fund of Knowledge: Good  Language: Good  Akathisia:  No  Handed:  Right  AIMS (if indicated): not done  Assets:  Communication Skills Desire for Improvement  ADL's:  Intact  Cognition: WNL  Sleep:  Good   Screenings: PHQ2-9    Flowsheet Row Office Visit from 05/16/2021 in [facility] Video Visit from 09/23/2020 in [facility] Video Visit from 07/29/2020 in [facility] Office Visit from 06/07/2020 in [facility] Video Visit from 04/20/2020 in [facility]  PHQ-2 Total Score 2 0 0 2 6  PHQ-9 Total Score 6 -- -- 4 11      Flowsheet Row Video Visit from 09/23/2020 in [facility] Video Visit from 07/29/2020 in [facility] Office Visit from 06/07/2020 in [facility]  C-SSRS RISK CATEGORY No Risk No Risk No Risk        Assessment and Plan:  BREINDY MEADOW is a 66 y.o. year old female with a history of depression, COPD, hypertension, who presents for follow up appointment for below.    1. MDD (major depressive disorder), recurrent,  in partial remission (HCC) 2. Anxiety There has been steady improvement in depressive symptoms and anxiety since the last visit. Psychosocial stressors includes her son, who relapsed in drug use while in prison, work, grief of loss of her husband in March 2018.  She has started to engage in social activity including meeting with her friends.  Will continue current dose of Lexapro, mirtazapine as maintenance treatment for depression and anxiety.  Will continue Abilify as adjunctive treatment for depression.  Will continue lorazepam as needed for anxiety.   3. Cigarette nicotine dependence without complication She smokes 0.5 PPD.  She has been smoking since 66 year old, and is not interested in quitting.  Provided psychoeducation regarding its impact on her physical health, and pharmacological treatment if she is interested.   Plan  Continue Lexapro 20 mg daily Continue mirtazapine 30 mg at night Continue Abilify 2 mg daily (used to have cogwheel rigidity at higher dose) Continue lorazepam 0.5 mg as needed for anxiety- she takes this up to once a week Next appointment: 2/28 at 1 PM, video - She will be seen by PCP in January. She agrees to obtain EKG to monitor QTc prolongation   Past trials of medication: sertraline, fluoxetine (became emotional), bupropion,  Abilify, trazodone (drowsiness), Lunesta   The patient demonstrates the following risk factors for suicide: Chronic risk factors for suicide include: psychiatric disorder of depression, anxiety . Acute risk factors for suicide include: loss (financial, interpersonal, professional). Protective factors for this patient include: positive social support, responsibility to others (children, family), coping skills and hope for the future. Considering these factors, the overall suicide risk at this point appears to be low. Patient is appropriate for outpatient follow up.       I have utilized the [place] Controlled Substances Reporting System (PMP  AWARxE) to confirm adherence regarding the patient's medication. My review reveals appropriate prescription fills.   This clinician has discussed the side effect associated with medication prescribed during this encounter. Please refer to notes in the previous encounters for more details.      Collaboration of Care: Collaboration of Care: Other reviewed notes in Epic  Patient/Guardian was advised Release of Information must be obtained prior to any record release  in order to collaborate their care with an outside provider. Patient/Guardian was advised if they have not already done so to contact the registration department to sign all necessary forms in order for [place] to release information regarding their care.   Consent: Patient/Guardian gives verbal consent for treatment and assignment of benefits for services provided during this visit. Patient/Guardian expressed understanding and agreed to proceed.    Neysa Hotter, MD 01/11/2022, 9:23 AM

## 2022-01-11 ENCOUNTER — Telehealth (INDEPENDENT_AMBULATORY_CARE_PROVIDER_SITE_OTHER): Payer: 59 | Admitting: Psychiatry

## 2022-01-11 ENCOUNTER — Encounter: Payer: Self-pay | Admitting: Psychiatry

## 2022-01-11 DIAGNOSIS — F419 Anxiety disorder, unspecified: Secondary | ICD-10-CM

## 2022-01-11 DIAGNOSIS — F3341 Major depressive disorder, recurrent, in partial remission: Secondary | ICD-10-CM | POA: Diagnosis not present

## 2022-01-11 DIAGNOSIS — F17210 Nicotine dependence, cigarettes, uncomplicated: Secondary | ICD-10-CM

## 2022-01-11 NOTE — Patient Instructions (Signed)
Continue Lexapro 20 mg daily Continue mirtazapine 30 mg at night Continue Abilify 2 mg daily  Continue lorazepam 0.5 mg as needed for anxiety- she takes this up to once a week Next appointment: 2/28 at 1 PM

## 2022-01-19 ENCOUNTER — Telehealth: Payer: Self-pay

## 2022-01-19 ENCOUNTER — Other Ambulatory Visit: Payer: Self-pay | Admitting: Psychiatry

## 2022-01-19 MED ORDER — LORAZEPAM 0.5 MG PO TABS: 0.5000 mg | ORAL_TABLET | Freq: Every day | ORAL | 2 refills | Status: DC | PRN

## 2022-01-19 NOTE — Telephone Encounter (Signed)
received fax requesting a refill on the lorazepam .5mg 

## 2022-01-19 NOTE — Telephone Encounter (Signed)
Ordered.   I have utilized the [place] Controlled Substances Reporting System (PMP AWARxE) to confirm adherence regarding the patient's medication. My review reveals appropriate prescription fills.

## 2022-02-05 ENCOUNTER — Other Ambulatory Visit: Payer: Self-pay | Admitting: Psychiatry

## 2022-03-06 ENCOUNTER — Telehealth: Payer: Self-pay

## 2022-03-06 ENCOUNTER — Other Ambulatory Visit: Payer: Self-pay | Admitting: Psychiatry

## 2022-03-06 DIAGNOSIS — F3341 Major depressive disorder, recurrent, in partial remission: Secondary | ICD-10-CM

## 2022-03-06 MED ORDER — ESCITALOPRAM OXALATE 20 MG PO TABS: 20.0000 mg | ORAL_TABLET | Freq: Every day | ORAL | 1 refills | Status: DC

## 2022-03-06 NOTE — Telephone Encounter (Signed)
Ordered

## 2022-03-06 NOTE — Telephone Encounter (Signed)
Received fax requesting a refill on the escitalopram 20mg  pt last seen on 01-11-22 and next appt 04-05-22    escitalopram (LEXAPRO) 20 MG tablet Medication Expired Date: 08/01/2021 Department: [facility] Ordering/Authorizing: Norman Clay, MD   Order Providers  Prescribing Provider Encounter Provider  Norman Clay, MD Norman Clay, MD   Outpatient Medication Detail   Disp Refills Start End   escitalopram (LEXAPRO) 20 MG tablet 90 tablet 1 09/04/2021 03/03/2022   Sig - Route: Take 1 tablet (20 mg total) by mouth daily. - Oral   Sent to pharmacy as: escitalopram (LEXAPRO) 20 MG tablet   Notes to Pharmacy: Fill after  7/23   E-Prescribing Status: Receipt confirmed by pharmacy (08/01/2021  8:58 AM EDT)

## 2022-03-31 NOTE — Progress Notes (Unsigned)
Virtual Visit via Video Note  I connected with [name] on 04/05/22 at  1:00 PM EST by a video enabled telemedicine application and verified that I am speaking with the correct person using two identifiers.  Location: Patient: car Provider: office Persons participated in the visit- patient, provider    I discussed the limitations of evaluation and management by telemedicine and the availability of in person appointments. The patient expressed understanding and agreed to proceed.    I discussed the assessment and treatment plan with the patient. The patient was provided an opportunity to ask questions and all were answered. The patient agreed with the plan and demonstrated an understanding of the instructions.   The patient was advised to call back or seek an in-person evaluation if the symptoms worsen or if the condition fails to improve as anticipated.  I provided 15 minutes of non-face-to-face time during this encounter.   Norman Clay, MD    [facility] MD/PA/NP OP Progress Note  04/05/2022 1:25 PM [name]  MRN:  [id number]  Chief Complaint:  Chief Complaint  Patient presents with   Depression   HPI:  This is a follow-up appointment for depression, anxiety.  She states that she has been doing very well.  She had a good Christmas.  Her niece and her daughter keeps her busy.  She is concerned about her daughter, who is struggling with back pain secondary to degenerative disc.  She has been also busy at work.  She works 6 days a week.  She has been able to handle things well.  She talks with her son in jail, who is doing well.  She denies feeling depressed.  She has a fair sleep.  She feels good about weight loss since being active.  She denies SI.  She denies panic attacks.  She states that she takes lorazepam once a week.  However, she feels the medication as she feels better having the medication.  She agrees to cut down the amount this time.  She denies alcohol use  or drug use.  She asks if she needs to be on her current medication forever.  She was advised to first try to taper off lorazepam, and may consider tapering off Abilify if her mood stays good.  She denies any side effect from the medication at this time, and she agrees with the plans.   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 MDD (major depressive disorder), recurrent, in partial remission ([place])  F33.41     2. Anxiety  F41.9       Past Psychiatric History: Please see initial evaluation for full details. I have reviewed the history. No updates at this time.     Past Medical History:  Past Medical History:  Diagnosis Date   Anxiety    Arthritis    Complication of anesthesia    Depression    Hypertension    PONV (postoperative nausea and vomiting)     Past Surgical History:  Procedure Laterality Date   FOOT  NEUROMA SURGERY     right foot   KNEE SURGERY     right arthroscopic   TOTAL KNEE ARTHROPLASTY Left 03/05/2018   Procedure: TOTAL KNEE ARTHROPLASTY;  Surgeon: Paralee Cancel, MD;  Location: [facility];  Service: Orthopedics;  Laterality: Left;  70 minutes   TOTAL KNEE ARTHROPLASTY Right 04/09/2018   Procedure: TOTAL KNEE ARTHROPLASTY;  Surgeon: Paralee Cancel, MD;  Location: [facility];  Service: Orthopedics;  Laterality: Right;  70 mins    Family Psychiatric History: Please see initial evaluation for full details. I have reviewed the history. No updates at this time.     Family History:  Family History  Problem Relation Age of Onset   Drug abuse Son     Social  History:  Social History   Socioeconomic History   Marital status: Widowed    Spouse name: Not on file   Number of children: Not on file   Years of education: Not on file   Highest education level: Not on file  Occupational History   Not on file  Tobacco Use   Smoking status: Every Day    Packs/day: 0.25    Years: 40.00    Total pack years: 10.00    Types: Cigarettes    Start date: 10/15/2017   Smokeless tobacco: Never   Tobacco comments:    smokes 2 cigarettes a day  Vaping Use   Vaping Use: Never used  Substance and Sexual Activity   Alcohol use: Yes    Comment: occasional. 11-15-2016 occa   Drug use: No    Comment: 11-15-2016 per pt no   Sexual activity: Not Currently  Other Topics Concern   Not on file  Social History Narrative   Not on file   Social Determinants of Health   Financial Resource Strain: Not on file  Food Insecurity: Not on file  Transportation Needs: Not on file  Physical Activity: Not on file  Stress: Not on file  Social Connections: Not on file    Allergies:  Allergies  Allergen Reactions   Percocet [Oxycodone-Acetaminophen] Nausea And Vomiting    Metabolic Disorder Labs: No results found for: "HGBA1C", "MPG" No results found for: "PROLACTIN" No results found for: "CHOL", "TRIG", "HDL", "CHOLHDL", "VLDL", "LDLCALC"   Therapeutic Level Labs: No results found for: "LITHIUM" No results found for: "VALPROATE" No results found for: "CBMZ"  Current Medications: Current Outpatient Medications  Medication Sig Dispense Refill   albuterol (VENTOLIN HFA) 108 (90 Base) MCG/ACT inhaler albuterol sulfate HFA 90 mcg/actuation aerosol inhaler  INL 2 PFS PO QID     amLODipine (NORVASC) 10 MG tablet Take 10 mg by mouth daily.      [START ON 05/22/2022] ARIPiprazole (ABILIFY) 2 MG tablet Take 1 tablet (2 mg total) by mouth daily. 90 tablet 0   escitalopram (LEXAPRO) 20 MG tablet Take 1 tablet (20 mg total) by mouth daily. 90 tablet 1   [START ON  05/02/2022] LORazepam (ATIVAN) 0.5 MG tablet Take 1 tablet (0.5 mg total) by mouth every other day as needed for anxiety. 15 tablet 1   metoprolol tartrate (LOPRESSOR) 25 MG tablet Take 25 mg by mouth 2 (two) times daily.     mirtazapine (REMERON) 30 MG tablet Take 1 tablet (30 mg total) by mouth at bedtime. 90 tablet 1   SYMBICORT 160-4.5 MCG/ACT inhaler Inhale 1 puff into the lungs daily.     No current facility-administered medications for this visit.     Musculoskeletal: Strength & Muscle Tone:  N/A Gait & Station:  N/A Patient leans: N/A  Psychiatric Specialty Exam: Review of Systems  Psychiatric/Behavioral:  Positive for sleep disturbance. Negative for agitation, behavioral problems, confusion, decreased concentration, dysphoric mood, hallucinations, self-injury and suicidal ideas. The patient is nervous/anxious. The patient is not hyperactive.   All other systems reviewed and are negative.   There were no vitals taken for this visit.There is no height or weight on file to calculate BMI.  General Appearance: Fairly Groomed  Eye Contact:  Good  Speech:  Clear and Coherent  Volume:  Normal  Mood:   good  Affect:  Appropriate, Congruent, and calm  Thought Process:  Coherent  Orientation:  Full (Time, Place, and Person)  Thought Content: Logical   Suicidal Thoughts:  No  Homicidal Thoughts:  No  Memory:  Immediate;   Good  Judgement:  Good  Insight:  Good  Psychomotor Activity:  Normal  Concentration:  Concentration: Good and Attention Span: Good  Recall:  Good  Fund of Knowledge: Good  Language: Good  Akathisia:  No  Handed:  Right  AIMS (if indicated): not done  Assets:  Communication Skills Desire for Improvement  ADL's:  Intact  Cognition: WNL  Sleep:  Good   Screenings: PHQ2-9    [place] Office Visit from 05/16/2021 in [place] Video Visit from 09/23/2020 in [place]  Video Visit from 07/29/2020 in [place] Office Visit from 06/07/2020 in [place] Video Visit from 04/20/2020 in [employer]  PHQ-2 Total Score 2 0 0 2 6  PHQ-9 Total Score 6 -- -- 4 11      Flowsheet Row Video Visit from 09/23/2020 in [place] Video Visit from 07/29/2020 in [place] Office Visit from 06/07/2020 in [place] No Risk No Risk No Risk        Assessment and Plan:  [name] is a 67 y.o. year old female with a history of depression, COPD, hypertension, who presents for follow up appointment for below.   1. MDD (major depressive disorder), recurrent, in partial remission ([place]) 2. Anxiety Acute stressors include: her daughter with back issues  Other stressors include: son in prison, who abused drug, loss of her husband in March 2018   History:   There has been steady improvement in depressive symptoms and anxiety since the last visit.  She enjoys seeing her daughter, and her niece.  Will continue current dose of Lexapro, mirtazapine to target depression and anxiety.  Will continue Abilify as adjunctive treatment for depression.  Will continue lorazepam as needed for anxiety.  She agrees to limit the amount of lorazepam prescription.  The hope is to taper off this medication eventually.     3. Cigarette nicotine dependence without complication She smokes 0.5 PPD.  She has been smoking since 67 year old, and is not interested in quitting.  Provided psychoeducation regarding its impact on her physical health, and pharmacological treatment if she is interested.    Plan  Continue Lexapro 20 mg daily Continue mirtazapine 30 mg at night- she has meds per patient Continue Abilify 2 mg daily (used to have  cogwheel rigidity at higher dose) Continue lorazepam 0.5 mg as needed for anxiety- reduced it to 15 tabs per month (she states that she takes it weekly) Next appointment: 5/21  at 4 pm, IP She was advised to obtain EKG at her next PCP visit in March if that is not done last year.   Past trials of medication: sertraline, fluoxetine (became emotional), bupropion,  Abilify, trazodone (drowsiness), Lunesta   The patient demonstrates the following risk factors for suicide: Chronic risk factors for suicide include: psychiatric disorder of depression, anxiety . Acute risk factors for suicide include: loss (financial, interpersonal, professional). Protective factors for this patient include: positive social support, responsibility to others (children, family), coping skills and hope for the future. Considering these factors, the overall suicide risk at this point appears to be low. Patient is appropriate for outpatient follow up.      Collaboration of Care: Collaboration of Care: Other reviewed notes in Epic  Patient/Guardian was advised Release of Information must be obtained prior to any record release in order to collaborate their care with an outside provider. Patient/Guardian was advised if they have not already done so to contact the registration department to sign all necessary forms in order for [place] to release information regarding their care.   Consent: Patient/Guardian gives verbal consent for treatment and assignment of benefits for services provided during this visit. Patient/Guardian expressed understanding and agreed to proceed.    Norman Clay, MD 04/05/2022, 1:25 PM

## 2022-04-05 ENCOUNTER — Encounter: Payer: Self-pay | Admitting: Psychiatry

## 2022-04-05 ENCOUNTER — Telehealth (INDEPENDENT_AMBULATORY_CARE_PROVIDER_SITE_OTHER): Payer: 59 | Admitting: Psychiatry

## 2022-04-05 DIAGNOSIS — F3341 Major depressive disorder, recurrent, in partial remission: Secondary | ICD-10-CM

## 2022-04-05 DIAGNOSIS — F419 Anxiety disorder, unspecified: Secondary | ICD-10-CM

## 2022-04-05 MED ORDER — LORAZEPAM 0.5 MG PO TABS: 0.5000 mg | ORAL_TABLET | ORAL | 1 refills | Status: AC | PRN

## 2022-04-05 MED ORDER — ARIPIPRAZOLE 2 MG PO TABS: 2.0000 mg | ORAL_TABLET | Freq: Every day | ORAL | 0 refills | Status: DC

## 2022-04-14 ENCOUNTER — Other Ambulatory Visit: Payer: Self-pay | Admitting: Psychiatry

## 2022-06-24 NOTE — Progress Notes (Deleted)
[facility] MD/PA/NP OP Progress Note  06/24/2022 4:58 PM [name]  MRN:  [id number]  Chief Complaint: No chief complaint on file.  HPI: *** Visit Diagnosis: No diagnosis found.  Past Psychiatric History: Please see initial evaluation for full details. I have reviewed the history. No updates at this time.     Past Medical History:  Past Medical History:  Diagnosis Date   Anxiety    Arthritis    Complication of anesthesia    Depression    Hypertension    PONV (postoperative nausea and vomiting)     Past Surgical History:  Procedure Laterality Date   FOOT NEUROMA SURGERY     right foot   KNEE SURGERY     right arthroscopic   TOTAL KNEE ARTHROPLASTY Left 03/05/2018   Procedure: TOTAL KNEE ARTHROPLASTY;  Surgeon: Durene Romans, MD;  Location: [facility];  Service: Orthopedics;  Laterality: Left;  70 minutes   TOTAL KNEE ARTHROPLASTY Right 04/09/2018   Procedure: TOTAL KNEE ARTHROPLASTY;  Surgeon: Durene Romans, MD;  Location: [facility];  Service: Orthopedics;  Laterality: Right;  70 mins    Family Psychiatric History: Please see initial evaluation for full details. I have reviewed the history. No updates at this time.     Family History:  Family History  Problem Relation Age of Onset   Drug abuse Son     Social History:  Social History   Socioeconomic History   Marital status: Widowed    Spouse name: Not on file   Number of children: Not on file   Years of education: Not on file   Highest education level: Not on file  Occupational History   Not on file  Tobacco Use   Smoking status: Every Day    Packs/day: 0.25    Years: 40.00    Additional pack years: 0.00    Total pack years: 10.00    Types: Cigarettes    Start date: 10/15/2017   Smokeless tobacco: Never   Tobacco comments:    smokes 2 cigarettes a day  Vaping Use   Vaping Use: Never used  Substance and Sexual Activity   Alcohol use: Yes    Comment: occasional. 11-15-2016 occa   Drug use: No    Comment:  11-15-2016 per pt no   Sexual activity: Not Currently  Other Topics Concern   Not on file  Social History Narrative   Not on file   Social Determinants of Health   Financial Resource Strain: Not on file  Food Insecurity: Not on file  Transportation Needs: Not on file  Physical Activity: Not on file  Stress: Not on file  Social Connections: Not on file    Allergies:  Allergies  Allergen Reactions   Percocet [Oxycodone-Acetaminophen] Nausea And Vomiting    Metabolic Disorder Labs: No results found for: "HGBA1C", "MPG" No results found for: "PROLACTIN" No results found for: "CHOL", "TRIG", "HDL", "CHOLHDL", "VLDL", "LDLCALC" Lab Results  Component Value Date   TSH 1.889 ***Test methodology is 3rd generation TSH*** 07/18/2007    Therapeutic Level Labs: No results found for: "LITHIUM" No results found for: "VALPROATE" No results found for: "CBMZ"  Current Medications: Current Outpatient Medications  Medication Sig Dispense Refill   albuterol (VENTOLIN HFA) 108 (90 Base) MCG/ACT inhaler albuterol sulfate HFA 90 mcg/actuation aerosol inhaler  INL 2 PFS PO QID     amLODipine (NORVASC) 10 MG tablet Take 10 mg by mouth daily.      ARIPiprazole (ABILIFY) 2 MG tablet Take  1 tablet (2 mg total) by mouth daily. 90 tablet 0   escitalopram (LEXAPRO) 20 MG tablet Take 1 tablet (20 mg total) by mouth daily. 90 tablet 1   LORazepam (ATIVAN) 0.5 MG tablet Take 1 tablet (0.5 mg total) by mouth every other day as needed for anxiety. 15 tablet 1   metoprolol tartrate (LOPRESSOR) 25 MG tablet Take 25 mg by mouth 2 (two) times daily.     mirtazapine (REMERON) 30 MG tablet TAKE 1 TABLET(30 MG) BY MOUTH AT BEDTIME 90 tablet 0   SYMBICORT 160-4.5 MCG/ACT inhaler Inhale 1 puff into the lungs daily.     No current facility-administered medications for this visit.     Musculoskeletal: Strength & Muscle Tone: within normal limits Gait & Station: normal Patient leans: N/A  Psychiatric  Specialty Exam: Review of Systems  There were no vitals taken for this visit.There is no height or weight on file to calculate BMI.  General Appearance: {Appearance:22683}  Eye Contact:  {BHH EYE CONTACT:22684}  Speech:  Clear and Coherent  Volume:  Normal  Mood:  {BHH MOOD:22306}  Affect:  {Affect (PAA):22687}  Thought Process:  Coherent  Orientation:  Full (Time, Place, and Person)  Thought Content: Logical   Suicidal Thoughts:  {ST/HT (PAA):22692}  Homicidal Thoughts:  {ST/HT (PAA):22692}  Memory:  Immediate;   Good  Judgement:  {Judgement (PAA):22694}  Insight:  {Insight (PAA):22695}  Psychomotor Activity:  Normal  Concentration:  Concentration: Good and Attention Span: Good  Recall:  Good  Fund of Knowledge: Good  Language: Good  Akathisia:  No  Handed:  Right  AIMS (if indicated): not done  Assets:  Communication Skills Desire for Improvement  ADL's:  Intact  Cognition: WNL  Sleep:  {BHH GOOD/FAIR/POOR:22877}   Screenings: [employer] [facility] Office Visit from 05/16/2021 in [facility] Video Visit from 09/23/2020 in [facility] Video Visit from 07/29/2020 in [facility] Office Visit from 06/07/2020 in [facility] Video Visit from 04/20/2020 in [facility]  PHQ-2 Total Score 2 0 0 2 6  PHQ-9 Total Score 6 -- -- 4 11      Flowsheet Row Video Visit from 09/23/2020 in [facility] Video Visit from 07/29/2020 in [facility] Office Visit from 06/07/2020 in [facility]  C-SSRS RISK CATEGORY No Risk No Risk No Risk        Assessment and Plan:  [name] is a 67 y.o. year old female with a history of depression, COPD, hypertension, who presents  for follow up appointment for below.    1. MDD (major depressive disorder), recurrent, in partial remission ([facility]) 2. Anxiety Acute stressors include: her daughter with back issues  Other stressors include: son in prison, who abused drug, loss of her husband in March 2018   History:   There has been steady improvement in depressive symptoms and anxiety since the last visit.  She enjoys seeing her daughter, and her niece.  Will continue current dose of Lexapro, mirtazapine to target depression and anxiety.  Will continue Abilify as adjunctive treatment for depression.  Will continue lorazepam as needed for anxiety.  She agrees to limit the amount of lorazepam prescription.  The hope is to taper off this medication eventually.      3. Cigarette nicotine dependence without complication She smokes  0.5 PPD.  She has been smoking since 67 year old, and is not interested in quitting.  Provided psychoeducation regarding its impact on her physical health, and pharmacological treatment if she is interested.    Plan  Continue Lexapro 20 mg daily Continue mirtazapine 30 mg at night- she has meds per patient Continue Abilify 2 mg daily (used to have cogwheel rigidity at higher dose) Continue lorazepam 0.5 mg as needed for anxiety- reduced it to 15 tabs per month (she states that she takes it weekly) Next appointment: 5/21 at 4 pm, IP She was advised to obtain EKG at her next PCP visit in March if that is not done last year.   Past trials of medication: sertraline, fluoxetine (became emotional), bupropion,  Abilify, trazodone (drowsiness), Lunesta   The patient demonstrates the following risk factors for suicide: Chronic risk factors for suicide include: psychiatric disorder of depression, anxiety . Acute risk factors for suicide include: loss (financial, interpersonal, professional). Protective factors for this patient include: positive social support, responsibility to others (children, family), coping  skills and hope for the future. Considering these factors, the overall suicide risk at this point appears to be low. Patient is appropriate for outpatient follow up.    Collaboration of Care: Collaboration of Care: {BH OP Collaboration of Care:21014065}  Patient/Guardian was advised Release of Information must be obtained prior to any record release in order to collaborate their care with an outside provider. Patient/Guardian was advised if they have not already done so to contact the registration department to sign all necessary forms in order for [place] to release information regarding their care.   Consent: Patient/Guardian gives verbal consent for treatment and assignment of benefits for services provided during this visit. Patient/Guardian expressed understanding and agreed to proceed.    Neysa Hotter, MD 06/24/2022, 4:58 PM

## 2022-06-27 ENCOUNTER — Ambulatory Visit: Payer: 59 | Admitting: Psychiatry

## 2022-07-17 ENCOUNTER — Other Ambulatory Visit: Payer: Self-pay | Admitting: Psychiatry

## 2022-07-18 NOTE — Progress Notes (Signed)
[facility] MD/PA/NP OP Progress Note  07/24/2022 12:03 PM [name]  MRN:  [id number]  Chief Complaint:  Chief Complaint  Patient presents with   Follow-up   HPI:  This is a follow-up appointment for depression and anxiety.  She states that she has been doing very well.  She sees her grandchildren 4 times a week, and enjoys interaction.  Her roommate has left a few weeks ago.  It did not work well, and she feels much better after her leave.  She feels happy here.  She denies any issues at work.  Only the concern is that she has lip smacking for the past month.  She is willing to discontinue Abilify while monitoring her mood symptoms.  She sleeps well.  She denies feeling depressed.  She denies SI.  She has weight gain, which she attributes to eating ice cream.  She is planning to work on diet.  She is doing well with taking clonazepam less frequently.  She would like to stay on the current amount while she continues to work on tapering off this medication.  She denies alcohol use or drug .   Wt Readings from Last 3 Encounters:  07/24/22 163 lb (73.9 kg)  05/16/21 154 lb 9.6 oz (70.1 kg)  06/07/20 146 lb 12.8 oz (66.6 kg)   Visit Diagnosis:    ICD-10-CM   1. Recurrent major depressive disorder, in full remission (HCC)  F33.42     2. Anxiety  F41.9       Past Psychiatric History: Please see initial evaluation for full details. I have reviewed the history. No updates at this time.     Past Medical History:  Past Medical History:  Diagnosis Date   Anxiety    Arthritis    Complication of anesthesia    Depression    Hypertension    PONV (postoperative nausea and vomiting)     Past Surgical History:  Procedure Laterality Date   FOOT NEUROMA SURGERY     right foot   KNEE SURGERY     right arthroscopic   TOTAL KNEE ARTHROPLASTY Left 03/05/2018   Procedure: TOTAL KNEE ARTHROPLASTY;  Surgeon: Durene Romans, MD;  Location: [facility];  Service: Orthopedics;  Laterality: Left;  70  minutes   TOTAL KNEE ARTHROPLASTY Right 04/09/2018   Procedure: TOTAL KNEE ARTHROPLASTY;  Surgeon: Durene Romans, MD;  Location: [facility];  Service: Orthopedics;  Laterality: Right;  70 mins    Family Psychiatric History: Please see initial evaluation for full details. I have reviewed the history. No updates at this time.     Family History:  Family History  Problem Relation Age of Onset   Drug abuse Son     Social History:  Social History   Socioeconomic History   Marital status: Widowed    Spouse name: Not on file   Number of children: Not on file   Years of education: Not on file   Highest education level: Not on file  Occupational History   Not on file  Tobacco Use   Smoking status: Every Day    Packs/day: 0.25    Years: 40.00    Additional pack years: 0.00    Total pack years: 10.00    Types: Cigarettes    Start date: 10/15/2017   Smokeless tobacco: Never   Tobacco comments:    smokes 2 cigarettes a day  Vaping Use   Vaping Use: Never used  Substance and Sexual Activity   Alcohol use: Yes  Comment: occasional. 11-15-2016 occa   Drug use: No    Comment: 11-15-2016 per pt no   Sexual activity: Not Currently  Other Topics Concern   Not on file  Social History Narrative   Not on file   Social Determinants of Health   Financial Resource Strain: Not on file  Food Insecurity: Not on file  Transportation Needs: Not on file  Physical Activity: Not on file  Stress: Not on file  Social Connections: Not on file    Allergies:  Allergies  Allergen Reactions   Percocet [Oxycodone-Acetaminophen] Nausea And Vomiting    Metabolic Disorder Labs: No results found for: "HGBA1C", "MPG" No results found for: "PROLACTIN" No results found for: "CHOL", "TRIG", "HDL", "CHOLHDL", "VLDL", "LDLCALC"  Therapeutic Level Labs: No results found for: "LITHIUM" No results found for: "VALPROATE" No results found for: "CBMZ"  Current Medications: Current Outpatient Medications   Medication Sig Dispense Refill   albuterol (VENTOLIN HFA) 108 (90 Base) MCG/ACT inhaler albuterol sulfate HFA 90 mcg/actuation aerosol inhaler  INL 2 PFS PO QID     amLODipine (NORVASC) 10 MG tablet Take 10 mg by mouth daily.      escitalopram (LEXAPRO) 20 MG tablet Take 1 tablet (20 mg total) by mouth daily. 90 tablet 1   [START ON 07/27/2022] LORazepam (ATIVAN) 0.5 MG tablet Take 1 tablet (0.5 mg total) by mouth every other day as needed for anxiety. 15 tablet 1   metoprolol tartrate (LOPRESSOR) 25 MG tablet Take 25 mg by mouth 2 (two) times daily.     mirtazapine (REMERON) 30 MG tablet Take 1 tablet (30 mg total) by mouth at bedtime. 90 tablet 1   SYMBICORT 160-4.5 MCG/ACT inhaler Inhale 1 puff into the lungs daily.     No current facility-administered medications for this visit.     Musculoskeletal: Strength & Muscle Tone: within normal limits Gait & Station: normal Patient leans: N/A  Psychiatric Specialty Exam: Review of Systems  Psychiatric/Behavioral: Negative.    All other systems reviewed and are negative.   Blood pressure 125/74, pulse 78, temperature 98.5 F (36.9 C), temperature source Skin, height 5' 0.24" (1.53 m), weight 163 lb (73.9 kg).Body mass index is 31.58 kg/m.  General Appearance: Fairly Groomed  Eye Contact:  Good  Speech:  Clear and Coherent  Volume:  Normal  Mood:   very good  Affect:  Appropriate, Congruent, and Full Range  Thought Process:  Coherent  Orientation:  Full (Time, Place, and Person)  Thought Content: Logical   Suicidal Thoughts:  No  Homicidal Thoughts:  No  Memory:  Immediate;   Good  Judgement:  Good  Insight:  Good  Psychomotor Activity:  Normal tone, no rigidity, no resting/postural tremors, +lip smacking  Concentration:  Concentration: Good and Attention Span: Good  Recall:  Good  Fund of Knowledge: Good  Language: Good  Akathisia:  No  Handed:  Right  AIMS (if indicated): not done  Assets:  Communication Skills Desire  for Improvement  ADL's:  Intact  Cognition: WNL  Sleep:  Good   Screenings: PHQ2-9    Flowsheet Row Office Visit from 07/24/2022 in [facility] Office Visit from 05/16/2021 in [facility] Video Visit from 09/23/2020 in [facility] Video Visit from 07/29/2020 in [facility] Office Visit from 06/07/2020 in [facility]  PHQ-2 Total Score 0 2 0 0 2  PHQ-9 Total Score --  6 -- -- 4      Flowsheet Row Video Visit from 09/23/2020 in [facility] Video Visit from 07/29/2020 in [facility] Office Visit from 06/07/2020 in [facility]  C-SSRS RISK CATEGORY No Risk No Risk No Risk        Assessment and Plan:  [name] is a 67 y.o. year old female with a history of depression, COPD, hypertension, who presents for follow up appointment for below.   1. Recurrent major depressive disorder, in full remission (HCC) 2. Anxiety Acute stressors include: her daughter with back issues  Other stressors include: son in prison, who abused drug, loss of her husband in March 2018   History:   There has been a steady improvement in depressive symptoms and anxiety since the last visit.  Will discuss the new Abilify at this time due to adverse reaction of lip smacking.  Will continue current dose of Lexapro and mirtazapine to target depression and anxiety.  Although will continue to prescribe the same amount of lorazepam, she is willing to taper off this medication.    3. Cigarette nicotine dependence without complication She smokes 0.5 PPD.  She has been smoking since 67 year old, and is not interested in quitting.  Provided psychoeducation regarding its impact on her physical health, and  pharmacological treatment if she is interested.    Plan  Continue Lexapro 20 mg daily Continue mirtazapine 30 mg at night- she has meds per patient Discontinue Abilify (lip smacking) Continue lorazepam 0.5 mg as needed for anxiety- 15 tabs per month since April 2024 Next appointment: 8/21 at 1:40, video    Past trials of medication: sertraline, fluoxetine (became emotional), bupropion,  Abilify, trazodone (drowsiness), Lunesta   The patient demonstrates the following risk factors for suicide: Chronic risk factors for suicide include: psychiatric disorder of depression, anxiety . Acute risk factors for suicide include: loss (financial, interpersonal, professional). Protective factors for this patient include: positive social support, responsibility to others (children, family), coping skills and hope for the future. Considering these factors, the overall suicide risk at this point appears to be low. Patient is appropriate for outpatient follow up.    Collaboration of Care: Collaboration of Care: Other reviewed notes in Epic  Patient/Guardian was advised Release of Information must be obtained prior to any record release in order to collaborate their care with an outside provider. Patient/Guardian was advised if they have not already done so to contact the registration department to sign all necessary forms in order for [place] to release information regarding their care.   Consent: Patient/Guardian gives verbal consent for treatment and assignment of benefits for services provided during this visit. Patient/Guardian expressed understanding and agreed to proceed.    Neysa Hotter, MD 07/24/2022, 12:03 PM

## 2022-07-24 ENCOUNTER — Ambulatory Visit (INDEPENDENT_AMBULATORY_CARE_PROVIDER_SITE_OTHER): Payer: 59 | Admitting: Psychiatry

## 2022-07-24 ENCOUNTER — Encounter: Payer: Self-pay | Admitting: Psychiatry

## 2022-07-24 VITALS — BP 125/74 | HR 78 | Temp 98.5°F | Ht 60.24 in | Wt 163.0 lb

## 2022-07-24 DIAGNOSIS — F419 Anxiety disorder, unspecified: Secondary | ICD-10-CM

## 2022-07-24 DIAGNOSIS — F3342 Major depressive disorder, recurrent, in full remission: Secondary | ICD-10-CM | POA: Diagnosis not present

## 2022-07-24 MED ORDER — LORAZEPAM 0.5 MG PO TABS: 0.5000 mg | ORAL_TABLET | ORAL | 1 refills | Status: AC | PRN

## 2022-09-06 ENCOUNTER — Other Ambulatory Visit: Payer: Self-pay | Admitting: Psychiatry

## 2022-09-06 DIAGNOSIS — F3341 Major depressive disorder, recurrent, in partial remission: Secondary | ICD-10-CM

## 2022-09-24 NOTE — Progress Notes (Unsigned)
Virtual Visit via Video Note  I connected with [name] on 09/27/22 at  8:40 AM EDT by a video enabled telemedicine application and verified that I am speaking with the correct person using two identifiers.  Location: Patient: home Provider: office Persons participated in the visit- patient, provider    I discussed the limitations of evaluation and management by telemedicine and the availability of in person appointments. The patient expressed understanding and agreed to proceed.    I discussed the assessment and treatment plan with the patient. The patient was provided an opportunity to ask questions and all were answered. The patient agreed with the plan and demonstrated an understanding of the instructions.   The patient was advised to call back or seek an in-person evaluation if the symptoms worsen or if the condition fails to improve as anticipated.  I provided 12 minutes of non-face-to-face time during this encounter.   Neysa Hotter, MD    [facility] MD/PA/NP OP Progress Note  09/27/2022 9:09 AM [name]  MRN:  [id number]  Chief Complaint:  Chief Complaint  Patient presents with   Follow-up   HPI:  This is a follow-up appointment for depression and anxiety.  She apologized for being late for the appointment due to her sleeping over.  She states that she has been doing great.  The work has been going great.  She is covering for the other, and enjoys working on displays.  She likes using her mind.  Her son is doing well.  He works in [employer] in prison.  Her daughter will be visiting her today, and she is looking forward to it.  She has not noticed any difference since discontinuation of Abilify.  She also has not noticed any change in lip smacking.  She feels comfortable to stay off Abilify.  She took lorazepam,  4 times in the past 2 weeks for anxiety.  She denies panic attacks.  She agrees to stay off from this medication as much as possible.  She sleeps  well.  She denies feeling depressed.  She denies change in appetite.  She denies SI.  She denies alcohol use or drug use.    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 Recurrent major depressive disorder, in full remission (HCC)  F33.42     2. Anxiety  F41.9     3. Tardive dyskinesia  G24.01       Past Psychiatric History: Please see initial evaluation for full details. I have reviewed the history. No updates at this time.     Past Medical History:  Past Medical History:  Diagnosis Date   Anxiety    Arthritis    Complication of anesthesia    Depression    Hypertension    PONV (postoperative nausea and vomiting)     Past Surgical History:  Procedure Laterality Date   FOOT NEUROMA SURGERY     right foot   KNEE SURGERY     right arthroscopic   TOTAL KNEE ARTHROPLASTY Left 03/05/2018  Procedure: TOTAL KNEE ARTHROPLASTY;  Surgeon: Durene Romans, MD;  Location: [facility];  Service: Orthopedics;  Laterality: Left;  70 minutes   TOTAL KNEE ARTHROPLASTY Right 04/09/2018   Procedure: TOTAL KNEE ARTHROPLASTY;  Surgeon: Durene Romans, MD;  Location: [facility];  Service: Orthopedics;  Laterality: Right;  70 mins    Family Psychiatric History: Please see initial evaluation for full details. I have reviewed the history. No updates at this time.     Family History:  Family History  Problem Relation Age of Onset   Drug abuse Son     Social History:  Social History   Socioeconomic History   Marital status: Widowed    Spouse name: Not on file   Number of children: Not on file    Years of education: Not on file   Highest education level: Not on file  Occupational History   Not on file  Tobacco Use   Smoking status: Every Day    Current packs/day: 0.25    Average packs/day: 0.2 packs/day for 40.0 years (10.0 ttl pk-yrs)    Types: Cigarettes    Start date: 10/15/2017   Smokeless tobacco: Never   Tobacco comments:    smokes 2 cigarettes a day  Vaping Use   Vaping status: Never Used  Substance and Sexual Activity   Alcohol use: Yes    Comment: occasional. 11-15-2016 occa   Drug use: No    Comment: 11-15-2016 per pt no   Sexual activity: Not Currently  Other Topics Concern   Not on file  Social History Narrative   Not on file   Social Determinants of Health   Financial Resource Strain: Not on file  Food Insecurity: Not on file  Transportation Needs: Not on file  Physical Activity: Not on file  Stress: Not on file  Social Connections: Not on file    Allergies:  Allergies  Allergen Reactions   Percocet [Oxycodone-Acetaminophen] Nausea And Vomiting    Metabolic Disorder Labs: No results found for: "HGBA1C", "MPG" No results found for: "PROLACTIN" No results found for: "CHOL", "TRIG", "HDL", "CHOLHDL", "VLDL", "LDLCALC"  Therapeutic Level Labs: No results found for: "LITHIUM" No results found for: "VALPROATE" No results found for: "CBMZ"  Current Medications: Current Outpatient Medications  Medication Sig Dispense Refill   LORazepam (ATIVAN) 0.5 MG tablet Take 0.5 mg by mouth every other day as needed for anxiety. 15 tab per month     albuterol (VENTOLIN HFA) 108 (90 Base) MCG/ACT inhaler albuterol sulfate HFA 90 mcg/actuation aerosol inhaler  INL 2 PFS PO QID     amLODipine (NORVASC) 10 MG tablet Take 10 mg by mouth daily.      escitalopram (LEXAPRO) 20 MG tablet TAKE 1 TABLET(20 MG) BY MOUTH DAILY 90 tablet 1   metoprolol tartrate (LOPRESSOR) 25 MG tablet Take 25 mg by mouth 2 (two) times daily.     mirtazapine (REMERON) 30 MG tablet  Take 1 tablet (30 mg total) by mouth at bedtime. 90 tablet 1   SYMBICORT 160-4.5 MCG/ACT inhaler Inhale 1 puff into the lungs daily.     No current facility-administered medications for this visit.     Musculoskeletal: Strength & Muscle Tone:  N/A Gait & Station:  N/A Patient leans: N/A  Psychiatric Specialty Exam: Review of Systems  Psychiatric/Behavioral:  Negative for agitation, behavioral problems, confusion, decreased concentration, dysphoric mood, hallucinations, self-injury, sleep disturbance and suicidal ideas. The patient is not nervous/anxious and is not hyperactive.   All other systems reviewed and are  negative.   There were no vitals taken for this visit.There is no height or weight on file to calculate BMI.  General Appearance: Fairly Groomed  Eye Contact:  Good  Speech:  Clear and Coherent  Volume:  Normal  Mood:   good  Affect:  Appropriate, Congruent, and calm  Thought Process:  Coherent  Orientation:  Full (Time, Place, and Person)  Thought Content: Logical   Suicidal Thoughts:  No  Homicidal Thoughts:  No  Memory:  Immediate;   Good  Judgement:  Good  Insight:  Good  Psychomotor Activity:   oral dyskinesia  Concentration:  Concentration: Good and Attention Span: Good  Recall:  Good  Fund of Knowledge: Good  Language: Good  Akathisia:  No  Handed:  Right  AIMS (if indicated): not done  Assets:  Communication Skills Desire for Improvement  ADL's:  Intact  Cognition: WNL  Sleep:  Good   Screenings: PHQ2-9    Flowsheet Row Office Visit from 07/24/2022 in [facility] Office Visit from 05/16/2021 in [facility] Video Visit from 09/23/2020 in [facility] Video Visit from 07/29/2020 in [facility] Office Visit from 06/07/2020 in [facility]  PHQ-2 Total Score 0  2 0 0 2  PHQ-9 Total Score -- 6 -- -- 4      Flowsheet Row Video Visit from 09/23/2020 in [facility] Video Visit from 07/29/2020 in [facility] Office Visit from 06/07/2020 in [facility]  C-SSRS RISK CATEGORY No Risk No Risk No Risk        Assessment and Plan:  MORAIMA ARBOGAST is a 67 y.o. year old female with a history of depression, COPD, hypertension, who presents for follow up appointment for below.   1. Recurrent major depressive disorder, in full remission (HCC) 2. Anxiety Acute stressors include: her daughter with back issues  Other stressors include: son in prison, who abused drug, loss of her husband in March 2018   History:   There has been steady improvement in depressive symptoms and anxiety despite discontinuation of Abilify due to adverse reaction.  Will continue current dose of Lexapro and mirtazapine to target depression and anxiety.  She is wanting to taper off lorazepam.  3. Tardive dyskinesia Exam is notable for lip smacking, which is likely secondary to Abilify use.  The medication was discontinued.  Will continue to hold this medication to avoid any side effect.  Noted that although it was discussed with the patient regarding pharmacological treatment, she is not interested in this at this time.    3. Cigarette nicotine dependence without complication She smokes 0.5 PPD.  She has been smoking since 67 year old, and is not interested in quitting.  Provided psychoeducation regarding its impact on her physical health, and pharmacological treatment if she is interested.    Plan  Continue Lexapro 20 mg daily Continue mirtazapine 30 mg at night- she has meds per patient Continue lorazepam 0.5 mg as needed for anxiety- 15 tabs per month since April 2024 Next appointment: 10/30 at 1 20, video     Past trials of medication: sertraline, fluoxetine  (became emotional), bupropion,  Abilify, trazodone (drowsiness), Lunesta   The patient demonstrates the following risk factors for suicide: Chronic risk factors for suicide include: psychiatric disorder of depression, anxiety . Acute risk factors for suicide include: loss (  financial, interpersonal, professional). Protective factors for this patient include: positive social support, responsibility to others (children, family), coping skills and hope for the future. Considering these factors, the overall suicide risk at this point appears to be low. Patient is appropriate for outpatient follow up.    Collaboration of Care: Collaboration of Care: Other reviewed notes in Epic  Patient/Guardian was advised Release of Information must be obtained prior to any record release in order to collaborate their care with an outside provider. Patient/Guardian was advised if they have not already done so to contact the registration department to sign all necessary forms in order for [place] to release information regarding their care.   Consent: Patient/Guardian gives verbal consent for treatment and assignment of benefits for services provided during this visit. Patient/Guardian expressed understanding and agreed to proceed.    Neysa Hotter, MD 09/27/2022, 9:09 AM

## 2022-09-27 ENCOUNTER — Encounter: Payer: Self-pay | Admitting: Psychiatry

## 2022-09-27 ENCOUNTER — Telehealth (INDEPENDENT_AMBULATORY_CARE_PROVIDER_SITE_OTHER): Payer: 59 | Admitting: Psychiatry

## 2022-09-27 DIAGNOSIS — G2401 Drug induced subacute dyskinesia: Secondary | ICD-10-CM

## 2022-09-27 DIAGNOSIS — F3342 Major depressive disorder, recurrent, in full remission: Secondary | ICD-10-CM | POA: Diagnosis not present

## 2022-09-27 DIAGNOSIS — F419 Anxiety disorder, unspecified: Secondary | ICD-10-CM

## 2022-09-27 DIAGNOSIS — F17210 Nicotine dependence, cigarettes, uncomplicated: Secondary | ICD-10-CM | POA: Diagnosis not present

## 2022-09-27 NOTE — Patient Instructions (Signed)
Continue Lexapro 20 mg daily Continue mirtazapine 30 mg at night Continue lorazepam 0.5 mg as needed for anxiety- 15 tabs per month  Next appointment: 10/30 at 1 20
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
## 2022-12-06 ENCOUNTER — Encounter: Payer: Self-pay | Admitting: Psychiatry

## 2022-12-06 ENCOUNTER — Telehealth: Payer: 59 | Admitting: Psychiatry

## 2022-12-06 DIAGNOSIS — G2401 Drug induced subacute dyskinesia: Secondary | ICD-10-CM | POA: Diagnosis not present

## 2022-12-06 DIAGNOSIS — F3342 Major depressive disorder, recurrent, in full remission: Secondary | ICD-10-CM

## 2022-12-06 DIAGNOSIS — F419 Anxiety disorder, unspecified: Secondary | ICD-10-CM

## 2022-12-06 MED ORDER — LORAZEPAM 0.5 MG PO TABS: 0.5000 mg | ORAL_TABLET | ORAL | 0 refills | Status: AC | PRN

## 2022-12-06 MED ORDER — MIRTAZAPINE 30 MG PO TABS: 30.0000 mg | ORAL_TABLET | Freq: Every day | ORAL | 1 refills | Status: DC

## 2022-12-11 ENCOUNTER — Other Ambulatory Visit: Payer: Self-pay | Admitting: Psychiatry

## 2022-12-11 DIAGNOSIS — F3341 Major depressive disorder, recurrent, in partial remission: Secondary | ICD-10-CM

## 2023-02-08 ENCOUNTER — Telehealth: Payer: 59 | Admitting: Psychiatry

## 2023-02-08 ENCOUNTER — Telehealth: Payer: Self-pay

## 2023-02-08 ENCOUNTER — Encounter: Payer: Self-pay | Admitting: Psychiatry

## 2023-02-08 DIAGNOSIS — F410 Panic disorder [episodic paroxysmal anxiety] without agoraphobia: Secondary | ICD-10-CM

## 2023-02-08 DIAGNOSIS — G2401 Drug induced subacute dyskinesia: Secondary | ICD-10-CM | POA: Diagnosis not present

## 2023-02-08 DIAGNOSIS — F3342 Major depressive disorder, recurrent, in full remission: Secondary | ICD-10-CM | POA: Diagnosis not present

## 2023-02-08 MED ORDER — LORAZEPAM 0.5 MG PO TABS: 0.5000 mg | ORAL_TABLET | Freq: Every day | ORAL | 1 refills | Status: AC | PRN

## 2023-02-08 MED ORDER — MIRTAZAPINE 45 MG PO TABS: 45.0000 mg | ORAL_TABLET | Freq: Every day | ORAL | 1 refills | Status: DC

## 2023-02-08 NOTE — Patient Instructions (Signed)
 Continue Lexapro 20 mg daily Increase mirtazapine 45 mg at night Restart lorazepam 0.5 mg as needed for anxiety Next appointment: 2/12 at 10 am

## 2023-02-08 NOTE — Telephone Encounter (Signed)
 pt left a message that she has had 8 panic attacks in the last 2 1/2 - 3 weeks. can you please send in something.  pt was last seen on 10-30 no follow up appt set up. i have sent a message with front desk to call and set up an appt for her to see you.

## 2023-02-08 NOTE — Telephone Encounter (Signed)
 She was seen today, and the issues are addressed.
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
## 2023-03-17 NOTE — Progress Notes (Signed)
Virtual Visit via Video Note  I connected with [name] on 03/21/23 at 10:00 AM EST by a video enabled telemedicine application and verified that I am speaking with the correct person using two identifiers.  Location: Patient: home Provider: office Persons participated in the visit- patient, provider    I discussed the limitations of evaluation and management by telemedicine and the availability of in person appointments. The patient expressed understanding and agreed to proceed.    I discussed the assessment and treatment plan with the patient. The patient was provided an opportunity to ask questions and all were answered. The patient agreed with the plan and demonstrated an understanding of the instructions.   The patient was advised to call back or seek an in-person evaluation if the symptoms worsen or if the condition fails to improve as anticipated.    Neysa Hotter, MD    [facility] MD/PA/NP OP Progress Note  03/21/2023 10:36 AM [name]  MRN:  [id number]  Chief Complaint:  Chief Complaint  Patient presents with   Follow-up   HPI:  - since the last visit, she presented to ED for worsening in SBO in the setting of hypoxemia, multifocal pneumonia, new onset systolic CHF.   This is a follow-up appointment for depression, anxiety.  She states that she is not doing well.  She was admitted to ICU.  She feels a lot better since discharge.  However, she is very stressed due to financial strain.  She is out of work at least till April.  She reports good support from her daughter, grandson.  Her niece gets on her nerves as she needs to be busy.  She spends time watching TV as she was advised to get rest.  Although she used to enjoy crafts, she is unable to do those anymore due to pain secondary to arthritis.  However, she is willing to find out anything as she will go to [place] in several days.  She has initial and middle insomnia, being worried about finances.  She feels  depressed and has anhedonia.  She denies SI.  She had a few panic attacks since the last visit.  She is unsure how higher dose of mirtazapine makes any difference, although she may feel a little better.  She takes lorazepam every day for anxiety.  She agrees with the plan as outlined below.   140 lbs Wt Readings from Last 3 Encounters:  07/24/22 163 lb (73.9 kg)  05/16/21 154 lb 9.6 oz (70.1 kg)  06/07/20 146 lb 12.8 oz (66.6 kg)      Visit Diagnosis:    ICD-10-CM   1. Mild episode of recurrent major depressive disorder (HCC)  F33.0     2. Panic attack  F41.0     3. Tardive dyskinesia  G24.01       Past Psychiatric History: Please see initial evaluation for full details. I have reviewed the history. No updates at this time.     Past Medical History:  Past Medical History:  Diagnosis Date   Anxiety    Arthritis    Complication of anesthesia    Depression    Hypertension    PONV (postoperative nausea and vomiting)     Past Surgical History:  Procedure Laterality Date   FOOT NEUROMA SURGERY     right foot   KNEE SURGERY     right arthroscopic   TOTAL KNEE ARTHROPLASTY Left 03/05/2018   Procedure: TOTAL KNEE ARTHROPLASTY;  Surgeon: Durene Romans, MD;  Location: [facility];  Service: Orthopedics;  Laterality: Left;  70 minutes   TOTAL KNEE ARTHROPLASTY Right 04/09/2018   Procedure: TOTAL KNEE ARTHROPLASTY;  Surgeon: Durene Romans, MD;  Location: [facility];  Service: Orthopedics;  Laterality: Right;  70 mins    Family Psychiatric History: Please see initial evaluation for full details. I have reviewed the history. No updates at this time.     Family History:  Family History  Problem Relation Age of Onset   Drug abuse Son     Social History:  Social History   Socioeconomic History   Marital status: Widowed    Spouse name: Not on file   Number of children: Not on file   Years of education: Not on file   Highest education level: Not on file  Occupational History   Not on  file  Tobacco Use   Smoking status: Every Day    Current packs/day: 0.25    Average packs/day: 0.3 packs/day for 40.0 years (10.0 ttl pk-yrs)    Types: Cigarettes    Start date: 10/15/2017   Smokeless tobacco: Never   Tobacco comments:    smokes 2 cigarettes a day  Vaping Use   Vaping status: Never Used  Substance and Sexual Activity   Alcohol use: Yes    Comment: occasional. 11-15-2016 occa   Drug use: No    Comment: 11-15-2016 per pt no   Sexual activity: Not Currently  Other Topics Concern   Not on file  Social History Narrative   Not on file   Social Drivers of Health   Financial Resource Strain: Low Risk  (02/26/2023)   Received from [facility]   Overall Financial Resource Strain (CARDIA)    Difficulty of Paying Living Expenses: Not hard at all  Food Insecurity: No Food Insecurity (02/26/2023)   Received from [facility]   Hunger Vital Sign    Worried About Running Out of Food in the Last Year: Never true    Ran Out of Food in the Last Year: Never true  Transportation Needs: No Transportation Needs (02/26/2023)   Received from [facility] - Transportation    Lack of Transportation (Medical): No    Lack of Transportation (Non-Medical): No  Physical Activity: Patient Unable To Answer (02/25/2023)   Received from [facility]   Exercise Vital Sign    Days of Exercise per Week: Patient unable to answer    Minutes of Exercise per Session: Patient unable to answer  Stress: Stress Concern Present (02/25/2023)   Received from [facility] - Occupational Stress Questionnaire    Feeling of Stress : To some extent  Social Connections: Moderately Integrated (02/25/2023)   Received from [facility]   Social Connection and Isolation Panel [NHANES]    Frequency of Communication with Friends and Family: More than three times a week    Frequency of Social Gatherings with Friends and Family: Three times a week     Attends Religious Services: 1 to 4 times per year    Active Member of Clubs or Organizations: Yes    Attends Banker Meetings: 1 to 4 times per year    Marital Status: Widowed    Allergies:  Allergies  Allergen Reactions   Percocet [Oxycodone-Acetaminophen] Nausea And Vomiting       Therapeutic Level Labs: No results found for: "LITHIUM" No results found for: "VALPROATE" No results found for: "CBMZ"  Current Medications: Current Outpatient Medications  Medication Sig Dispense Refill   albuterol (VENTOLIN HFA) 108 (90 Base) MCG/ACT inhaler albuterol sulfate HFA 90 mcg/actuation aerosol inhaler  INL 2 PFS PO QID     amLODipine (NORVASC) 10 MG tablet Take 10 mg by mouth daily.      escitalopram (LEXAPRO) 20 MG tablet TAKE 1 TABLET(20 MG) BY MOUTH DAILY 90 tablet 1   LORazepam (ATIVAN) 0.5 MG tablet Take 1 tablet (0.5 mg total) by mouth daily as needed for anxiety. 30 tablet 1   metoprolol tartrate (LOPRESSOR) 25 MG tablet Take 25 mg by mouth 2 (two) times daily.     mirtazapine (REMERON) 45 MG tablet Take 1 tablet (45 mg total) by mouth at bedtime. 30 tablet 1   SYMBICORT 160-4.5 MCG/ACT inhaler Inhale 1 puff into the lungs daily.     No current facility-administered medications for this visit.     Musculoskeletal: Strength & Muscle Tone:  N/A Gait & Station:  N/A Patient leans: N/A  Psychiatric Specialty Exam: Review of Systems  Psychiatric/Behavioral:  Positive for dysphoric mood and sleep disturbance. Negative for agitation, behavioral problems, confusion, decreased concentration, hallucinations, self-injury and suicidal ideas. The patient is nervous/anxious. The patient is not hyperactive.   All other systems reviewed and are negative.   There were no vitals taken for this visit.There is no height or weight on file to calculate BMI.  General Appearance: Well Groomed  Eye Contact:  Good  Speech:  Clear and Coherent  Volume:  Normal  Mood:  Depressed   Affect:  Appropriate, Congruent, and down, anxious  Thought Process:  Coherent  Orientation:  Full (Time, Place, and Person)  Thought Content: Logical   Suicidal Thoughts:  No  Homicidal Thoughts:  No  Memory:  Immediate;   Good  Judgement:  Good  Insight:  Good  Psychomotor Activity:  TD- lip smacking  Concentration:  Concentration: Good and Attention Span: Good  Recall:  Good  Fund of Knowledge: Good  Language: Good  Akathisia:  No  Handed:  Right  AIMS (if indicated): not done  Assets:  Communication Skills Desire for Improvement  ADL's:  Intact  Cognition: WNL  Sleep:  Poor   Screenings: PHQ2-9    Flowsheet Row Office Visit from 07/24/2022 in [facility] Office Visit from 05/16/2021 in [facility] Video Visit from 09/23/2020 in [facility] Video Visit from 07/29/2020 in [facility] Office Visit from 06/07/2020 in [facility]  PHQ-2 Total Score 0 2 0 0 2  PHQ-9 Total Score -- 6 -- -- 4      Flowsheet Row Video Visit from 09/23/2020 in [facility] Video Visit from 07/29/2020 in [facility] Office Visit from 06/07/2020 in [facility]  C-SSRS RISK CATEGORY No Risk No Risk No Risk        Assessment and Plan:  ALEXCIA SCHOOLS is a 68 y.o. year old female with a history of depression, COPD, hypertension, who presents for follow up appointment for below.   1. Mild episode of recurrent major depressive disorder (HCC) 2. Panic attack Acute stressors include: her daughter with back issues, loss of her brother after sepsis, recent admission Other stressors include: son in prison, who abused drug, loss of her husband in March 2018   History:  Abilify was  discontinued  in June 2024 due to concern of tardive dyskinesia. She was significant worsening in depressive symptoms, and continues to experience anxiety, although panic attacks has been less frequent since uptitration of mirtazapine.  Will add buspirone to target anxiety.  Discussed potential risk of serotonin syndrome, headache and drowsiness.  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  This medication could be reconsidered given she is not a good candidate for antipsychotics given tardive dyskinesia.  Will continue Lexapro and mirtazapine to target depression.  Noted that lorazepam was restarted since the last visit, although she was able to limit its use.  Will obtain UDS at the next visit. Although she will greatly benefit from CBT, she is not interested.  However, she is amenable for behavioral activation.  Will continue to assess her progress.   3. Tardive dyskinesia - likely secondary to Abilify Exam is notable for lipsmacking.  She is not interested in pharmacological treatment such as VMAT 2 inhibitor.  Will continue to assess and intervene.    3. Cigarette nicotine dependence without complication She smokes 0.5 PPD.  She has been smoking since 68 year old, and is not interested in quitting.  Provided psychoeducation regarding its impact on her physical health, and pharmacological treatment if she is interested.    Plan  Continue Lexapro 20 mg daily - per pharmacy, she has not filled since Nov 2 for 90 days. Will discuss this at her next visit.  Continue mirtazapine 45 mg at night Start buspirone 5 mg twice a day  Continue lorazepam 0.5 mg as needed for anxiety - refill left Next appointment: 4/4 at 11 am, video TSH is reportedly checked by her PCP- will recheck it at her next visit if that is not done recently.    Past trials of medication: sertraline, fluoxetine (became emotional), bupropion,  Abilify, trazodone  (drowsiness), Lunesta   The patient demonstrates the following risk factors for suicide: Chronic risk factors for suicide include: psychiatric disorder of depression, anxiety . Acute risk factors for suicide include: loss (financial, interpersonal, professional). Protective factors for this patient include: positive social support, responsibility to others (children, family), coping skills and hope for the future. Considering these factors, the overall suicide risk at this point appears to be low. Patient is appropriate for outpatient follow up.  Collaboration of Care: Collaboration of Care: Other reviewed notes in Epic  Patient/Guardian was advised Release of Information must be obtained prior to any record release in order to collaborate their care with an outside provider. Patient/Guardian was advised if they have not already done so to contact the registration department to sign all necessary forms in order for [place] to release information regarding their care.   Consent: Patient/Guardian gives verbal consent for treatment and assignment of benefits for services provided during this visit. Patient/Guardian expressed understanding and agreed to proceed.    Neysa Hotter, MD 03/21/2023, 10:36 AM

## 2023-03-21 ENCOUNTER — Telehealth (INDEPENDENT_AMBULATORY_CARE_PROVIDER_SITE_OTHER): Payer: 59 | Admitting: Psychiatry

## 2023-03-21 ENCOUNTER — Encounter: Payer: Self-pay | Admitting: Psychiatry

## 2023-03-21 DIAGNOSIS — G2401 Drug induced subacute dyskinesia: Secondary | ICD-10-CM | POA: Diagnosis not present

## 2023-03-21 DIAGNOSIS — F410 Panic disorder [episodic paroxysmal anxiety] without agoraphobia: Secondary | ICD-10-CM

## 2023-03-21 DIAGNOSIS — F330 Major depressive disorder, recurrent, mild: Secondary | ICD-10-CM

## 2023-03-21 DIAGNOSIS — F3341 Major depressive disorder, recurrent, in partial remission: Secondary | ICD-10-CM

## 2023-03-21 MED ORDER — ESCITALOPRAM OXALATE 20 MG PO TABS: 20.0000 mg | ORAL_TABLET | Freq: Every day | ORAL | 0 refills | Status: AC

## 2023-03-21 MED ORDER — MIRTAZAPINE 45 MG PO TABS: 45.0000 mg | ORAL_TABLET | Freq: Every day | ORAL | 0 refills | Status: AC

## 2023-03-21 MED ORDER — LORAZEPAM 0.5 MG PO TABS: 0.5000 mg | ORAL_TABLET | Freq: Every day | ORAL | 1 refills | Status: DC | PRN

## 2023-03-21 MED ORDER — BUSPIRONE HCL 5 MG PO TABS: 5.0000 mg | ORAL_TABLET | Freq: Two times a day (BID) | ORAL | 1 refills | Status: AC

## 2023-03-21 NOTE — Patient Instructions (Signed)
Continue Lexapro 20 mg daily Continue mirtazapine 45 mg at night Start buspirone 5 mg twice a day  Continue lorazepam 0.5 mg as needed for anxiety  Next appointment: 4/4 at 11 am,

## 2023-03-22 ENCOUNTER — Other Ambulatory Visit: Payer: Self-pay | Admitting: Psychiatry

## 2023-04-29 NOTE — Progress Notes (Unsigned)
 [name], female    DOB: 1955-06-15    MRN: [id number]   Brief patient profile:  76  yowf quit smoking Jan 2025 at admit  referred to pulmonary clinic in [facility]  05/04/2023 by [facility] hosp  for cough/ sob    Admit 02/25/23   68 y/o F with hx hypertension and recently treated pneumonia presented to ED   with hypoxia and worening SOB over the past 2 weeks and was recently told she had atypical pneumonia and was treated with oral doxycyline and steroids, and despite this she has continued to get worse with  cough, associated wheezing at this time. Pt was seen at urgent care earlier today however was sent to ED for further evaluation.   Pt vitals showed hypoxia in 80s on admission. CBC showed leukocytosis of 16.8, hgb of 11.6. BMP showed mild Aki with cr. 1.34, hyperglycemia with BG of 220 and severely elevated at 46, 0000 improved to 400. PT ABG showed respiratory acidosis with Ph of 7.32, pO2 of 60 and CO2 of 45.4. lactate high at 2.2. Influenza/COVID and flu assay were all negative. CXR showed moderate bilateral intersitial and airspace opacities in Right more than left, suggestive of multifocal pneumonia vs pulmonary edema. Pt was started on IV abx in the ED and given, IV lasix, IV steroids, and multiple duoneb treatments. Pt being admitted for acute hypercapneic respiratory failure.     ASSESSMENT & PLAN (In order of descending acuity)  Acute hypoxic respiratory failure -More likely due to acute systolic heart failure however also likely a component of multifocal pneumonia -CXR showing bilateral infiltrates R>>L, and recently treated for mycoplasma pneumonia -pt still requiring supplemental oxygen, continue to wean, required 3 L of oxygen with ambulation, case management working on arranging this -s/p pneumonia tx as below  -2D echo done showing severly depressed EF with 15-20% function    2. New onset systolic CHF -pt had pulmonary edema on admission -2D echo showed severely  reduced EF of 15-20% with moderate MR -Patient tolerated diuresis very well, transition to p.o. Lasix 20 mg twice daily at discharge -Patient to continue 25 mg metoprolol succinate at discharge -seen by cardiology in consult, appreciate further recommendations, patient tolerating Entresto and Jardiance, l and low-dose Aldactone all of which have been prescribed -further wokrup as per cardiology recs, would benefit from outpatient ischemic workup including cardiac cath  3. Community acquired pneumonia -Pt presented with worsening SOB, lactate high and elevated wbc count -failed atypical PNA treatment with doxycyline -conitnue with IV rocephin/azithromycin -viral assays negative  4. COPD exacerbation versus acute systolic heart failure -pt having some mild wheezing and does have diagnosis of COPD -No longer needing of any corticosteroids -Respirations clear bilaterally, case management has arranged for nebulizer machine through [employer] health DME company  5.Hypertension -continue with home lopressor, other GDMT adjusted as above     History of Present Illness  05/04/2023  Pulmonary/ 1st office eval/ [name] / [name] supposed to be on symbiocrt 160 but not using nor   using saba much either Chief Complaint  Patient presents with   New Patient (Initial Visit)  Dyspnea:  leans on cart at [employer]  Cough: mucus is white worse p waking each am  Sleep: flat one bed one pillow s resp cc  SABA use: hfa once every few days 02: 2lpm since d/c hs and during the day prn but rarely uses it.     No obvious day to day or daytime pattern/variability  or assoc   mucus plugs or hemoptysis or cp or chest tightness, subjective wheeze or overt sinus or hb symptoms.    Also denies any obvious fluctuation of symptoms with weather or environmental changes or other aggravating or alleviating factors except as outlined above   No unusual exposure hx or h/o childhood pna/ asthma or knowledge of premature  birth.  Current Allergies, Complete Past Medical History, Past Surgical History, Family History, and Social History were reviewed in [employer] record.  ROS  The following are not active complaints unless bolded Hoarseness, sore throat, dysphagia, dental problems, itching, sneezing,  nasal congestion or discharge of excess mucus or purulent secretions, ear ache,   fever, chills, sweats, unintended wt loss or wt gain, classically pleuritic or exertional cp,  orthopnea pnd or arm/hand swelling  or leg swelling, presyncope, palpitations, abdominal pain, anorexia, nausea, vomiting, diarrhea  or change in bowel habits or change in bladder habits, change in stools or change in urine, dysuria, hematuria,  rash, arthralgias, visual complaints, headache, numbness, weakness or ataxia or problems with walking or coordination,  change in mood or  memory.            Outpatient Medications Prior to Visit  Medication Sig Dispense Refill   albuterol (VENTOLIN HFA) 108 (90 Base) MCG/ACT inhaler albuterol sulfate HFA 90 mcg/actuation aerosol inhaler  INL 2 PFS PO QID     busPIRone (BUSPAR) 5 MG tablet Take 1 tablet (5 mg total) by mouth 2 (two) times daily. 60 tablet 1   escitalopram (LEXAPRO) 20 MG tablet Take 1 tablet (20 mg total) by mouth daily. 90 tablet 0   mirtazapine (REMERON) 45 MG tablet Take 1 tablet (45 mg total) by mouth at bedtime. 90 tablet 0   amLODipine (NORVASC) 10 MG tablet Take 10 mg by mouth daily.      LORazepam (ATIVAN) 0.5 MG tablet Take 1 tablet (0.5 mg total) by mouth daily as needed for anxiety. 30 tablet 1   metoprolol tartrate (LOPRESSOR) 25 MG tablet Take 25 mg by mouth 2 (two) times daily.     SYMBICORT 160-4.5 MCG/ACT inhaler Inhale 1 puff into the lungs daily.     No facility-administered medications prior to visit.    Past Medical History:  Diagnosis Date   Anxiety    Arthritis    Complication of anesthesia    Depression    Hypertension    PONV  (postoperative nausea and vomiting)       Objective:     BP 116/71   Pulse 77   Ht 5' (1.524 m)   Wt 156 lb 9.6 oz (71 kg)   SpO2 95% Comment: room air  BMI 30.58 kg/m   SpO2: 95 % (room air)  Very pleasant amb wf , minimally congested sounding spont rattle/cough and raspy voice texture  HEENT : Oropharynx  clear   Nasal turbinates nl    NECK :  without  apparent JVD/ palpable Nodes/TM    LUNGS: no acc muscle use,  Mild barrel  contour chest wall with bilateral  Distant bs s audible wheeze and  without cough on insp or exp maneuvers  and mild  Hyperresonant  to  percussion bilaterally     CV:  RRR  no s3 or murmur or increase in P2, and no edema   ABD:  soft and nontender with pos end  insp Hoover's  in the supine position.  No bruits or organomegaly appreciated   MS:  Nl  gait/ ext warm without deformities Or obvious joint restrictions  calf tenderness, cyanosis or clubbing     SKIN: warm and dry without lesions    NEURO:  alert, approp, nl sensorium with  no motor or cerebellar deficits apparent.     CXR PA and Lateral:   05/04/2023 :    I personally reviewed images and impression is as follows:     Mod kyphosis/ mod CM s chf     Assessment   COPD GOLD ? Quit smoking Jan 2025 with admit [facility] with chf and d/c on 02  - 05/04/2023  After extensive coaching inhaler device,  effectiveness =    60% (uneven / short ti) > continue prn saba hfa/ neb  - 05/04/2023 no desats walkng RA - PFTs ordered 05/04/2023   Recent admit explained by chf in setting of recent atypical pna and not "copd ex" though she could have had "cardiac "asthma"   - she has not been using symbicort and for now probably doesn't need it as much as she needs to breathe clean air (remain off cigs) and use saba appropriately (both the when and the how were reviewed today)  Re SABA :  I spent extra time with pt today reviewing appropriate use of albuterol for prn use on exertion with the following  points: 1) saba is for relief of sob that does not improve by walking a slower pace or resting but rather if the pt does not improve after trying this first. 2) If the pt is convinced, as many are, that saba helps recover from activity faster then it's easy to tell if this is the case by re-challenging : ie stop, take the inhaler, then p 5 minutes try the exact same activity (intensity of workload) that just caused the symptoms and see if they are substantially diminished or not after saba 3) if there is an activity that reproducibly causes the symptoms, try the saba 15 min before the activity on alternate days   If in fact the saba really does help, then fine to continue to use it prn but advised may need to look closer at the maintenance regimen (for now =0)  being used to achieve better control of airways disease with exertion.   Chronic respiratory failure with hypoxia and hypercapnia (HCC) Placed on 02 at D/C from [facility] with dx chf in Jan 2025  -  HC03   34   03/02/23  - 05/04/2023   Walked on RA   x  3  lap(s) =  approx 450  ft  @ brisk then slowed pace, stopped due to end of study  with lowest 02 sats 91% on 1st lap c/w sob then as well but sats up to 92% at end at more moderate pace  Rec continue 02 2lpm hs and prn daytime:  Instructed:  Make sure you check your oxygen saturation  AT  your highest level of activity (not after you stop)   to be sure it stays over 90% and adjust  02 flow upward to maintain this level if needed but remember to turn it back to previous settings when you stop (to conserve your supply).   Former cigarette smoker Quit smoking Jan 2025  Low-dose CT lung cancer screening is recommended for patients who are 65-103 years of age with a 20+ pack-year history of smoking and who are currently smoking or quit <=15 years ago. No coughing up blood  No unintentional weight loss of > 15  pounds in the last 6 months - pt is eligible for scanning yearly until age 90/  advised   If not done in [place] will offer the LDSCT program here, whichever she/ Dr Sherryll Burger prefer.  Discussed in detail all the  indications, usual  risks and alternatives  relative to the benefits with patient who agrees to proceed with w/u as outlined.     Each maintenance medication was reviewed in detail including emphasizing most importantly the difference between maintenance and prns and under what circumstances the prns are to be triggered using an action plan format where appropriate.  Total time for H and P, chart review, counseling, reviewing hfa/neb/02/pulse ox device(s) , directly observing portions of ambulatory 02 saturation study/ and generating customized AVS unique to this office visit / same day charting = 60 min new pt eval                  Sandrea Hughs, MD 05/04/2023

## 2023-05-04 ENCOUNTER — Encounter: Payer: Self-pay | Admitting: Internal Medicine

## 2023-05-04 ENCOUNTER — Ambulatory Visit (HOSPITAL_COMMUNITY)
Admission: RE | Admit: 2023-05-04 | Discharge: 2023-05-04 | Disposition: A | Discharge status: Home / Self Care | Source: Ambulatory Visit | Attending: Internal Medicine | Admitting: Internal Medicine

## 2023-05-04 ENCOUNTER — Ambulatory Visit (INDEPENDENT_AMBULATORY_CARE_PROVIDER_SITE_OTHER): Payer: Self-pay | Admitting: Internal Medicine

## 2023-05-04 VITALS — BP 116/71 | HR 77 | Ht 60.0 in | Wt 156.6 lb

## 2023-05-04 DIAGNOSIS — J9612 Chronic respiratory failure with hypercapnia: Secondary | ICD-10-CM | POA: Diagnosis not present

## 2023-05-04 DIAGNOSIS — J449 Chronic obstructive pulmonary disease, unspecified: Secondary | ICD-10-CM | POA: Insufficient documentation

## 2023-05-04 DIAGNOSIS — F17210 Nicotine dependence, cigarettes, uncomplicated: Secondary | ICD-10-CM

## 2023-05-04 DIAGNOSIS — J9611 Chronic respiratory failure with hypoxia: Secondary | ICD-10-CM

## 2023-05-04 DIAGNOSIS — Z87891 Personal history of nicotine dependence: Secondary | ICD-10-CM | POA: Insufficient documentation

## 2023-05-04 NOTE — Assessment & Plan Note (Signed)
 Quit smoking Jan 2025 with admit [facility] with chf and d/c on 02  - 05/04/2023  After extensive coaching inhaler device,  effectiveness =    60% (uneven / short ti) > continue prn saba hfa/ neb  - 05/04/2023 no desats walkng RA - PFTs ordered 05/04/2023   Recent admit explained by chf in setting of recent atypical pna and not "copd ex" though she could have had "cardiac "asthma"   - she has not been using symbicort and for now probably doesn't need it as much as she needs to breathe clean air (remain off cigs) and use saba appropriately (both the when and the how were reviewed today)  Re SABA :  I spent extra time with pt today reviewing appropriate use of albuterol for prn use on exertion with the following points: 1) saba is for relief of sob that does not improve by walking a slower pace or resting but rather if the pt does not improve after trying this first. 2) If the pt is convinced, as many are, that saba helps recover from activity faster then it's easy to tell if this is the case by re-challenging : ie stop, take the inhaler, then p 5 minutes try the exact same activity (intensity of workload) that just caused the symptoms and see if they are substantially diminished or not after saba 3) if there is an activity that reproducibly causes the symptoms, try the saba 15 min before the activity on alternate days   If in fact the saba really does help, then fine to continue to use it prn but advised may need to look closer at the maintenance regimen (for now =0)  being used to achieve better control of airways disease with exertion.

## 2023-05-04 NOTE — Assessment & Plan Note (Signed)
 Quit smoking Jan 2025  Low-dose CT lung cancer screening is recommended for patients who are 38-70 years of age with a 20+ pack-year history of smoking and who are currently smoking or quit <=15 years ago. No coughing up blood  No unintentional weight loss of > 15 pounds in the last 6 months - pt is eligible for scanning yearly until age 1/ advised   If not done in [place] will offer the LDSCT program here, whichever she/ Dr Sherryll Burger prefer.  Discussed in detail all the  indications, usual  risks and alternatives  relative to the benefits with patient who agrees to proceed with w/u as outlined.     Each maintenance medication was reviewed in detail including emphasizing most importantly the difference between maintenance and prns and under what circumstances the prns are to be triggered using an action plan format where appropriate.  Total time for H and P, chart review, counseling, reviewing hfa/neb/02/pulse ox device(s) , directly observing portions of ambulatory 02 saturation study/ and generating customized AVS unique to this office visit / same day charting = 60 min new pt eval

## 2023-05-04 NOTE — Assessment & Plan Note (Signed)
 Placed on 02 at D/C from [facility] with dx chf in Jan 2025  -  HC03   32   03/02/23  - 05/04/2023   Walked on RA   x  3  lap(s) =  approx 450  ft  @ brisk then slowed pace, stopped due to end of study  with lowest 02 sats 91% on 1st lap c/w sob then as well but sats up to 92% at end at more moderate pace  Rec continue 02 2lpm hs and prn daytime:  Instructed:  Make sure you check your oxygen saturation  AT  your highest level of activity (not after you stop)   to be sure it stays over 90% and adjust  02 flow upward to maintain this level if needed but remember to turn it back to previous settings when you stop (to conserve your supply).

## 2023-05-04 NOTE — Patient Instructions (Addendum)
 Please remember to go to the  x-ray department  @  [facility] for your tests - we will call you with the results when they are available      Plan A = Automatic = Always=    Breathe clean air   Plan B = Backup (to supplement plan A, not to replace it) Only use your albuterol inhaler as a rescue medication to be used if you can't catch your breath by resting or doing a relaxed purse lip breathing pattern.  - The less you use it, the better it will work when you need it. - Ok to use the inhaler up to 2 puffs  every 4 hours if you must but call for appointment if use goes up over your usual need - Don't leave home without it !!  (think of it like the spare tire for your car)   Work on inhaler technique:  relax and gently blow all the way out then take a nice smooth full deep breath back in, triggering the inhaler at same time you start breathing in.  Hold breath in for at least  5 seconds if you can.      Plan C = Crisis (instead of Plan B but only if Plan B stops working) - only use your albuterol nebulizer if you first try Plan B and it fails to help > ok to use the nebulizer up to every 4 hours but if start needing it regularly call for immediate appointment  Also  Ok to try albuterol 15 min before an activity (on alternating days)  that you know would usually make you short of breath and see if it makes any difference and if makes none then don't take albuterol after activity unless you can't catch your breath as this means it's the resting that helps, not the albuterol.    Make sure you check your oxygen saturation at your highest level of activity(NOT after you stop)  to be sure it stays over 90% and keep track of it at least once a week, more often if breathing getting worse, and let me know if losing ground. (Collect the dots to connect the dots approach)    Please remember to go to the  x-ray department  @  [facility] for your tests - we will call you with the results  when they are available     Please schedule a follow up visit in 3 months but call sooner if needed - PFTs first

## 2023-05-05 NOTE — Progress Notes (Deleted)
 BH MD/PA/NP OP Progress Note  05/05/2023 5:52 PM [name]  MRN:  [id number]  Chief Complaint: No chief complaint on file.  HPI: *** Visit Diagnosis: No diagnosis found.  Past Psychiatric History: Please see initial evaluation for full details. I have reviewed the history. No updates at this time.     Past Medical History:  Past Medical History:  Diagnosis Date   Anxiety    Arthritis    Complication of anesthesia    Depression    Hypertension    PONV (postoperative nausea and vomiting)     Past Surgical History:  Procedure Laterality Date   FOOT NEUROMA SURGERY     right foot   KNEE SURGERY     right arthroscopic   TOTAL KNEE ARTHROPLASTY Left 03/05/2018   Procedure: TOTAL KNEE ARTHROPLASTY;  Surgeon: Durene Romans, MD;  Location: [facility];  Service: Orthopedics;  Laterality: Left;  70 minutes   TOTAL KNEE ARTHROPLASTY Right 04/09/2018   Procedure: TOTAL KNEE ARTHROPLASTY;  Surgeon: Durene Romans, MD;  Location: [facility];  Service: Orthopedics;  Laterality: Right;  70 mins    Family Psychiatric History: Please see initial evaluation for full details. I have reviewed the history. No updates at this time.     Family History:  Family History  Problem Relation Age of Onset   Drug abuse Son     Social History:  Social History   Socioeconomic History   Marital status: Widowed    Spouse name: Not on file   Number of children: Not on file   Years of education: Not on file   Highest education level: Not on file  Occupational History   Not on file  Tobacco Use   Smoking status: Every Day    Current packs/day: 0.25    Average packs/day: 0.3 packs/day for 40.1 years (10.0 ttl pk-yrs)    Types: Cigarettes    Start date: 10/15/2017   Smokeless tobacco: Never   Tobacco comments:    smokes 2 cigarettes a day  Vaping Use   Vaping status: Never Used  Substance and Sexual Activity   Alcohol use: Yes    Comment: occasional. 11-15-2016 occa   Drug use: No    Comment:  11-15-2016 per pt no   Sexual activity: Not Currently  Other Topics Concern   Not on file  Social History Narrative   Not on file   Social Drivers of Health   Financial Resource Strain: Low Risk  (02/26/2023)   Received from [facility]   Overall Financial Resource Strain (CARDIA)    Difficulty of Paying Living Expenses: Not hard at all  Food Insecurity: No Food Insecurity (02/26/2023)   Received from [facility]   Hunger Vital Sign    Worried About Running Out of Food in the Last Year: Never true    Ran Out of Food in the Last Year: Never true  Transportation Needs: No Transportation Needs (02/26/2023)   Received from [facility] - Transportation    Lack of Transportation (Medical): No    Lack of Transportation (Non-Medical): No  Physical Activity: Patient Unable To Answer (02/25/2023)   Received from [facility]   Exercise Vital Sign    Days of Exercise per Week: Patient unable to answer    Minutes of Exercise per Session: Patient unable to answer  Stress: Stress Concern Present (02/25/2023)   Received from [facility] - Occupational  Stress Questionnaire    Feeling of Stress : To some extent  Social Connections: Moderately Integrated (02/25/2023)   Received from [facility]   Social Connection and Isolation Panel [NHANES]    Frequency of Communication with Friends and Family: More than three times a week    Frequency of Social Gatherings with Friends and Family: Three times a week    Attends Religious Services: 1 to 4 times per year    Active Member of Clubs or Organizations: Yes    Attends Banker Meetings: 1 to 4 times per year    Marital Status: Widowed    Allergies:  Allergies  Allergen Reactions   Percocet [Oxycodone-Acetaminophen] Nausea And Vomiting    Metabolic Disorder Labs: No results found for: "HGBA1C", "MPG" No results found for: "PROLACTIN" No results found for:  "CHOL", "TRIG", "HDL", "CHOLHDL", "VLDL", "LDLCALC" Lab Results  Component Value Date   TSH 1.889 ***Test methodology is 3rd generation TSH*** 07/18/2007    Therapeutic Level Labs: No results found for: "LITHIUM" No results found for: "VALPROATE" No results found for: "CBMZ"  Current Medications: Current Outpatient Medications  Medication Sig Dispense Refill   albuterol (VENTOLIN HFA) 108 (90 Base) MCG/ACT inhaler albuterol sulfate HFA 90 mcg/actuation aerosol inhaler  INL 2 PFS PO QID     busPIRone (BUSPAR) 5 MG tablet Take 1 tablet (5 mg total) by mouth 2 (two) times daily. 60 tablet 1   escitalopram (LEXAPRO) 20 MG tablet Take 1 tablet (20 mg total) by mouth daily. 90 tablet 0   mirtazapine (REMERON) 45 MG tablet Take 1 tablet (45 mg total) by mouth at bedtime. 90 tablet 0   No current facility-administered medications for this visit.     Musculoskeletal: Strength & Muscle Tone:  N/A Gait & Station:  N/A Patient leans: N/A  Psychiatric Specialty Exam: Review of Systems  There were no vitals taken for this visit.There is no height or weight on file to calculate BMI.  General Appearance: {Appearance:22683}  Eye Contact:  {BHH EYE CONTACT:22684}  Speech:  Clear and Coherent  Volume:  Normal  Mood:  {BHH MOOD:22306}  Affect:  {Affect (PAA):22687}  Thought Process:  Coherent  Orientation:  Full (Time, Place, and Person)  Thought Content: Logical   Suicidal Thoughts:  {ST/HT (PAA):22692}  Homicidal Thoughts:  {ST/HT (PAA):22692}  Memory:  Immediate;   Good  Judgement:  {Judgement (PAA):22694}  Insight:  {Insight (PAA):22695}  Psychomotor Activity:  Normal  Concentration:  Concentration: Good and Attention Span: Good  Recall:  Good  Fund of Knowledge: Good  Language: Good  Akathisia:  No  Handed:  Right  AIMS (if indicated): not done  Assets:  Communication Skills Desire for Improvement  ADL's:  Intact  Cognition: WNL  Sleep:  {BHH GOOD/FAIR/POOR:22877}    Screenings: Equities trader Office Visit from 07/24/2022 in [facility] Office Visit from 05/16/2021 in [facility] Video Visit from 09/23/2020 in [facility] Video Visit from 07/29/2020 in [facility] Office Visit from 06/07/2020 in [facility]  PHQ-2 Total Score 0 2 0 0 2  PHQ-9 Total Score -- 6 -- -- 4      Flowsheet Row Video Visit from 09/23/2020 in [facility] Video Visit from 07/29/2020 in [facility] Office Visit from 06/07/2020 in [facility]  C-SSRS RISK CATEGORY No Risk No Risk No Risk        Assessment and Plan:  [name] is a 68 y.o. year old female with a history of depression, COPD, hypertension, who presents for follow up appointment for below.    1. Mild episode of recurrent major depressive disorder (HCC) 2. Panic attack Acute stressors include: her daughter with back issues, loss of her brother after sepsis, recent admission Other stressors include: son in prison, who abused drug, loss of her husband in March 2018   History:  Abilify was discontinued in June 2024 due to concern of tardive dyskinesia. She was significant worsening in depressive symptoms, and continues to experience anxiety, although panic attacks has been less frequent since uptitration of mirtazapine.  Will add buspirone to target anxiety.  Discussed potential risk of serotonin syndrome, headache and drowsiness.  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  This medication could be reconsidered given she is not a good candidate for antipsychotics given tardive dyskinesia.  Will continue Lexapro and mirtazapine  to target depression.  Noted that lorazepam was restarted since the last visit, although she was able to limit its use.  Will obtain UDS at the next visit. Although she will greatly benefit from CBT, she is not interested.  However, she is amenable for behavioral activation.  Will continue to assess her progress.    3. Tardive dyskinesia - likely secondary to Abilify Exam is notable for lipsmacking.  She is not interested in pharmacological treatment such as VMAT 2 inhibitor.  Will continue to assess and intervene.    3. Cigarette nicotine dependence without complication She smokes 0.5 PPD.  She has been smoking since 68 year old, and is not interested in quitting.  Provided psychoeducation regarding its impact on her physical health, and pharmacological treatment if she is interested.    Plan  Continue Lexapro 20 mg daily - per pharmacy, she has not filled since Nov 2 for 90 days. Will discuss this at her next visit.  Continue mirtazapine 45 mg at night Start buspirone 5 mg twice a day  Continue lorazepam 0.5 mg as needed for anxiety - refill left Next appointment: 4/4 at 11 am, video TSH is reportedly checked by her PCP- will recheck it at her next visit if that is not done recently.    Past trials of medication: sertraline, fluoxetine (became emotional), bupropion,  Abilify, trazodone (drowsiness), Lunesta   The patient demonstrates the following risk factors for suicide: Chronic risk factors for suicide include: psychiatric disorder of depression, anxiety . Acute risk factors for suicide include: loss (financial, interpersonal, professional). Protective factors for this patient include: positive social support, responsibility to others (children, family), coping skills and hope for the future. Considering these factors, the overall suicide risk at this point appears to be low. Patient is appropriate for outpatient follow up.  Collaboration of Care: Collaboration of Care: {BH OP Collaboration  of Care:21014065}  Patient/Guardian was advised Release of Information must be obtained prior to any record release in order to collaborate their care with an outside provider. Patient/Guardian was advised if they have not already done so to contact the registration department to sign all necessary forms in order for [place] to release information regarding their care.   Consent: Patient/Guardian gives verbal consent for treatment and assignment of benefits for services provided during this visit. Patient/Guardian expressed understanding and agreed to proceed.    [place],  MD 05/05/2023, 5:52 PM

## 2023-05-08 ENCOUNTER — Telehealth: Payer: Self-pay | Admitting: Psychiatry

## 2023-05-08 NOTE — Telephone Encounter (Signed)
 PT stated that due to heart failure diagnosis earlier this year, her cardiologist will now take over all of her medications and cancelled her future appt with you that was scheduled for 05/11/23.

## 2023-05-11 ENCOUNTER — Telehealth: Payer: 59 | Admitting: Psychiatry

## 2023-06-21 ENCOUNTER — Other Ambulatory Visit: Payer: Self-pay | Admitting: Psychiatry

## 2023-06-21 DIAGNOSIS — F3341 Major depressive disorder, recurrent, in partial remission: Secondary | ICD-10-CM

## 2023-08-05 NOTE — Progress Notes (Unsigned)
 [name], female    DOB: 27-May-1955    MRN: [id number]   Brief patient profile:  51  yowf quit smoking daily  Jan 2025 at admit  referred to pulmonary clinic in [facility]  05/04/2023 by [facility] hosp  for cough/ sob    Admit 02/25/23   68 y/o F with hx hypertension and recently treated pneumonia presented to ED   with hypoxia and worening SOB over the past 2 weeks and was recently told she had atypical pneumonia and was treated with oral doxycyline and steroids, and despite this she has continued to get worse with  cough, associated wheezing at this time. Pt was seen at urgent care earlier today however was sent to ED for further evaluation.   Pt vitals showed hypoxia in 80s on admission. CBC showed leukocytosis of 16.8, hgb of 11.6. BMP showed mild Aki with cr. 1.34, hyperglycemia with BG of 220 and severely elevated at 46, 0000 improved to 400. PT ABG showed respiratory acidosis with Ph of 7.32, pO2 of 60 and CO2 of 45.4. lactate high at 2.2. Influenza/COVID and flu assay were all negative. CXR showed moderate bilateral intersitial and airspace opacities in Right more than left, suggestive of multifocal pneumonia vs pulmonary edema. Pt was started on IV abx in the ED and given, IV lasix, IV steroids, and multiple duoneb treatments. Pt being admitted for acute hypercapneic respiratory failure.     ASSESSMENT & PLAN (In order of descending acuity)  Acute hypoxic respiratory failure -More likely due to acute systolic heart failure however also likely a component of multifocal pneumonia -CXR showing bilateral infiltrates R>>L, and recently treated for mycoplasma pneumonia -pt still requiring supplemental oxygen, continue to wean, required 3 L of oxygen with ambulation, case management working on arranging this -s/p pneumonia tx as below  -2D echo done showing severly depressed EF with 15-20% function    2. New onset systolic CHF -pt had pulmonary edema on admission -2D echo showed  severely reduced EF of 15-20% with moderate MR -Patient tolerated diuresis very well, transition to p.o. Lasix 20 mg twice daily at discharge -Patient to continue 25 mg metoprolol  succinate at discharge -seen by cardiology in consult, appreciate further recommendations, patient tolerating Entresto and Jardiance, l and low-dose Aldactone all of which have been prescribed -further wokrup as per cardiology recs, would benefit from outpatient ischemic workup including cardiac cath  3. Community acquired pneumonia -Pt presented with worsening SOB, lactate high and elevated wbc count -failed atypical PNA treatment with doxycyline -conitnue with IV rocephin/azithromycin -viral assays negative  4. COPD exacerbation versus acute systolic heart failure -pt having some mild wheezing and does have diagnosis of COPD -No longer needing of any corticosteroids -Respirations clear bilaterally, case management has arranged for nebulizer machine through [employer] DME company  5.Hypertension -continue with home lopressor , other GDMT adjusted as above     History of Present Illness  05/04/2023  Pulmonary/ 1st office eval/ [name] / [place] [facility] supposed to be on symbiocrt 160 but not using nor   using saba much either Chief Complaint  Patient presents with   New Patient (Initial Visit)  Dyspnea:  leans on cart at [employer]  Cough: mucus is white worse p waking each am  Sleep: flat one bed one pillow s resp cc  SABA use: hfa once every few days 02: 2lpm since d/c hs and during the day prn but rarely uses it.  Rec Please remember to go to the  x-ray  department  @  [facility] for your tests - we will call you with the results when they are available    Plan A = Automatic = Always=    Breathe clean air  Plan B = Backup (to supplement plan A, not to replace it) Only use your albuterol inhaler as a rescue medication Work on inhaler technique:  relax and gently blow all the way out then take a  nice smooth full deep breath back in, triggering the inhaler at same time you start breathing in.  Hold breath in for at least  5 seconds if you can.   Plan C = Crisis (instead of Plan B but only if Plan B stops working) - only use your albuterol nebulizer if you first try Plan B Also  Ok to try albuterol 15 min before an activity (on alternating days)  that you know would usually make you short of breath and see if it makes any difference and if makes none then don't take albuterol after activity unless you can't catch your breath as this means it's the resting that helps, not the albuterol. Make sure you check your oxygen saturation at your highest level of activity(NOT after you stop)  to be sure it stays over 90% and keep track of it at least once a week, more often if breathing getting worse, and let me know if losing ground. (Collect the dots to connect the dots approach)    Please schedule a follow up visit in 3 months but call sooner if needed - PFTs first    08/07/2023  f/u ov/[place] office/[name] re: sob/ cough maint on prn albuterol   Chief Complaint  Patient presents with   Follow-up   COPD   Dyspnea:  leans on cart at [employer] p [facility] parking  Cough: better since quit  Sleeping: bed is flat with one pillow s  resp cc  SABA use: none  02: none   Lung cancer screening: referred 08/07/2023    No obvious day to day or daytime variability or assoc excess/ purulent sputum or mucus plugs or hemoptysis or cp or chest tightness, subjective wheeze or overt sinus or hb symptoms.    Also denies any obvious fluctuation of symptoms with weather or environmental changes or other aggravating or alleviating factors except as outlined above   No unusual exposure hx or h/o childhood pna/ asthma or knowledge of premature birth.  Current Allergies, Complete Past Medical History, Past Surgical History, Family History, and Social History were reviewed in [employer] record.  ROS   The following are not active complaints unless bolded Hoarseness, sore throat, dysphagia, dental problems, itching, sneezing,  nasal congestion or discharge of excess mucus or purulent secretions, ear ache,   fever, chills, sweats, unintended wt loss or wt gain, classically pleuritic or exertional cp,  orthopnea pnd or arm/hand swelling  or leg swelling, presyncope, palpitations, abdominal pain, anorexia, nausea, vomiting, diarrhea  or change in bowel habits or change in bladder habits, change in stools or change in urine, dysuria, hematuria,  rash, arthralgias, visual complaints, headache, numbness, weakness or ataxia or problems with walking or coordination,  change in mood or  memory.        Current Meds  Medication Sig   albuterol (VENTOLIN HFA) 108 (90 Base) MCG/ACT inhaler albuterol sulfate HFA 90 mcg/actuation aerosol inhaler  INL 2 PFS PO QID   ALPRAZolam (XANAX) 0.5 MG tablet Take 0.5 mg by mouth 2 (two) times daily  as needed.   escitalopram  (LEXAPRO ) 20 MG tablet Take 1 tablet (20 mg total) by mouth daily.   furosemide (LASIX) 20 MG tablet Take 20 mg by mouth daily.   JARDIANCE 10 MG TABS tablet Take 10 mg by mouth daily.   mirtazapine  (REMERON ) 45 MG tablet Take 1 tablet (45 mg total) by mouth at bedtime.            Past Medical History:  Diagnosis Date   Anxiety    Arthritis    Complication of anesthesia    Depression    Hypertension    PONV (postoperative nausea and vomiting)       Objective:    Wt Readings from Last 3 Encounters:  08/07/23 162 lb (73.5 kg)  05/04/23 156 lb 9.6 oz (71 kg)  04/09/18 159 lb 2.8 oz (72.2 kg)      Vital signs reviewed  08/07/2023  - Note at rest 02 sats  95% on RA   General appearance:    amb wf still has smoker's hack/ rattle     .HEENT : Oropharynx  clear   Nasal turbinates nl    NECK :  without  apparent JVD/ palpable Nodes/TM    LUNGS: no acc muscle use,  Mild barrel /kyphotic contour chest wall with bilateral  distant  insp/exp rhonchi  and  without cough on insp or exp maneuvers  and mild  Hyperresonant  to  percussion bilaterally     CV:  RRR  no s3 or murmur or increase in P2, and no edema   ABD:  soft and nontender with pos end  insp Hoover's  in the supine position.  No bruits or organomegaly appreciated   MS:  Nl gait/ ext warm without deformities Or obvious joint restrictions  calf tenderness, cyanosis or clubbing     SKIN: warm and dry without lesions    NEURO:  alert, approp, nl sensorium with  no motor or cerebellar deficits apparent.         Assessment

## 2023-08-07 ENCOUNTER — Ambulatory Visit: Admitting: Internal Medicine

## 2023-08-07 ENCOUNTER — Ambulatory Visit (HOSPITAL_COMMUNITY)
Admission: RE | Admit: 2023-08-07 | Discharge: 2023-08-07 | Disposition: A | Discharge status: Home / Self Care | Source: Ambulatory Visit | Attending: Internal Medicine | Admitting: Internal Medicine

## 2023-08-07 ENCOUNTER — Encounter: Payer: Self-pay | Admitting: Internal Medicine

## 2023-08-07 VITALS — BP 109/69 | HR 97 | Ht 60.0 in | Wt 162.0 lb

## 2023-08-07 DIAGNOSIS — J984 Other disorders of lung: Secondary | ICD-10-CM | POA: Insufficient documentation

## 2023-08-07 DIAGNOSIS — F17210 Nicotine dependence, cigarettes, uncomplicated: Secondary | ICD-10-CM | POA: Diagnosis present

## 2023-08-07 DIAGNOSIS — J449 Chronic obstructive pulmonary disease, unspecified: Secondary | ICD-10-CM

## 2023-08-07 DIAGNOSIS — Z87891 Personal history of nicotine dependence: Secondary | ICD-10-CM

## 2023-08-07 DIAGNOSIS — R0609 Other forms of dyspnea: Secondary | ICD-10-CM | POA: Diagnosis present

## 2023-08-07 LAB — PULMONARY FUNCTION TEST
DL/VA % pred: 85 %
DL/VA: 3.65 ml/min/mmHg/L
DLCO unc % pred: 77 %
DLCO unc: 13.49 ml/min/mmHg
FEF 25-75 Post: 0.84 L/s
FEF 25-75 Pre: 0.7 L/s
FEF2575-%Change-Post: 19 %
FEF2575-%Pred-Post: 47 %
FEF2575-%Pred-Pre: 39 %
FEV1-%Change-Post: 10 %
FEV1-%Pred-Post: 63 %
FEV1-%Pred-Pre: 57 %
FEV1-Post: 1.28 L
FEV1-Pre: 1.16 L
FEV1FVC-%Change-Post: -3 %
FEV1FVC-%Pred-Pre: 84 %
FEV6-%Change-Post: 11 %
FEV6-%Pred-Post: 77 %
FEV6-%Pred-Pre: 69 %
FEV6-Post: 1.99 L
FEV6-Pre: 1.78 L
FEV6FVC-%Change-Post: -2 %
FEV6FVC-%Pred-Post: 101 %
FEV6FVC-%Pred-Pre: 103 %
FVC-%Change-Post: 14 %
FVC-%Pred-Post: 77 %
FVC-%Pred-Pre: 67 %
FVC-Post: 2.06 L
FVC-Pre: 1.8 L
Post FEV1/FVC ratio: 62 %
Post FEV6/FVC ratio: 96 %
Pre FEV1/FVC ratio: 64 %
Pre FEV6/FVC Ratio: 99 %
RV % pred: 140 %
RV: 2.83 L
TLC % pred: 105 %
TLC: 4.88 L

## 2023-08-07 MED ORDER — ALBUTEROL SULFATE (2.5 MG/3ML) 0.083% IN NEBU
2.5000 mg | INHALATION_SOLUTION | Freq: Once | RESPIRATORY_TRACT | Status: AC
  Administered 2023-08-07: 2.5 mg via RESPIRATORY_TRACT

## 2023-08-07 NOTE — Assessment & Plan Note (Addendum)
 Quit smoking Jan 2025   - referred to [facility] 08/07/2023 >>>   Low-dose CT lung cancer screening is recommended for patients who are 71-70 years of age with a 20+ pack-year history of smoking and who are currently smoking or quit <=15 years ago. No coughing up blood  No unintentional weight loss of > 15 pounds in the last 6 months - pt is eligible for scanning yearly until age 31   Discussed in detail all the  indications, usual  risks and alternatives  relative to the benefits with patient who agrees to proceed with w/u as outlined.      >>>  F/u  q 6 m,  sooner prn  Each maintenance medication was reviewed in detail including emphasizing most importantly the difference between maintenance and prns and under what circumstances the prns are to be triggered using an action plan format where appropriate.  Total time for H and P, chart review, counseling, reviewing hfa/neb  device(s) and generating customized AVS unique to this office visit / same day charting = 34 min summary f/u ov

## 2023-08-07 NOTE — Patient Instructions (Addendum)
 My office will be contacting you by phone for referral to lung cancer screening   [phone]) - if you don't hear back from my office within one week,  please call us  back or notify us  thru MyChart and we'll address it right away.    Also  Ok to try albuterol 15 min before an activity (on alternating days between the inhaler, the nebulizer and nothing )  that you know would usually make you short of breath and see if it makes any difference and if makes none then don't take albuterol after activity unless you can't catch your breath as this means it's the resting that helps, not the albuterol.  If much better exertion  tolerance by using albuterol first, call for a longer acting version (Symbicort 160 or dulera 200)       Please schedule a follow up visit in 6 months but call sooner if needed

## 2023-08-07 NOTE — Assessment & Plan Note (Signed)
 Quit smoking Jan 2025 with admit [facility] [place] with chf and d/c on 02  - 05/04/2023  After extensive coaching inhaler device,  effectiveness =    60% (uneven / short ti) > continue prn saba hfa/ neb  - 05/04/2023 no desats walkng RA - PFT's  08/07/2023   FEV1 1.28 (63 % ) ratio 0.62  p 10 % improvement from saba p 0 prior to study with DLCO  13.49 (77%)   and FV curve mildly concave   - 08/07/2023  After extensive coaching inhaler device,  effectiveness =    75% hfa > continue saba prn    Pt is Group B in terms of symptom/risk and laba/lama therefore appropriate rx at this point >>>  declined for now and wants to try just using saba prn:  Re SABA :  I spent extra time with pt today reviewing appropriate use of albuterol for prn use on exertion with the following points: 1) saba is for relief of sob that does not improve by walking a slower pace or resting but rather if the pt does not improve after trying this first. 2) If the pt is convinced, as many are, that saba helps recover from activity faster then it's easy to tell if this is the case by re-challenging : ie stop, take the inhaler, then p 5 minutes try the exact same activity (intensity of workload) that just caused the symptoms and see if they are substantially diminished or not after saba 3) if there is an activity that reproducibly causes the symptoms, try the saba 15 min before the activity on alternate days   If in fact the saba really does help, then fine to continue to use it prn but advised may need to look closer at the maintenance regimen (for now zero vs rx with stiolto or anoro or bevespi) being used to achieve better control of airways disease with exertion.

## 2023-08-13 ENCOUNTER — Ambulatory Visit: Payer: Self-pay | Admitting: Internal Medicine

## 2023-09-26 DIAGNOSIS — Z95810 Presence of automatic (implantable) cardiac defibrillator: Secondary | ICD-10-CM | POA: Diagnosis not present

## 2023-09-26 DIAGNOSIS — I420 Dilated cardiomyopathy: Secondary | ICD-10-CM | POA: Diagnosis not present

## 2023-09-27 DIAGNOSIS — Z95810 Presence of automatic (implantable) cardiac defibrillator: Secondary | ICD-10-CM | POA: Diagnosis not present

## 2023-09-27 DIAGNOSIS — I509 Heart failure, unspecified: Secondary | ICD-10-CM | POA: Diagnosis not present

## 2023-11-09 ENCOUNTER — Telehealth: Payer: Self-pay | Admitting: Internal Medicine

## 2023-11-09 NOTE — Telephone Encounter (Signed)
 Copied from CRM [id number]. Topic: Complaint (DO NOT CONVERT) - Billing/Coding >> Nov 09, 2023 11:19 AM Nathanel DEL wrote: DOS: 08/07/2023 Details of complaint: pt saw Dr Darlean, and still had her [facility] insurance (but did not realize it was still in effect.  Pt was out on disability) How would the patient like to see this issue resolved? Please re bill OV to her [facility] insurance.  She did have medicare in effect as well. Please call pt when this has been completed, at pt request.  Route to Research officer, political party.

## 2023-11-27 DIAGNOSIS — I5022 Chronic systolic (congestive) heart failure: Secondary | ICD-10-CM | POA: Diagnosis not present

## 2023-11-27 DIAGNOSIS — I10 Essential (primary) hypertension: Secondary | ICD-10-CM | POA: Diagnosis not present

## 2023-12-21 DIAGNOSIS — F17210 Nicotine dependence, cigarettes, uncomplicated: Secondary | ICD-10-CM | POA: Diagnosis not present

## 2023-12-21 DIAGNOSIS — I110 Hypertensive heart disease with heart failure: Secondary | ICD-10-CM | POA: Diagnosis not present

## 2023-12-21 DIAGNOSIS — I493 Ventricular premature depolarization: Secondary | ICD-10-CM | POA: Diagnosis not present

## 2023-12-21 DIAGNOSIS — I428 Other cardiomyopathies: Secondary | ICD-10-CM | POA: Diagnosis not present

## 2023-12-21 DIAGNOSIS — Z79899 Other long term (current) drug therapy: Secondary | ICD-10-CM | POA: Diagnosis not present

## 2023-12-21 DIAGNOSIS — J45909 Unspecified asthma, uncomplicated: Secondary | ICD-10-CM | POA: Diagnosis not present

## 2023-12-21 DIAGNOSIS — I5022 Chronic systolic (congestive) heart failure: Secondary | ICD-10-CM | POA: Diagnosis not present

## 2023-12-21 DIAGNOSIS — R9431 Abnormal electrocardiogram [ECG] [EKG]: Secondary | ICD-10-CM | POA: Diagnosis not present

## 2023-12-21 DIAGNOSIS — Z4502 Encounter for adjustment and management of automatic implantable cardiac defibrillator: Secondary | ICD-10-CM | POA: Diagnosis not present

## 2023-12-26 DIAGNOSIS — Z95810 Presence of automatic (implantable) cardiac defibrillator: Secondary | ICD-10-CM | POA: Diagnosis not present

## 2023-12-26 DIAGNOSIS — I420 Dilated cardiomyopathy: Secondary | ICD-10-CM | POA: Diagnosis not present

## 2023-12-27 DIAGNOSIS — Z95810 Presence of automatic (implantable) cardiac defibrillator: Secondary | ICD-10-CM | POA: Diagnosis not present

## 2023-12-27 DIAGNOSIS — I509 Heart failure, unspecified: Secondary | ICD-10-CM | POA: Diagnosis not present
